# Patient Record
Sex: Female | Born: 1940 | Race: White | Hispanic: No | Marital: Married | Smoking: Current every day smoker
Health system: Southern US, Community
[De-identification: ages and names within clinical notes are randomized; demographics above are authoritative.]

## PROBLEM LIST (undated history)

## (undated) DIAGNOSIS — K219 Gastro-esophageal reflux disease without esophagitis: Secondary | ICD-10-CM

## (undated) DIAGNOSIS — Z9221 Personal history of antineoplastic chemotherapy: Secondary | ICD-10-CM

## (undated) DIAGNOSIS — M1990 Unspecified osteoarthritis, unspecified site: Secondary | ICD-10-CM

## (undated) DIAGNOSIS — T7840XA Allergy, unspecified, initial encounter: Secondary | ICD-10-CM

## (undated) DIAGNOSIS — Z923 Personal history of irradiation: Secondary | ICD-10-CM

## (undated) DIAGNOSIS — C50919 Malignant neoplasm of unspecified site of unspecified female breast: Secondary | ICD-10-CM

## (undated) DIAGNOSIS — K5792 Diverticulitis of intestine, part unspecified, without perforation or abscess without bleeding: Secondary | ICD-10-CM

## (undated) HISTORY — PX: BREAST BIOPSY: SHX20

## (undated) HISTORY — PX: TONSILLECTOMY: SUR1361

## (undated) HISTORY — PX: ABDOMINAL HYSTERECTOMY: SHX81

## (undated) HISTORY — PX: BREAST LUMPECTOMY: SHX2

## (undated) HISTORY — PX: KNEE ARTHROSCOPY: SHX127

## (undated) HISTORY — DX: Unspecified osteoarthritis, unspecified site: M19.90

## (undated) HISTORY — PX: APPENDECTOMY: SHX54

## (undated) HISTORY — DX: Allergy, unspecified, initial encounter: T78.40XA

## (undated) HISTORY — PX: HAND SURGERY: SHX662

---

## 1998-07-17 ENCOUNTER — Ambulatory Visit (HOSPITAL_COMMUNITY): Admission: RE | Admit: 1998-07-17 | Discharge: 1998-07-17 | Payer: Self-pay | Admitting: Gastroenterology

## 1998-10-28 ENCOUNTER — Ambulatory Visit (HOSPITAL_COMMUNITY): Admission: RE | Admit: 1998-10-28 | Discharge: 1998-10-28 | Payer: Self-pay | Admitting: Gastroenterology

## 1998-10-28 ENCOUNTER — Encounter: Payer: Self-pay | Admitting: Gastroenterology

## 2004-07-08 ENCOUNTER — Encounter: Admission: RE | Admit: 2004-07-08 | Discharge: 2004-07-08 | Payer: Self-pay | Admitting: Sports Medicine

## 2005-08-09 ENCOUNTER — Encounter: Admission: RE | Admit: 2005-08-09 | Discharge: 2005-08-09 | Payer: Self-pay | Admitting: Neurology

## 2007-04-05 ENCOUNTER — Ambulatory Visit (HOSPITAL_BASED_OUTPATIENT_CLINIC_OR_DEPARTMENT_OTHER): Admission: RE | Admit: 2007-04-05 | Discharge: 2007-04-06 | Payer: Self-pay | Admitting: Orthopedic Surgery

## 2008-10-13 HISTORY — PX: COLON SURGERY: SHX602

## 2010-07-06 NOTE — Op Note (Signed)
NAME:  Marilyn Dalton, Marilyn Dalton NO.:  [id number]   MEDICAL RECORD NO.:  [id number]          PATIENT TYPE:  AMB   LOCATION:  DSC                          FACILITY:  [facility]   PHYSICIAN:  Dionne Ano. 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   PROCEDURE:  1. CMC arthroplasty right basilar thumb joint with trapezium excision.  2. Right thumb abductor pollicis longus digastric portion tendon      transfer to the first metacarpal FCR and back upon itself with      multiple throws (Zancolli tendon transfer) right basilar thumb      joint.  3. Abductor pollicis longus proper portion tendon transfer to the FCR      and APL proper and back upon themselves with multiple figure-of-      eight throws (Weilby tendon transfer) right basilar thumb joint.  4. Abductor pollicis longus tenodesis (shortening of her wrist      extensor at the wrist forearm level to prevent dorsolateral escape)      right basilar thumb joint.   SURGEON:  Dionne Ano. Amanda Pea, M.D.   ASSISTANT:  Karie Chimera, P.A.-C.   COMPLICATIONS:  None.   ANESTHESIA:  General with preoperative block.   TOURNIQUET TIME:  Less than an hour.   INDICATIONS FOR PROCEDURE:  This patient is a pleasant female who is 70  years of age and presents with the above mentioned diagnosis.  I have  counseled her with regards to the risks and benefits of surgery  including the risks of infection, bleeding, anesthesia, damage to normal  structures, and failure of the surgery to accomplish its intended goals  of relieving symptoms and  restoring function. With this in mind, she  desires to proceed.  All questions have been encouraged and answered  preoperatively.   OPERATIVE PROCEDURE IN DETAIL:  The patient was seen by myself and  anesthesia, she was taken to the operating suite, and underwent the  smooth induction of general anesthesia.  A preop block was in excellent  working fashion.  She was given preoperative antibiotics.  Her arm was  prepped and draped in the usual sterile fashion with Betadine scrub and  paint.  The patient had a time out called, the arm had been marked, and  permit was signed and verified.  The operation then commenced with a  dorsal radial incision. Dissection was carried down. The EPL, EPB, and  APL tendons were palpated. I then created an interval between the EPB  and APL.  I very carefully protected the superficial radial nerve branch  and the radial artery which was dissected and kept out of harm's way.  The capsule was incised, a Therapist, nutritional placed on either side of  the  joint.  Following this, the trapezium was excised piecemeal.  FCR  tenolysis and tenosynovectomy was then accomplished without difficulty  and following this, I created a drill hole dorsal to palmar exiting  intra-articularly in line with the palmar beak ligament.  This was  enlarged to a 32 drill bit.  Following this, I then irrigated copiously  and this completed the trapezium excision/arthroplasty portion of the  procedure.   Following this, a transverse incision was made about the dorsal radial  distal third of the forearm.  Dissection was carried down. The digastric  portion of the EPL and the APL 1/3 proper portion were harvested.  These  were then retrieved distally at the base of the metacarpal.  Once this  was done, I then performed APL digastric portion tendon transfer to the  first metacarpal via the drill hole dorsal to palmarly.  I then placed  it around the FCR twice and through a small slit in the FCR  and then  back upon itself adjacent to the first metacarpal.  This was inset with  FiberWire and completed the Zancolli tendon transfer.  Following this, I  then placed the 1/3 proper portion APL strip through the FCR and back  through the APL proper with multiple figure-of-eight throws.  I then  secured this with FiberWire and this completed the Weilby tendon  transfer.   Following this, I then irrigated copiously, anchovied a small remnant of  tendon that was not needed into the space created and noted that the  patient had an nice tendon transfer.  Following this, I then performed a  very careful and cautious APL tenodesis.  This was performed without  difficulty. Tenodesis of the APL tendon was accomplished without  difficulty to prevent dorsolateral escape.  This was shortening of her  wrist extensor at the wrist forearm level.  Following this, I then  performed copious irrigation followed by deflation of the tourniquet.  I  obtained hemostasis with bipolar electrocautery, closed with Prolene,  she was placed in a thumb spica splint.  She will be admitted for IV  antibiotics, general postop observation, and pain management, and return  to the office to see [place] in 12 days for the standard postop protocol.  It  has been an absolute pleasure to see her and treat her today and we wish  her the best for her postoperative period.           ______________________________  Dionne Ano. Everlene Other, M.D.     Nash Mantis  D:  04/05/2007  T:  04/06/2007  Job:  [id number]

## 2010-11-12 LAB — POCT HEMOGLOBIN-HEMACUE: Hemoglobin: 15.9 — ABNORMAL HIGH

## 2013-06-20 ENCOUNTER — Ambulatory Visit: Payer: Medicare Other

## 2013-06-20 ENCOUNTER — Ambulatory Visit (INDEPENDENT_AMBULATORY_CARE_PROVIDER_SITE_OTHER): Payer: Medicare Other | Admitting: Internal Medicine

## 2013-06-20 VITALS — BP 122/84 | HR 90 | Temp 98.0°F | Resp 16 | Ht 65.0 in | Wt 114.8 lb

## 2013-06-20 DIAGNOSIS — R05 Cough: Secondary | ICD-10-CM

## 2013-06-20 DIAGNOSIS — R059 Cough, unspecified: Secondary | ICD-10-CM

## 2013-06-20 DIAGNOSIS — F17200 Nicotine dependence, unspecified, uncomplicated: Secondary | ICD-10-CM

## 2013-06-20 DIAGNOSIS — R079 Chest pain, unspecified: Secondary | ICD-10-CM

## 2013-06-20 DIAGNOSIS — J45909 Unspecified asthma, uncomplicated: Secondary | ICD-10-CM

## 2013-06-20 LAB — POCT CBC
Granulocyte percent: 60.4 %G (ref 37–80)
HCT, POC: 48 % — AB (ref 37.7–47.9)
Hemoglobin: 15.9 g/dL (ref 12.2–16.2)
Lymph, poc: 2.3 (ref 0.6–3.4)
MCH, POC: 34 pg — AB (ref 27–31.2)
MCHC: 33.1 g/dL (ref 31.8–35.4)
MCV: 102.6 fL — AB (ref 80–97)
MID (cbc): 0.4 (ref 0–0.9)
MPV: 8.2 fL (ref 0–99.8)
POC Granulocyte: 4.2 (ref 2–6.9)
POC LYMPH PERCENT: 33.5 %L (ref 10–50)
POC MID %: 6.1 %M (ref 0–12)
Platelet Count, POC: 324 10*3/uL (ref 142–424)
RBC: 4.68 M/uL (ref 4.04–5.48)
RDW, POC: 13.9 %
WBC: 7 10*3/uL (ref 4.6–10.2)

## 2013-06-20 MED ORDER — PREDNISONE 10 MG PO TABS: ORAL_TABLET | ORAL | Status: DC

## 2013-06-20 MED ORDER — IPRATROPIUM BROMIDE 0.02 % IN SOLN
0.5000 mg | Freq: Once | RESPIRATORY_TRACT | Status: AC
  Administered 2013-06-20: 0.5 mg via RESPIRATORY_TRACT

## 2013-06-20 MED ORDER — HYDROCODONE-ACETAMINOPHEN 7.5-325 MG/15ML PO SOLN: 5.0000 mL | Freq: Four times a day (QID) | ORAL | Status: DC | PRN

## 2013-06-20 MED ORDER — AZITHROMYCIN 500 MG PO TABS: 500.0000 mg | ORAL_TABLET | Freq: Every day | ORAL | Status: DC

## 2013-06-20 MED ORDER — ALBUTEROL SULFATE (2.5 MG/3ML) 0.083% IN NEBU
2.5000 mg | INHALATION_SOLUTION | Freq: Once | RESPIRATORY_TRACT | Status: AC
  Administered 2013-06-20: 2.5 mg via RESPIRATORY_TRACT

## 2013-06-20 NOTE — Patient Instructions (Addendum)
Sinusitis Sinusitis is redness, soreness, and swelling (inflammation) of the paranasal sinuses. Paranasal sinuses are air pockets within the bones of your face (beneath the eyes, the middle of the forehead, or above the eyes). In healthy paranasal sinuses, mucus is able to drain out, and air is able to circulate through them by way of your nose. However, when your paranasal sinuses are inflamed, mucus and air can become trapped. This can allow bacteria and other germs to grow and cause infection. Sinusitis can develop quickly and last only a short time (acute) or continue over a long period (chronic). Sinusitis that lasts for more than 12 weeks is considered chronic.  CAUSES  Causes of sinusitis include:  Allergies.  Structural abnormalities, such as displacement of the cartilage that separates your nostrils (deviated septum), which can decrease the air flow through your nose and sinuses and affect sinus drainage.  Functional abnormalities, such as when the small hairs (cilia) that line your sinuses and help remove mucus do not work properly or are not present. SYMPTOMS  Symptoms of acute and chronic sinusitis are the same. The primary symptoms are pain and pressure around the affected sinuses. Other symptoms include:  Upper toothache.  Earache.  Headache.  Bad breath.  Decreased sense of smell and taste.  A cough, which worsens when you are lying flat.  Fatigue.  Fever.  Thick drainage from your nose, which often is green and may contain pus (purulent).  Swelling and warmth over the affected sinuses. DIAGNOSIS  Your caregiver will perform a physical exam. During the exam, your caregiver may:  Look in your nose for signs of abnormal growths in your nostrils (nasal polyps).  Tap over the affected sinus to check for signs of infection.  View the inside of your sinuses (endoscopy) with a special imaging device with a light attached (endoscope), which is inserted into your  sinuses. If your caregiver suspects that you have chronic sinusitis, one or more of the following tests may be recommended:  Allergy tests.  Nasal culture A sample of mucus is taken from your nose and sent to a lab and screened for bacteria.  Nasal cytology A sample of mucus is taken from your nose and examined by your caregiver to determine if your sinusitis is related to an allergy. TREATMENT  Most cases of acute sinusitis are related to a viral infection and will resolve on their own within 10 days. Sometimes medicines are prescribed to help relieve symptoms (pain medicine, decongestants, nasal steroid sprays, or saline sprays).  However, for sinusitis related to a bacterial infection, your caregiver will prescribe antibiotic medicines. These are medicines that will help kill the bacteria causing the infection.  Rarely, sinusitis is caused by a fungal infection. In theses cases, your caregiver will prescribe antifungal medicine. For some cases of chronic sinusitis, surgery is needed. Generally, these are cases in which sinusitis recurs more than 3 times per year, despite other treatments. HOME CARE INSTRUCTIONS   Drink plenty of water. Water helps thin the mucus so your sinuses can drain more easily.  Use a humidifier.  Inhale steam 3 to 4 times a day (for example, sit in the bathroom with the shower running).  Apply a warm, moist washcloth to your face 3 to 4 times a day, or as directed by your caregiver.  Use saline nasal sprays to help moisten and clean your sinuses.  Take over-the-counter or prescription medicines for pain, discomfort, or fever only as directed by your caregiver. SEEK IMMEDIATE MEDICAL   CARE IF:  You have increasing pain or severe headaches.  You have nausea, vomiting, or drowsiness.  You have swelling around your face.  You have vision problems.  You have a stiff neck.  You have difficulty breathing. MAKE SURE YOU:   Understand these  instructions.  Will watch your condition.  Will get help right away if you are not doing well or get worse. Document Released: 02/07/2005 Document Revised: 05/02/2011 Document Reviewed: 02/22/2011 [facility] Patient Information 2014 [place], [place]. Acute Bronchitis Bronchitis is inflammation of the airways that extend from the windpipe into the lungs (bronchi). The inflammation often causes mucus to develop. This leads to a cough, which is the most common symptom of bronchitis.  In acute bronchitis, the condition usually develops suddenly and goes away over time, usually in a couple weeks. Smoking, allergies, and asthma can make bronchitis worse. Repeated episodes of bronchitis may cause further lung problems.  CAUSES Acute bronchitis is most often caused by the same virus that causes a cold. The virus can spread from person to person (contagious).  SIGNS AND SYMPTOMS   Cough.   Fever.   Coughing up mucus.   Body aches.   Chest congestion.   Chills.   Shortness of breath.   Sore throat.  DIAGNOSIS  Acute bronchitis is usually diagnosed through a physical exam. Tests, such as chest X-rays, are sometimes done to rule out other conditions.  TREATMENT  Acute bronchitis usually goes away in a couple weeks. Often times, no medical treatment is necessary. Medicines are sometimes given for relief of fever or cough. Antibiotics are usually not needed but may be prescribed in certain situations. In some cases, an inhaler may be recommended to help reduce shortness of breath and control the cough. A cool mist vaporizer may also be used to help thin bronchial secretions and make it easier to clear the chest.  HOME CARE INSTRUCTIONS  Get plenty of rest.   Drink enough fluids to keep your urine clear or pale yellow (unless you have a medical condition that requires fluid restriction). Increasing fluids may help thin your secretions and will prevent dehydration.   Only take  over-the-counter or prescription medicines as directed by your health care provider.   Avoid smoking and secondhand smoke. Exposure to cigarette smoke or irritating chemicals will make bronchitis worse. If you are a smoker, consider using nicotine gum or skin patches to help control withdrawal symptoms. Quitting smoking will help your lungs heal faster.   Reduce the chances of another bout of acute bronchitis by washing your hands frequently, avoiding people with cold symptoms, and trying not to touch your hands to your mouth, nose, or eyes.   Follow up with your health care provider as directed.  SEEK MEDICAL CARE IF: Your symptoms do not improve after 1 week of treatment.  SEEK IMMEDIATE MEDICAL CARE IF:  You develop an increased fever or chills.   You have chest pain.   You have severe shortness of breath.  You have bloody sputum.   You develop dehydration.  You develop fainting.  You develop repeated vomiting.  You develop a severe headache. MAKE SURE YOU:   Understand these instructions.  Will watch your condition.  Will get help right away if you are not doing well or get worse. Document Released: 03/17/2004 Document Revised: 10/10/2012 Document Reviewed: 07/31/2012 [facility] Patient Information 2014 [place]. Smoking Cessation Quitting smoking is important to your health and has many advantages. However, it is not always easy to  quit since nicotine is a very addictive drug. Often times, people try 3 times or more before being able to quit. This document explains the best ways for you to prepare to quit smoking. Quitting takes hard work and a lot of effort, but you can do it. ADVANTAGES OF QUITTING SMOKING  You will live longer, feel better, and live better.  Your body will feel the impact of quitting smoking almost immediately.  Within 20 minutes, blood pressure decreases. Your pulse returns to its normal level.  After 8 hours, carbon monoxide levels  in the blood return to normal. Your oxygen level increases.  After 24 hours, the chance of having a heart attack starts to decrease. Your breath, hair, and body stop smelling like smoke.  After 48 hours, damaged nerve endings begin to recover. Your sense of taste and smell improve.  After 72 hours, the body is virtually free of nicotine. Your bronchial tubes relax and breathing becomes easier.  After 2 to 12 weeks, lungs can hold more air. Exercise becomes easier and circulation improves.  The risk of having a heart attack, stroke, cancer, or lung disease is greatly reduced.  After 1 year, the risk of coronary heart disease is cut in half.  After 5 years, the risk of stroke falls to the same as a nonsmoker.  After 10 years, the risk of lung cancer is cut in half and the risk of other cancers decreases significantly.  After 15 years, the risk of coronary heart disease drops, usually to the level of a nonsmoker.  If you are pregnant, quitting smoking will improve your chances of having a healthy baby.  The people you live with, especially any children, will be healthier.  You will have extra money to spend on things other than cigarettes. QUESTIONS TO THINK ABOUT BEFORE ATTEMPTING TO QUIT You may want to talk about your answers with your caregiver.  Why do you want to quit?  If you tried to quit in the past, what helped and what did not?  What will be the most difficult situations for you after you quit? How will you plan to handle them?  Who can help you through the tough times? Your family? Friends? A caregiver?  What pleasures do you get from smoking? What ways can you still get pleasure if you quit? Here are some questions to ask your caregiver:  How can you help me to be successful at quitting?  What medicine do you think would be best for me and how should I take it?  What should I do if I need more help?  What is smoking withdrawal like? How can I get information on  withdrawal? GET READY  Set a quit date.  Change your environment by getting rid of all cigarettes, ashtrays, matches, and lighters in your home, car, or work. Do not let people smoke in your home.  Review your past attempts to quit. Think about what worked and what did not. GET SUPPORT AND ENCOURAGEMENT You have a better chance of being successful if you have help. You can get support in many ways.  Tell your family, friends, and co-workers that you are going to quit and need their support. Ask them not to smoke around you.  Get individual, group, or telephone counseling and support. Programs are available at [employer] and health centers. Call your local health department for information about programs in your area.  Spiritual beliefs and practices may help some smokers quit.  Download a "  quit meter" on your computer to keep track of quit statistics, such as how long you have gone without smoking, cigarettes not smoked, and money saved.  Get a self-help book about quitting smoking and staying off of tobacco. [place] yourself from urges to smoke. Talk to someone, go for a walk, or occupy your time with a task.  Change your normal routine. Take a different route to work. Drink tea instead of coffee. Eat breakfast in a different place.  Reduce your stress. Take a hot bath, exercise, or read a book.  Plan something enjoyable to do every day. Reward yourself for not smoking.  Explore interactive web-based programs that specialize in helping you quit. GET MEDICINE AND USE IT CORRECTLY Medicines can help you stop smoking and decrease the urge to smoke. Combining medicine with the above behavioral methods and support can greatly increase your chances of successfully quitting smoking.  Nicotine replacement therapy helps deliver nicotine to your body without the negative effects and risks of smoking. Nicotine replacement therapy includes nicotine gum,  lozenges, inhalers, nasal sprays, and skin patches. Some may be available over-the-counter and others require a prescription.  Antidepressant medicine helps people abstain from smoking, but how this works is unknown. This medicine is available by prescription.  Nicotinic receptor partial agonist medicine simulates the effect of nicotine in your brain. This medicine is available by prescription. Ask your caregiver for advice about which medicines to use and how to use them based on your health history. Your caregiver will tell you what side effects to look out for if you choose to be on a medicine or therapy. Carefully read the information on the package. Do not use any other product containing nicotine while using a nicotine replacement product.  RELAPSE OR DIFFICULT SITUATIONS Most relapses occur within the first 3 months after quitting. Do not be discouraged if you start smoking again. Remember, most people try several times before finally quitting. You may have symptoms of withdrawal because your body is used to nicotine. You may crave cigarettes, be irritable, feel very hungry, cough often, get headaches, or have difficulty concentrating. The withdrawal symptoms are only temporary. They are strongest when you first quit, but they will go away within 10 14 days. To reduce the chances of relapse, try to:  Avoid drinking alcohol. Drinking lowers your chances of successfully quitting.  Reduce the amount of caffeine you consume. Once you quit smoking, the amount of caffeine in your body increases and can give you symptoms, such as a rapid heartbeat, sweating, and anxiety.  Avoid smokers because they can make you want to smoke.  Do not let weight gain distract you. Many smokers will gain weight when they quit, usually less than 10 pounds. Eat a healthy diet and stay active. You can always lose the weight gained after you quit.  Find ways to improve your mood other than smoking. FOR MORE INFORMATION   www.smokefree.gov  Document Released: 02/01/2001 Document Revised: 08/09/2011 Document Reviewed: 05/19/2011 [facility] Patient Information 2014 [place], [place]. Chronic Obstructive Pulmonary Disease Chronic obstructive pulmonary disease (COPD) is a common lung condition in which airflow from the lungs is limited. COPD is a general term that can be used to describe many different lung problems that limit airflow, including both chronic bronchitis and emphysema. If you have COPD, your lung function will probably never return to normal, but there are measures you can take to improve lung function and make yourself feel better.  CAUSES  Smoking (common).   Exposure to secondhand smoke.   Genetic problems.  Chronic inflammatory lung diseases or recurrent infections. SYMPTOMS   Shortness of breath, especially with physical activity.   Deep, persistent (chronic) cough with a large amount of thick mucus.   Wheezing.   Rapid breaths (tachypnea).   Gray or bluish discoloration (cyanosis) of the skin, especially in fingers, toes, or lips.   Fatigue.   Weight loss.   Frequent infections or episodes when breathing symptoms become much worse (exacerbations).   Chest tightness. DIAGNOSIS  Your healthcare provider will take a medical history and perform a physical examination to make the initial diagnosis. Additional tests for COPD may include:   Lung (pulmonary) function tests.  Chest X-ray.  CT scan.  Blood tests. TREATMENT  Treatment available to help you feel better when you have COPD include:   Inhaler and nebulizer medicines. These help manage the symptoms of COPD and make your breathing more comfortable  Supplemental oxygen. Supplemental oxygen is only helpful if you have a low oxygen level in your blood.   Exercise and physical activity. These are beneficial for nearly all people with COPD. Some people may also benefit from a pulmonary rehabilitation  program. HOME CARE INSTRUCTIONS   Take all medicines (inhaled or pills) as directed by your health care provider.  Only take over-the-counter or prescription medicines for pain, fever, or discomfort as directed by your health care provider.   Avoid over-the-counter medicines or cough syrups that dry up your airway (such as antihistamines) and slow down the elimination of secretions unless instructed otherwise by your healthcare provider.   If you are a smoker, the most important thing that you can do is stop smoking. Continuing to smoke will cause further lung damage and breathing trouble. Ask your health care provider for help with quitting smoking. He or she can direct you to community resources or hospitals that provide support.  Avoid exposure to irritants such as smoke, chemicals, and fumes that aggravate your breathing.  Use oxygen therapy and pulmonary rehabilitation if directed by your health care provider. If you require home oxygen therapy, ask your healthcare provider whether you should purchase a pulse oximeter to measure your oxygen level at home.   Avoid contact with individuals who have a contagious illness.  Avoid extreme temperature and humidity changes.  Eat healthy foods. Eating smaller, more frequent meals and resting before meals may help you maintain your strength.  Stay active, but balance activity with periods of rest. Exercise and physical activity will help you maintain your ability to do things you want to do.  Preventing infection and hospitalization is very important when you have COPD. Make sure to receive all the vaccines your health care provider recommends, especially the pneumococcal and influenza vaccines. Ask your healthcare provider whether you need a pneumonia vaccine.  Learn and use relaxation techniques to manage stress.  Learn and use controlled breathing techniques as directed by your health care provider. Controlled breathing techniques  include:   Pursed lip breathing. Start by breathing in (inhaling) through your nose for 1 second. Then, purse your lips as if you were going to whistle and breathe out (exhale) through the pursed lips for 2 seconds.   Diaphragmatic breathing. Start by putting one hand on your abdomen just above your waist. Inhale slowly through your nose. The hand on your abdomen should move out. Then purse your lips and exhale slowly. You should be able to feel the hand on your abdomen moving  in as you exhale.   Learn and use controlled coughing to clear mucus from your lungs. Controlled coughing is a series of short, progressive coughs. The steps of controlled coughing are:  1. Lean your head slightly forward.  2. Breathe in deeply using diaphragmatic breathing.  3. Try to hold your breath for 3 seconds.  4. Keep your mouth slightly open while coughing twice.  5. Spit any mucus out into a tissue.  6. Rest and repeat the steps once or twice as needed. SEEK MEDICAL CARE IF:   You are coughing up more mucus than usual.   There is a change in the color or thickness of your mucus.   Your breathing is more labored than usual.   Your breathing is faster than usual.  SEEK IMMEDIATE MEDICAL CARE IF:   You have shortness of breath while you are resting.   You have shortness of breath that prevents you from:  Being able to talk.   Performing your usual physical activities.   You have chest pain lasting longer than 5 minutes.   Your skin color is more cyanotic than usual.  You measure low oxygen saturations for longer than 5 minutes with a pulse oximeter. MAKE SURE YOU:   Understand these instructions.  Will watch your condition.  Will get help right away if you are not doing well or get worse. Document Released: 11/17/2004 Document Revised: 11/28/2012 Document Reviewed: 10/04/2012 [facility] Patient Information 2014 [place], [place].

## 2013-06-20 NOTE — Progress Notes (Signed)
   Subjective:    Patient ID: Marilyn Dalton, female    DOB: Nov 17, 1940, 73 y.o.   MRN: [id number]  HPI 73 year old female complains of head and chest congestion x 12 days. Coughing up yellow mucus. Has had mild fever on on and off. She smokes a pack a day of cigarettes. Left ear is sore. Sinus pain and pressure. Has chest pain with coughing and wheezing. No hx of heart disease, diabetes, or htn. Works out every day.   Review of Systems     Objective:   Physical Exam  Vitals reviewed. Constitutional: She is oriented to person, place, and time. She appears well-developed and well-nourished. No distress.  HENT:  Head: Normocephalic.  Right Ear: External ear normal.  Left Ear: External ear normal.  Nose: Mucosal edema, rhinorrhea and sinus tenderness present. No nasal deformity. No epistaxis. Right sinus exhibits maxillary sinus tenderness. Right sinus exhibits no frontal sinus tenderness. Left sinus exhibits maxillary sinus tenderness. Left sinus exhibits no frontal sinus tenderness.  Mouth/Throat: Uvula is midline. Posterior oropharyngeal erythema present.  Eyes: EOM are normal.  Neck: Normal range of motion. Neck supple.  Cardiovascular: Normal rate, regular rhythm and normal heart sounds.   Pulmonary/Chest: Effort normal. Not tachypneic. She has decreased breath sounds. She has wheezes. She has rhonchi. She has no rales. She exhibits tenderness.  Musculoskeletal: Normal range of motion.  Neurological: She is alert and oriented to person, place, and time. She exhibits normal muscle tone. Coordination normal.  Psychiatric: She has a normal mood and affect. Her behavior is normal.   [facility] reading (PRIMARY) by  Dr Elder Cyphers no infiltrate, hyper inflation suggestive of copd  Results for orders placed in visit on 06/20/13  POCT CBC      Result Value Ref Range   WBC 7.0  4.6 - 10.2 K/uL   Lymph, poc 2.3  0.6 - 3.4   POC LYMPH PERCENT 33.5  10 - 50 %L   MID (cbc) 0.4  0 - 0.9   POC MID %  6.1  0 - 12 %M   POC Granulocyte 4.2  2 - 6.9   Granulocyte percent 60.4  37 - 80 %G   RBC 4.68  4.04 - 5.48 M/uL   Hemoglobin 15.9  12.2 - 16.2 g/dL   HCT, POC 48.0 (*) 37.7 - 47.9 %   MCV 102.6 (*) 80 - 97 fL   MCH, POC 34.0 (*) 27 - 31.2 pg   MCHC 33.1  31.8 - 35.4 g/dL   RDW, POC 13.9     Platelet Count, POC 324  142 - 424 K/uL   MPV 8.2  0 - 99.8 fL   PFR 300 l/min Nebulizer tx Post neb 300l/min        Assessment & Plan:  Zithromax 538mf 5d/Lortab elixir/Prednisone Smoking cessation See dr. Tollie Pizza to evaluate lungs further

## 2014-06-09 ENCOUNTER — Other Ambulatory Visit: Payer: Self-pay | Admitting: Family Medicine

## 2014-06-09 DIAGNOSIS — N6315 Unspecified lump in the right breast, overlapping quadrants: Secondary | ICD-10-CM

## 2014-06-09 DIAGNOSIS — N6310 Unspecified lump in the right breast, unspecified quadrant: Principal | ICD-10-CM

## 2014-06-12 ENCOUNTER — Ambulatory Visit
Admission: RE | Admit: 2014-06-12 | Discharge: 2014-06-12 | Disposition: A | Discharge status: Home / Self Care | Payer: Medicare Other | Source: Ambulatory Visit | Attending: Family Medicine | Admitting: Family Medicine

## 2014-06-12 ENCOUNTER — Other Ambulatory Visit: Payer: Self-pay | Admitting: Family Medicine

## 2014-06-12 DIAGNOSIS — N6315 Unspecified lump in the right breast, overlapping quadrants: Secondary | ICD-10-CM

## 2014-06-12 DIAGNOSIS — N6310 Unspecified lump in the right breast, unspecified quadrant: Principal | ICD-10-CM

## 2014-06-16 ENCOUNTER — Other Ambulatory Visit: Payer: Self-pay | Admitting: Family Medicine

## 2014-06-16 DIAGNOSIS — N6310 Unspecified lump in the right breast, unspecified quadrant: Principal | ICD-10-CM

## 2014-06-16 DIAGNOSIS — N6315 Unspecified lump in the right breast, overlapping quadrants: Secondary | ICD-10-CM

## 2014-06-20 ENCOUNTER — Ambulatory Visit
Admission: RE | Admit: 2014-06-20 | Discharge: 2014-06-20 | Disposition: A | Discharge status: Home / Self Care | Payer: Medicare Other | Source: Ambulatory Visit | Attending: Family Medicine | Admitting: Family Medicine

## 2014-06-20 DIAGNOSIS — N6310 Unspecified lump in the right breast, unspecified quadrant: Principal | ICD-10-CM

## 2014-06-20 DIAGNOSIS — N6315 Unspecified lump in the right breast, overlapping quadrants: Secondary | ICD-10-CM

## 2014-06-20 DIAGNOSIS — C50919 Malignant neoplasm of unspecified site of unspecified female breast: Secondary | ICD-10-CM

## 2014-06-20 HISTORY — DX: Malignant neoplasm of unspecified site of unspecified female breast: C50.919

## 2014-06-20 HISTORY — PX: BREAST BIOPSY: SHX20

## 2014-06-23 ENCOUNTER — Other Ambulatory Visit: Payer: Self-pay | Admitting: Family Medicine

## 2014-06-23 DIAGNOSIS — D0511 Intraductal carcinoma in situ of right breast: Secondary | ICD-10-CM

## 2014-06-23 DIAGNOSIS — C50911 Malignant neoplasm of unspecified site of right female breast: Secondary | ICD-10-CM

## 2014-06-30 ENCOUNTER — Ambulatory Visit
Admission: RE | Admit: 2014-06-30 | Discharge: 2014-06-30 | Disposition: A | Discharge status: Home / Self Care | Payer: Medicare Other | Source: Ambulatory Visit | Attending: Family Medicine | Admitting: Family Medicine

## 2014-06-30 DIAGNOSIS — C50911 Malignant neoplasm of unspecified site of right female breast: Secondary | ICD-10-CM

## 2014-06-30 DIAGNOSIS — D0511 Intraductal carcinoma in situ of right breast: Secondary | ICD-10-CM

## 2014-06-30 MED ORDER — GADOBENATE DIMEGLUMINE 529 MG/ML IV SOLN
9.0000 mL | Freq: Once | INTRAVENOUS | Status: AC | PRN
  Administered 2014-06-30: 9 mL via INTRAVENOUS

## 2014-07-02 ENCOUNTER — Telehealth: Payer: Self-pay | Admitting: *Deleted

## 2014-07-02 ENCOUNTER — Other Ambulatory Visit: Payer: Self-pay | Admitting: General Surgery

## 2014-07-02 NOTE — Telephone Encounter (Signed)
Called pt and confirmed 07/04/14 med onc appt.  Unable to mail before appt letter - gave verbal.  Unable to mail welcoming packet - gave directions and instructions.  Unable to mail intake form - placed a note for one to be given at time of check in.  Emailed Engineer, civil (consulting) at [employer] to make her aware.  Placed a copy of records in Dr. Geralyn Flash box and took on to HIM to scan.

## 2014-07-03 DIAGNOSIS — C50211 Malignant neoplasm of upper-inner quadrant of right female breast: Secondary | ICD-10-CM | POA: Insufficient documentation

## 2014-07-03 NOTE — Progress Notes (Signed)
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  Receptor Status: ER(+90% ), PR ( neg 1%,) Her2-neu ( neg.+ Ki-67 20% )  Did patient present with symptoms (if so, please note symptoms) or was this found on screening mammography?: patient noticed lump in breast 2 months,   Past/Anticipated interventions by surgeon, if any: to see Dr. [name]  For port acath placement this Friday 07/11/14  Past/Anticipated interventions by medical oncology, if any: Chemotherapy : Dr. Jennette Kettle , MD seen 07/04/14, Chemo education done 07/08/14 and Echocardiogram as well, follow up 07/14/14 , Chemotherapy to start Monday 07/14/14   Lymphedema issues, if any:   none  Pain issues, if any:  none  SAFETY ISSUES:  Prior radiation? No  Pacemaker/ICD? NO  Possible current pregnancy? NO  Is the patient on methotrexate?NO  Current Complaints / other details:  Married, GxP4,  Menarche age 47 , 50st live birth age 62, Birth control pills 20 years,no HRT,  Current  Cigarette  Smoker, 1/ppd, 40 years, smokeless tobacco, drinks alcohol, occasionally Allergies: NKA  Roslynn Amble, Felicita Gage, RN 07/03/2014,5:35 PM

## 2014-07-03 NOTE — Assessment & Plan Note (Signed)
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 After reviewing the pathology in detail, we proceeded to discuss the different treatment options between surgery, radiation, chemotherapy, antiestrogen therapies.  Recommendation based on multidisciplinary tumor board: 1. Neoadjuvant chemotherapy with Abraxane Herceptin weekly 12 followed by Herceptin maintenance every 3 weeks for 1 year 2. After initial 12 weeks, breast surgery 3. Followed by radiation therapy 4. Followed by antiestrogen therapy with aromatase inhibitor  Chemotherapy counseling: I discussed risks and benefits of chemotherapy including the Herceptin related reversible cardiomyopathy risk, diarrhea, risk of Abraxane from neuropathy, decrease in blood counts, nausea vomiting, hair loss as well as changes in liver and kidney function. Patient understands this risk and consented to proceed with the treatment.  Plan: 1. Echocardiogram 2. Chemotherapy class 3. Port placement And then start chemotherapy  Return to clinic with the start of chemotherapy.

## 2014-07-04 ENCOUNTER — Ambulatory Visit: Payer: Medicare Other

## 2014-07-04 ENCOUNTER — Telehealth: Payer: Self-pay | Admitting: Hematology and Oncology

## 2014-07-04 ENCOUNTER — Ambulatory Visit (HOSPITAL_BASED_OUTPATIENT_CLINIC_OR_DEPARTMENT_OTHER): Payer: Medicare Other | Admitting: Hematology and Oncology

## 2014-07-04 ENCOUNTER — Encounter: Payer: Self-pay | Admitting: *Deleted

## 2014-07-04 ENCOUNTER — Other Ambulatory Visit: Payer: Self-pay | Admitting: *Deleted

## 2014-07-04 ENCOUNTER — Encounter: Payer: Self-pay | Admitting: Hematology and Oncology

## 2014-07-04 VITALS — BP 144/92 | HR 85 | Temp 98.0°F | Resp 18 | Ht 65.0 in | Wt 107.5 lb

## 2014-07-04 DIAGNOSIS — C50211 Malignant neoplasm of upper-inner quadrant of right female breast: Secondary | ICD-10-CM

## 2014-07-04 DIAGNOSIS — C50811 Malignant neoplasm of overlapping sites of right female breast: Secondary | ICD-10-CM

## 2014-07-04 DIAGNOSIS — Z9189 Other specified personal risk factors, not elsewhere classified: Secondary | ICD-10-CM

## 2014-07-04 DIAGNOSIS — Z79899 Other long term (current) drug therapy: Secondary | ICD-10-CM

## 2014-07-04 DIAGNOSIS — Z5181 Encounter for therapeutic drug level monitoring: Secondary | ICD-10-CM

## 2014-07-04 NOTE — Progress Notes (Signed)
[facility] CONSULT NOTE  Patient Care Team: No Pcp Per Patient as PCP - General (General Practice)  CHIEF COMPLAINTS/PURPOSE OF CONSULTATION:  Newly diagnosed breast cancer  HISTORY OF PRESENTING ILLNESS:  Marilyn Dalton 74 y.o. female is here because of recent diagnosis of right breast cancer. Patient was sunbathing and felt a lump in the right breast for the last 2 months. A few weeks later she went to see her primary care doctor who referred her for a breast center. She underwent a mammogram that revealed a mass in the right breast and ultrasound. 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 Ki-67 was 20%. She underwent a breast MRI that revealed a 2.4 cm irregular mass in the right breast. She was referred to [place] for consideration for neoadjuvant chemotherapy. She was presented at the multidisciplinary tumor board.  I reviewed her records extensively and collaborated the history with the patient.  SUMMARY OF ONCOLOGIC HISTORY:   Breast cancer of upper-inner quadrant of right female breast   06/20/2014 Initial Diagnosis right breast 3:00: Invasive ductal carcinoma with DCIS, grade 2,ER 90%, PR 1%,HER-2 positive ratio 2.97   06/30/2014 Breast MRI Right breast 2.4 x 2.2 x 2.2 cm irregular mass, no abnormal lymph nodes    In terms of breast cancer risk profile:  She menarched at early age of 1 and went to menopause at age 46  She had 4 pregnancy, her first child was born at age 37  She has received birth control pills for approximately 20 years.  She was never exposed to fertility medications or hormone replacement therapy.  She does not have family history of Breast/GYN/GI cancer  MEDICAL HISTORY:  Past Medical History  Diagnosis Date  . Allergy   . Arthritis     SURGICAL HISTORY: Past Surgical History  Procedure Laterality Date  . Colon surgery      SOCIAL HISTORY: History   Social  History  . Marital Status: Married    Spouse Name: N/A  . Number of Children: N/A  . Years of Education: N/A   Occupational History  . Not on file.   Social History Main Topics  . Smoking status: Current Every Day Smoker  . Smokeless tobacco: Not on file  . Alcohol Use: Yes  . Drug Use: Not on file  . Sexual Activity: Not on file   Other Topics Concern  . Not on file   Social History Narrative  . No narrative on file    FAMILY HISTORY: No family history on file.  ALLERGIES:  has No Known Allergies.  MEDICATIONS:  Current Outpatient Prescriptions  Medication Sig Dispense Refill  . azithromycin (ZITHROMAX) 500 MG tablet Take 1 tablet (500 mg total) by mouth daily. 5 tablet 0  . HYDROcodone-acetaminophen (HYCET) 7.5-325 mg/15 ml solution Take 5 mLs by mouth every 6 (six) hours as needed (or cough). 240 mL 0  . predniSONE (DELTASONE) 10 MG tablet 6-5-4-3-2-1 po pc for breathing 21 tablet 0   No current facility-administered medications for this visit.    REVIEW OF SYSTEMS:   Constitutional: Denies fevers, chills or abnormal night sweats Eyes: Denies blurriness of vision, double vision or watery eyes Ears, nose, mouth, throat, and face: Denies mucositis or sore throat Respiratory: Denies cough, dyspnea or wheezes Cardiovascular: Denies palpitation, chest discomfort or lower extremity swelling Gastrointestinal:  Denies nausea, heartburn or change in bowel habits Skin: Denies abnormal skin rashes  Lymphatics: Denies new lymphadenopathy or easy bruising Neurological:Denies numbness, tingling or new weaknesses Behavioral/Psych: Mood is stable, no new changes  Breast: Palpable lump in the right breast with very small breasts All other systems were reviewed with the patient and are negative.  PHYSICAL EXAMINATION: ECOG PERFORMANCE STATUS: 0 - Asymptomatic  Filed Vitals:   07/04/14 0828  BP: 144/92  Pulse: 85  Temp: 98 F (36.7 C)  Resp: 18   Filed Weights    07/04/14 0828  Weight: 107 lb 8 oz (48.762 kg)    GENERAL:alert, no distress and comfortable SKIN: skin color, texture, turgor are normal, no rashes or significant lesions EYES: normal, conjunctiva are pink and non-injected, sclera clear OROPHARYNX:no exudate, no erythema and lips, buccal mucosa, and tongue normal  NECK: supple, thyroid normal size, non-tender, without nodularity LYMPH:  no palpable lymphadenopathy in the cervical, axillary or inguinal LUNGS: clear to auscultation and percussion with normal breathing effort HEART: regular rate & rhythm and no murmurs and no lower extremity edema ABDOMEN:abdomen soft, non-tender and normal bowel sounds Musculoskeletal:no cyanosis of digits and no clubbing  PSYCH: alert & oriented x 3 with fluent speech NEURO: no focal motor/sensory deficits BREAST: Large palpable mass in the right breast retroareolar area about the upper outer quadrant. No palpable axillary or supraclavicular lymphadenopathy (exam performed in the presence of a chaperone)   LABORATORY DATA:  I have reviewed the data as listed Lab Results  Component Value Date   WBC 7.0 06/20/2013   HGB 15.9 06/20/2013   HCT 48.0* 06/20/2013   MCV 102.6* 06/20/2013   No results found for: NA, K, CL, CO2  RADIOGRAPHIC STUDIES: I have personally reviewed the radiological reports and agreed with the findings in the report. 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 After reviewing the pathology in detail, we proceeded to discuss the different treatment options between surgery, radiation, chemotherapy, antiestrogen therapies.  Recommendation based on multidisciplinary tumor board: 1. Neoadjuvant chemotherapy with Taxotere, carboplatin, Herceptin, Perjeta every 3 weeks 6 cycles followed by Herceptin maintenance every 3 weeks for 1 year 2. After 6 cycles of chemotherapy, breast surgery 3. Followed by radiation therapy 4. Followed by antiestrogen therapy with aromatase inhibitor  Chemotherapy counseling: I discussed risks and benefits of chemotherapy including the Herceptin related reversible cardiomyopathy risk, diarrhea, risk of Abraxane from neuropathy, decrease in blood counts, nausea vomiting, hair loss as well as changes in liver and kidney function. Patient understands this risk and consented to proceed with the treatment.  Plan: 1. Echocardiogram 2. Chemotherapy class 3. Port placement And then start chemotherapy May 23  Return to clinic with the start of chemotherapy.  All questions were answered. The patient knows to call the clinic with any problems, questions or concerns.    Rulon Eisenmenger, MD 8:31 AM

## 2014-07-04 NOTE — Progress Notes (Signed)
Met with patient at new patient visit with Dr. Lindi Adie.  Discussed navigation resources and care plan with patient.  Contact information given and encouraged her to call with any questions or concerns.

## 2014-07-04 NOTE — Progress Notes (Signed)
Checked in new pt with no financial concerns.  Pt has 2 insurances so financial assistance may not be needed but she has my card for any billing questions or concerns.  ° °

## 2014-07-04 NOTE — Telephone Encounter (Signed)
Appointments made and will call patient with all appointments after echo precert

## 2014-07-04 NOTE — Progress Notes (Signed)
Note created by Dr. Gudena during office visit. Copy to patient, original to scan. 

## 2014-07-07 ENCOUNTER — Telehealth: Payer: Self-pay | Admitting: Hematology and Oncology

## 2014-07-07 NOTE — Telephone Encounter (Signed)
Spoke with patient and she is aware of her echo and chemo class

## 2014-07-08 ENCOUNTER — Other Ambulatory Visit: Payer: Medicare Other

## 2014-07-08 ENCOUNTER — Ambulatory Visit (HOSPITAL_COMMUNITY)
Admission: RE | Admit: 2014-07-08 | Discharge: 2014-07-08 | Disposition: A | Discharge status: Home / Self Care | Payer: Medicare Other | Source: Ambulatory Visit | Attending: Hematology and Oncology | Admitting: Hematology and Oncology

## 2014-07-08 ENCOUNTER — Encounter: Payer: Self-pay | Admitting: Radiation Oncology

## 2014-07-08 DIAGNOSIS — C50211 Malignant neoplasm of upper-inner quadrant of right female breast: Secondary | ICD-10-CM

## 2014-07-08 DIAGNOSIS — Z5181 Encounter for therapeutic drug level monitoring: Secondary | ICD-10-CM

## 2014-07-08 DIAGNOSIS — Z9189 Other specified personal risk factors, not elsewhere classified: Secondary | ICD-10-CM

## 2014-07-08 DIAGNOSIS — C50919 Malignant neoplasm of unspecified site of unspecified female breast: Secondary | ICD-10-CM | POA: Insufficient documentation

## 2014-07-08 DIAGNOSIS — Z79899 Other long term (current) drug therapy: Secondary | ICD-10-CM

## 2014-07-08 NOTE — Progress Notes (Signed)
  Echocardiogram 2D Echocardiogram has been performed.  Marilyn Dalton 07/08/2014, 11:46 AM

## 2014-07-09 ENCOUNTER — Other Ambulatory Visit: Payer: Self-pay | Admitting: Hematology and Oncology

## 2014-07-09 ENCOUNTER — Ambulatory Visit
Admission: RE | Admit: 2014-07-09 | Discharge: 2014-07-09 | Disposition: A | Discharge status: Home / Self Care | Payer: Medicare Other | Source: Ambulatory Visit | Attending: Radiation Oncology | Admitting: Radiation Oncology

## 2014-07-09 ENCOUNTER — Encounter: Payer: Self-pay | Admitting: Radiation Oncology

## 2014-07-09 ENCOUNTER — Other Ambulatory Visit: Payer: Self-pay

## 2014-07-09 VITALS — BP 153/92 | HR 85 | Temp 98.0°F | Resp 20 | Ht 65.0 in | Wt 110.9 lb

## 2014-07-09 DIAGNOSIS — C50211 Malignant neoplasm of upper-inner quadrant of right female breast: Secondary | ICD-10-CM

## 2014-07-09 DIAGNOSIS — C50911 Malignant neoplasm of unspecified site of right female breast: Secondary | ICD-10-CM | POA: Diagnosis present

## 2014-07-09 DIAGNOSIS — Z79899 Other long term (current) drug therapy: Secondary | ICD-10-CM | POA: Insufficient documentation

## 2014-07-09 HISTORY — DX: Malignant neoplasm of unspecified site of unspecified female breast: C50.919

## 2014-07-09 MED ORDER — PROCHLORPERAZINE MALEATE 10 MG PO TABS: 10.0000 mg | ORAL_TABLET | Freq: Four times a day (QID) | ORAL | Status: DC | PRN

## 2014-07-09 MED ORDER — DEXAMETHASONE 4 MG PO TABS
4.0000 mg | ORAL_TABLET | Freq: Two times a day (BID) | ORAL | Status: DC
Start: 2014-07-09 — End: 2014-07-11

## 2014-07-09 MED ORDER — LIDOCAINE-PRILOCAINE 2.5-2.5 % EX CREA: TOPICAL_CREAM | CUTANEOUS | Status: DC

## 2014-07-09 MED ORDER — ONDANSETRON HCL 8 MG PO TABS
8.0000 mg | ORAL_TABLET | Freq: Two times a day (BID) | ORAL | Status: DC
Start: 2014-07-09 — End: 2014-07-11

## 2014-07-09 MED ORDER — LORAZEPAM 0.5 MG PO TABS: 0.5000 mg | ORAL_TABLET | Freq: Every day | ORAL | Status: DC

## 2014-07-09 NOTE — Patient Instructions (Addendum)
YOUR PROCEDURE IS SCHEDULED ON :  07/11/14  REPORT TO [facility] MAIN ENTRANCE FOLLOW SIGNS TO SHORT STAY CENTER AT :  7:30 AM  CALL THIS NUMBER IF YOU HAVE PROBLEMS THE MORNING OF SURGERY [phone]  REMEMBER:  DO NOT EAT FOOD OR DRINK LIQUIDS AFTER MIDNIGHT  TAKE THESE MEDICINES THE MORNING OF SURGERY:  NONE  STOP ASPIRIN / IBUPROFEN / ALEVE / VITAMINS / HERBAL MEDS __5__ DAYS BEFORE SURGERY  YOU MAY NOT HAVE ANY METAL ON YOUR BODY INCLUDING HAIR PINS AND PIERCING'S. DO NOT WEAR JEWELRY, MAKEUP, LOTIONS, POWDERS OR PERFUMES. DO NOT WEAR NAIL POLISH. DO NOT SHAVE 48 HRS PRIOR TO SURGERY. MEN MAY SHAVE FACE AND NECK.  DO NOT [facility]. [facility] IS NOT RESPONSIBLE FOR VALUABLES.  CONTACTS, DENTURES OR PARTIALS MAY NOT BE WORN TO SURGERY. LEAVE SUITCASE IN CAR. CAN BE BROUGHT TO ROOM AFTER SURGERY.  PATIENTS DISCHARGED THE DAY OF SURGERY WILL NOT BE ALLOWED TO DRIVE HOME.  PLEASE READ OVER THE FOLLOWING INSTRUCTION SHEETS _________________________________________________________________________________                                            [facility] - PREPARING FOR SURGERY  Before surgery, you can play an important role.  Because skin is not sterile, your skin needs to be as free of germs as possible.  You can reduce the number of germs on your skin by washing with CHG (chlorahexidine gluconate) soap before surgery.  CHG is an antiseptic cleaner which kills germs and bonds with the skin to continue killing germs even after washing. Please DO NOT use if you have an allergy to CHG or antibacterial soaps.  If your skin becomes reddened/irritated stop using the CHG and inform your nurse when you arrive at Short Stay. Do not shave (including legs and underarms) for at least 48 hours prior to the first CHG shower.  You may shave your face. Please follow these instructions carefully:   1.  Shower with CHG Soap the night before surgery and  the  morning of Surgery.   2.  If you choose to wash your hair, wash your hair first as usual with your  normal  Shampoo.   3.  After you shampoo, rinse your hair and body thoroughly to remove the  shampoo.                                         4.  Use CHG as you would any other liquid soap.  You can apply chg directly  to the skin and wash . Gently wash with scrungie or clean wascloth    5.  Apply the CHG Soap to your body ONLY FROM THE NECK DOWN.   Do not use on open                           Wound or open sores. Avoid contact with eyes, ears mouth and genitals (private parts).                        Genitals (private parts) with your normal soap.  6.  Wash thoroughly, paying special attention to the area where your surgery  will be performed.   7.  Thoroughly rinse your body with warm water from the neck down.   8.  DO NOT shower/wash with your normal soap after using and rinsing off  the CHG Soap .                9.  Pat yourself dry with a clean towel.             10.  Wear clean night clothes to bed after shower             11.  Place clean sheets on your bed the night of your first shower and do not  sleep with pets.  Day of Surgery : Do not apply any lotions/deodorants the morning of surgery.  Please wear clean clothes to the hospital/surgery center.  FAILURE TO FOLLOW THESE INSTRUCTIONS MAY RESULT IN THE CANCELLATION OF YOUR SURGERY    PATIENT SIGNATURE_________________________________  ______________________________________________________________________

## 2014-07-09 NOTE — Progress Notes (Signed)
Home meds and standing labs released.  Ativan called in to [facility].  pof sent to schedule lab/chemo on 5/23 and lab/nadir OV 5/31.  Inbasket sent to Vernetta Honey and Audie Clear about scheduling.

## 2014-07-09 NOTE — Progress Notes (Signed)
Radiation Oncology         [phone] ________________________________  Name: Marilyn Dalton MRN: [id number]  Date: 07/09/2014  DOB: [birth year]  KA:JGOTLXB,WIOMB A, MD  [name], MD     REFERRING PHYSICIAN: [name], MD   DIAGNOSIS: Newly diagnosed breast cancer   HISTORY OF PRESENT ILLNESS:Marilyn Dalton is a 74 y.o. female who is seen for an initial consultation visit.Patient was sunbathing and felt a lump in the right breast for the last 2 months. A few weeks later she went to see her primary care doctor who referred her for a breast center. She underwent a mammogram that revealed a mass in the right breast and ultrasound. 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 Ki-67 was 20%. She underwent a breast MRI that revealed a 2.4 cm irregular mass in the right breast. She was referred to [place] for consideration for neoadjuvant chemotherapy. She was presented at the multidisciplinary tumor board.   PREVIOUS RADIATION THERAPY: No   PAST MEDICAL HISTORY:  has a past medical history of Arthritis; Breast cancer (06/20/14); and Allergy.     PAST SURGICAL HISTORY: Past Surgical History  Procedure Laterality Date  . Colon surgery  10/13/08    cecum polyp=adenomatous ,no high grade dysplasia or invasive malignancy  . Breast biopsy Left 10/6/210    no malignancy,extensive stromal fibrosis  . Breast biopsy Right 06/20/14    invasive ductal ca,dcis      FAMILY HISTORY: family history is not on file.   SOCIAL HISTORY:  reports that she has been smoking.  She does not have any smokeless tobacco history on file. She reports that she drinks alcohol.   ALLERGIES: Review of patient's allergies indicates no known allergies.   MEDICATIONS:  Current Outpatient Prescriptions  Medication Sig Dispense Refill  . azithromycin (ZITHROMAX) 250 MG tablet Take 250 mg by mouth daily.   0  . azithromycin  (ZITHROMAX) 500 MG tablet Take 1 tablet (500 mg total) by mouth daily. 5 tablet 0  . fluticasone (FLONASE) 50 MCG/ACT nasal spray   0  . glycerin, Pediatric, 1.2 G SUPP Place 1 suppository rectally daily as needed for moderate constipation.    [name] HYDROcodone-acetaminophen (HYCET) 7.5-325 mg/15 ml solution Take 5 mLs by mouth every 6 (six) hours as needed (or cough). (Patient not taking: Reported on 07/09/2014) 240 mL 0  . naproxen sodium (ANAPROX) 220 MG tablet Take 220 mg by mouth as needed.    . predniSONE (DELTASONE) 10 MG tablet 6-5-4-3-2-1 po pc for breathing (Patient not taking: Reported on 07/09/2014) 21 tablet 0   No current facility-administered medications for this encounter.     REVIEW OF SYSTEMS:  A 15 point review of systems is documented in the electronic medical record. This was obtained by the nursing staff. However, I reviewed this with the patient to discuss relevant findings and make appropriate changes.  Pertinent items are noted in HPI.    PHYSICAL EXAM:  vitals were not taken for this visit.  ECOG = 0   0 - Asymptomatic (Fully active, able to carry on all predisease activities without restriction)  1 - Symptomatic but completely ambulatory (Restricted in physically strenuous activity but ambulatory and able to carry out work of a light or sedentary nature. For example, light housework, office work)  2 - Symptomatic, <50% in bed during the day (Ambulatory and capable of all self care but unable to carry  out any work activities. Up and about more than 50% of waking hours)  3 - Symptomatic, >50% in bed, but not bedbound (Capable of only limited self-care, confined to bed or chair 50% or more of waking hours)  4 - Bedbound (Completely disabled. Cannot carry on any self-care. Totally confined to bed or chair)  5 - Death   Eustace Pen MM, Creech RH, Tormey DC, et al. [phone]). "Toxicity and response criteria of the [facility]". [place] Oncol. 5 (6):  649-55  General: Well-developed, in no acute distress HEENT: Normocephalic, atraumatic; oral cavity clear Neck: Supple without any lymphadenopathy Cardiovascular: Regular rate and rhythm Respiratory: Clear to auscultation bilaterally GI: Soft, nontender, normal bowel sounds Extremities: No edema present Neuro: No focal deficits     LABORATORY DATA:  Lab Results  Component Value Date   WBC 7.0 06/20/2013   HGB 15.9 06/20/2013   HCT 48.0* 06/20/2013   MCV 102.6* 06/20/2013   No results found for: NA, K, CL, CO2 No results found for: ALT, AST, GGT, ALKPHOS, BILITOT    RADIOGRAPHY: Mr Breast Bilateral W Wo Contrast  06/30/2014   CLINICAL DATA:  75 year old female with newly diagnosed right breast invasive ductal carcinoma and DCIS.  LABS:  BUN and creatinine were obtained on site at [facility] at  [address]  Results:  BUN 20.0 mg/dL,  Creatinine 0.7 mg/dL.  EXAM: BILATERAL BREAST MRI WITH AND WITHOUT CONTRAST  TECHNIQUE: Multiplanar, multisequence MR images of both breasts were obtained prior to and following the intravenous administration of 9 ml of MultiHance.  THREE-DIMENSIONAL MR IMAGE RENDERING ON INDEPENDENT WORKSTATION:  Three-dimensional MR images were rendered by post-processing of the original MR data on an independent workstation. The three-dimensional MR images were interpreted, and findings are reported in the following complete MRI report for this study. Three dimensional images were evaluated at the independent DynaCad workstation  COMPARISON:  Recent mammograms and ultrasounds.  FINDINGS: Mild motion artifact slightly decreases sensitivity.  Breast composition: d. Extreme fibroglandular tissue.  Background parenchymal enhancement: Mild  Right breast: A 2.4 x 2.2 x 2.2 cm (AP x transverse x CC) irregular mass within the posterior outer/upper-outer right breast contains biopsy clip artifact and exhibits rapid washout kinetics, compatible with biopsy-proven  malignancy. This mass abuts the pectoralis muscle.  No other suspicious areas of enhancement within the right breast identified.  Left breast: No mass or abnormal enhancement.  Lymph nodes: No abnormal appearing lymph nodes.  Ancillary findings:  None.  IMPRESSION: 2.4 x 2.2 x 2.2 cm biopsy-proven malignancy within the posterior outer/upper-outer right breast and abuts the pectoralis muscle. No evidence of multifocal/ multicentric/contralateral malignancy or abnormal lymph nodes.  RECOMMENDATION: Treatment plan  BI-RADS CATEGORY  6: Known biopsy-proven malignancy.   Electronically Signed   By: Margarette Canada M.D.   On: 06/30/2014 11:58   Mm Digital Diagnostic Unilat R  06/20/2014   CLINICAL DATA:  Post ultrasound-guided core needle biopsy clip mammograms.  EXAM: DIAGNOSTIC RIGHT MAMMOGRAM POST ULTRASOUND BIOPSY  COMPARISON:  Previous exam(s).  FINDINGS: Mammographic images were obtained following ultrasound guided biopsy of area of irregular hypo echogenicity corresponding to a palpable mass in the lateral right breast. Coil shaped biopsy clip lies in the expected location of the palpable masslike area.  IMPRESSION: Well-positioned coil shaped biopsy clip in the lateral right breast.  Final Assessment: Post Procedure Mammograms for Marker Placement   Electronically Signed   By: Lajean Manes M.D.   On: 06/20/2014 13:55  US Breast Ltd Uni Right Inc Axilla  06/12/2014   CLINICAL DATA:  Patient with a palpable abnormality in the right breast lateral to the right nipple. She is not sure whether this is a new palpable abnormality.  EXAM: DIGITAL DIAGNOSTIC BILATERAL MAMMOGRAM WITH 3D TOMOSYNTHESIS WITH CAD  ULTRASOUND RIGHT BREAST  COMPARISON:  None.  ACR Breast Density Category d: The breast tissue is extremely dense, which lowers the sensitivity of mammography.  FINDINGS: There are no discrete masses, areas of architectural distortion, areas of significant asymmetry or suspicious calcifications. No mammographic  change.  Mammographic images were processed with CAD.  On physical exam, there is a firm mobile nodule lateral to the right nipple. Both breasts show relatively nodular fibroglandular tissue throughout the upper outer and lateral aspects. The firm air in the right appears to be distinct.  Targeted ultrasound is performed, showing heterogeneous hypoechoic shadowing tissue throughout the upper outer quadrant right breast, without a discrete mass. This heterogeneous hypoechoic shadowing tissue is seen corresponding to the firm palpable abnormality lateral to the right breast at 9 o'clock, 1-2 cm from the nipple.  IMPRESSION: Suspicious abnormality in the right breast. Although it is not evident mammographically and is not seen as a discrete abnormality on sonography, there is heterogeneous, hypoechoic shadowing tissue in this location on ultrasound. The sound guided biopsy is recommended.  RECOMMENDATION: Ultrasound-guided core needle biopsy of the palpable abnormality and corresponding hypoechoic, shadowing area on ultrasound.  I have discussed the findings and recommendations with the patient. Results were also provided in writing at the conclusion of the visit. If applicable, a reminder letter will be sent to the patient regarding the next appointment.  BI-RADS CATEGORY  4: Suspicious.   Electronically Signed   By: Lajean Manes M.D.   On: 06/12/2014 14:55   Mm Diag Breast Tomo Bilateral  06/12/2014   CLINICAL DATA:  Patient with a palpable abnormality in the right breast lateral to the right nipple. She is not sure whether this is a new palpable abnormality.  EXAM: DIGITAL DIAGNOSTIC BILATERAL MAMMOGRAM WITH 3D TOMOSYNTHESIS WITH CAD  ULTRASOUND RIGHT BREAST  COMPARISON:  None.  ACR Breast Density Category d: The breast tissue is extremely dense, which lowers the sensitivity of mammography.  FINDINGS: There are no discrete masses, areas of architectural distortion, areas of significant asymmetry or suspicious  calcifications. No mammographic change.  Mammographic images were processed with CAD.  On physical exam, there is a firm mobile nodule lateral to the right nipple. Both breasts show relatively nodular fibroglandular tissue throughout the upper outer and lateral aspects. The firm air in the right appears to be distinct.  Targeted ultrasound is performed, showing heterogeneous hypoechoic shadowing tissue throughout the upper outer quadrant right breast, without a discrete mass. This heterogeneous hypoechoic shadowing tissue is seen corresponding to the firm palpable abnormality lateral to the right breast at 9 o'clock, 1-2 cm from the nipple.  IMPRESSION: Suspicious abnormality in the right breast. Although it is not evident mammographically and is not seen as a discrete abnormality on sonography, there is heterogeneous, hypoechoic shadowing tissue in this location on ultrasound. The sound guided biopsy is recommended.  RECOMMENDATION: Ultrasound-guided core needle biopsy of the palpable abnormality and corresponding hypoechoic, shadowing area on ultrasound.  I have discussed the findings and recommendations with the patient. Results were also provided in writing at the conclusion of the visit. If applicable, a reminder letter will be sent to the patient regarding the next appointment.  BI-RADS CATEGORY  4: Suspicious.   Electronically Signed   By: Lajean Manes M.D.   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 This was found to be concordant by Dr. Everlean Alstrom. The patient reported tenderness at the biopsy site. Post biopsy instructions were reviewed and questions were answered. The patient was encouraged to call [facility] with any additional questions and or concerns. Surgical consultation was made  with Dr. Adonis Housekeeper of [facility] Surgery for Jun 27, 2014.  Pathology results reported by Terie Purser RN on Jun 24, 2014.   Electronically Signed   By: Lajean Manes M.D.   On: 06/24/2014 17:05   06/25/2014   CLINICAL DATA:  Patient presents for ultrasound-guided core needle biopsy of a masslike area lateral to the right nipple, showing a more generalized area of hypo echogenicity and shadowing on ultrasound no discrete imaging mass.  EXAM: ULTRASOUND GUIDED RIGHT BREAST CORE NEEDLE BIOPSY  COMPARISON:  Previous exam(s).  PROCEDURE: I met with the patient and we discussed the procedure of ultrasound-guided biopsy, including benefits and alternatives. We discussed the high likelihood of a successful procedure. We discussed the risks of the procedure including infection, bleeding, tissue injury, clip migration, and inadequate sampling. Informed written consent was given. The usual time-out protocol was performed immediately prior to the procedure.  Using sterile technique and 2% Lidocaine as local anesthetic, under direct ultrasound visualization, a 12 gauge vacuum-assisted device was used to perform biopsy of the palpable mass like area an area of irregular hypo echogenicityusing an inferior, lateral approach. At the conclusion of the procedure, a coil shaped tissue marker clip was deployed into the biopsy cavity. Follow-up 2-view mammogram was performed and dictated separately.  IMPRESSION: Ultrasound-guided biopsy of a palpable masslike area in the right breast. No apparent complications.  Electronically Signed: By: Lajean Manes M.D. On: 06/20/2014 13:36       IMPRESSION: Invasive ductal cancer of the right breast, tumor about an inch located in the right breast, stage two.   PLAN: Chemotherapy treatment before lumpectomy of the right breast. Anticipate four week radiation treatment. Hormonal treatment to follow. Look forward to seeing patient after surgery to coordinate radiation.   I spent 60  minutes face to face with the patient and more than 50% of that time was spent in counseling and/or coordination of care.   This document serves as a record of services personally performed by Kyung Rudd, MD. It was created on his behalf by Pearlie Oyster, a trained medical scribe. The creation of this record is based on the scribe's personal observations and the provider's statements to them. This document has been checked and approved by the attending provider.  ________________________________   Jodelle Gross, MD, PhD   **Disclaimer: This note was dictated with voice recognition software. Similar sounding words can inadvertently be transcribed and this note may contain transcription errors which may not have been corrected upon publication of note.**

## 2014-07-10 ENCOUNTER — Encounter (HOSPITAL_COMMUNITY): Payer: Self-pay

## 2014-07-10 ENCOUNTER — Other Ambulatory Visit: Payer: Self-pay | Admitting: *Deleted

## 2014-07-10 ENCOUNTER — Encounter (HOSPITAL_COMMUNITY)
Admission: RE | Admit: 2014-07-10 | Discharge: 2014-07-10 | Disposition: A | Discharge status: Home / Self Care | Payer: Medicare Other | Source: Ambulatory Visit | Attending: General Surgery | Admitting: General Surgery

## 2014-07-10 ENCOUNTER — Telehealth: Payer: Self-pay | Admitting: *Deleted

## 2014-07-10 ENCOUNTER — Inpatient Hospital Stay (HOSPITAL_COMMUNITY): Admission: RE | Admit: 2014-07-10 | Payer: Medicare Other | Source: Ambulatory Visit

## 2014-07-10 DIAGNOSIS — C50911 Malignant neoplasm of unspecified site of right female breast: Secondary | ICD-10-CM | POA: Diagnosis present

## 2014-07-10 DIAGNOSIS — C50411 Malignant neoplasm of upper-outer quadrant of right female breast: Secondary | ICD-10-CM | POA: Diagnosis not present

## 2014-07-10 DIAGNOSIS — Z7952 Long term (current) use of systemic steroids: Secondary | ICD-10-CM | POA: Diagnosis not present

## 2014-07-10 DIAGNOSIS — Z792 Long term (current) use of antibiotics: Secondary | ICD-10-CM | POA: Diagnosis not present

## 2014-07-10 DIAGNOSIS — F17200 Nicotine dependence, unspecified, uncomplicated: Secondary | ICD-10-CM | POA: Diagnosis not present

## 2014-07-10 DIAGNOSIS — Z170 Estrogen receptor positive status [ER+]: Secondary | ICD-10-CM | POA: Diagnosis not present

## 2014-07-10 DIAGNOSIS — K219 Gastro-esophageal reflux disease without esophagitis: Secondary | ICD-10-CM | POA: Diagnosis not present

## 2014-07-10 DIAGNOSIS — M1990 Unspecified osteoarthritis, unspecified site: Secondary | ICD-10-CM | POA: Diagnosis not present

## 2014-07-10 DIAGNOSIS — Z79891 Long term (current) use of opiate analgesic: Secondary | ICD-10-CM | POA: Diagnosis not present

## 2014-07-10 HISTORY — DX: Diverticulitis of intestine, part unspecified, without perforation or abscess without bleeding: K57.92

## 2014-07-10 HISTORY — DX: Gastro-esophageal reflux disease without esophagitis: K21.9

## 2014-07-10 LAB — CBC
HEMATOCRIT: 44.3 % (ref 36.0–46.0)
Hemoglobin: 14.4 g/dL (ref 12.0–15.0)
MCH: 31.5 pg (ref 26.0–34.0)
MCHC: 32.5 g/dL (ref 30.0–36.0)
MCV: 96.9 fL (ref 78.0–100.0)
Platelets: 357 10*3/uL (ref 150–400)
RBC: 4.57 MIL/uL (ref 3.87–5.11)
RDW: 14.8 % (ref 11.5–15.5)
WBC: 9.8 10*3/uL (ref 4.0–10.5)

## 2014-07-10 LAB — DIFFERENTIAL
BASOS PCT: 1 % (ref 0–1)
Basophils Absolute: 0.1 10*3/uL (ref 0.0–0.1)
EOS ABS: 0.1 10*3/uL (ref 0.0–0.7)
Eosinophils Relative: 1 % (ref 0–5)
LYMPHS ABS: 1.8 10*3/uL (ref 0.7–4.0)
Lymphocytes Relative: 18 % (ref 12–46)
Monocytes Absolute: 0.7 10*3/uL (ref 0.1–1.0)
Monocytes Relative: 7 % (ref 3–12)
NEUTROS ABS: 7.2 10*3/uL (ref 1.7–7.7)
NEUTROS PCT: 73 % (ref 43–77)

## 2014-07-10 LAB — COMPREHENSIVE METABOLIC PANEL
ALT: 19 U/L (ref 14–54)
ANION GAP: 8 (ref 5–15)
AST: 25 U/L (ref 15–41)
Albumin: 4.1 g/dL (ref 3.5–5.0)
Alkaline Phosphatase: 70 U/L (ref 38–126)
BILIRUBIN TOTAL: 0.5 mg/dL (ref 0.3–1.2)
BUN: 23 mg/dL — AB (ref 6–20)
CALCIUM: 10.1 mg/dL (ref 8.9–10.3)
CO2: 27 mmol/L (ref 22–32)
CREATININE: 0.62 mg/dL (ref 0.44–1.00)
Chloride: 104 mmol/L (ref 101–111)
GFR calc Af Amer: >60 mL/min (ref >60)
GFR calc non Af Amer: >60 mL/min (ref >60)
Glucose, Bld: 103 mg/dL — ABNORMAL HIGH (ref 65–99)
Potassium: 4.8 mmol/L (ref 3.5–5.1)
Sodium: 139 mmol/L (ref 135–145)
TOTAL PROTEIN: 7.7 g/dL (ref 6.5–8.1)

## 2014-07-10 NOTE — H&P (Signed)
History of Present Illness Marilyn Kitchen T. Loriel Diehl MD; 06/27/2014 3:39 PM) Patient words: breast cancer.  The patient is a 74 year old female who presents with breast cancer. Sheis a 74 year old menopausal female referred by Dr. Lajean Manes for evaluation of recently diagnosed carcinoma of the right breast. About 2 months ago she noticed a lump in the upper outer right breast. She indicates about an inch in size. She subsequently saw her family physician and was referred to the breast center for further evaluation. Subsequent imaging included diagnostic mamogram showing very dense breast tissue but no discrete mass and ultrasound showing an ill-defined area of hypoechoic and heterogeneous tissue in the upper outer right breast which appeared abnormal but could not be very well characterized particularly in terms of size. An ultrasound guided breast biopsy was performed on June 20, 2014 with pathology revealing in situ and invasive ductal carcinoma of the breast. She is seen now in the office for initial treatment planning. She has experienced a lump in the right breast as noted but no nipple discharge or skin changes or nipple inversion. She does have a personal history of a benign lump in the left breast Findings at that time were the following:   Tumor size: Indeterminant but potentially extensive throughout the upper outer quadrant of the right breast Tumor grade: 2 Estrogen Receptor: Positive Progesterone Receptor: Positive Her-2 neu: Positive Lymph node status: Negative    Other Problems Elbert Ewings, CMA; 06/27/2014 2:28 PM) Arthritis Breast Cancer Gastroesophageal Reflux Disease  Past Surgical History Elbert Ewings, CMA; 06/27/2014 2:28 PM) Appendectomy Breast Biopsy Bilateral. Colon Polyp Removal - Colonoscopy Hysterectomy (not due to cancer) - Partial Knee Surgery Left. Tonsillectomy  Diagnostic Studies History Elbert Ewings, [place]; 06/27/2014 2:28 PM) Colonoscopy 1-5  years ago Mammogram within last year  Allergies Elbert Ewings, CMA; 06/27/2014 2:28 PM) No Known Drug Allergies05/07/2014  Medication History Elbert Ewings, CMA; 06/27/2014 2:29 PM) Zithromax (500MG Tablet, Oral) Active. Hydrocodone-Acetaminophen (7.5-750MG Tablet, Oral) Active. PrednisoLONE Sodium Phosphate (10MG Tablet Disperse, Oral) Active. Medications Reconciled  Social History Elbert Ewings, [place]; 06/27/2014 2:28 PM) Alcohol use Moderate alcohol use. Caffeine use Coffee. No drug use Tobacco use Current every day smoker.  Family History Elbert Ewings, [place]; 06/27/2014 2:28 PM) Alcohol Abuse Daughter. Arthritis Daughter. Prostate Cancer Family Members In General.  Pregnancy / Birth History Elbert Ewings, [place]; 06/27/2014 2:28 PM) Age at menarche 1 years. Age of menopause 96-50 Contraceptive History Intrauterine device, Oral contraceptives. Gravida 4 Maternal age 53-20 Para 4  Review of Systems Elbert Ewings CMA; 06/27/2014 2:28 PM) General Not Present- Appetite Loss, Chills, Fatigue, Fever, Night Sweats, Weight Gain and Weight Loss. Skin Not Present- Change in Wart/Mole, Dryness, Hives, Jaundice, New Lesions, Non-Healing Wounds, Rash and Ulcer. HEENT Present- Seasonal Allergies and Wears glasses/contact lenses. Not Present- Earache, Hearing Loss, Hoarseness, Nose Bleed, Oral Ulcers, Ringing in the Ears, Sinus Pain, Sore Throat, Visual Disturbances and Yellow Eyes. Respiratory Not Present- Bloody sputum, Chronic Cough, Difficulty Breathing, Snoring and Wheezing. Breast Present- Breast Mass. Not Present- Breast Pain, Nipple Discharge and Skin Changes. Cardiovascular Not Present- Chest Pain, Difficulty Breathing Lying Down, Leg Cramps, Palpitations, Rapid Heart Rate, Shortness of Breath and Swelling of Extremities. Gastrointestinal Not Present- Abdominal Pain, Bloating, Bloody Stool, Change in Bowel Habits, Chronic diarrhea, Constipation, Difficulty Swallowing, Excessive gas,  Gets full quickly at meals, Hemorrhoids, Indigestion, Nausea, Rectal Pain and Vomiting. Female Genitourinary Not Present- Frequency, Nocturia, Painful Urination, Pelvic Pain and Urgency. Musculoskeletal Not Present- Back Pain, Joint Pain, Joint  Stiffness, Muscle Pain, Muscle Weakness and Swelling of Extremities. Neurological Not Present- Decreased Memory, Fainting, Headaches, Numbness, Seizures, Tingling, Tremor, Trouble walking and Weakness. Psychiatric Not Present- Anxiety, Bipolar, Change in Sleep Pattern, Depression, Fearful and Frequent crying. Endocrine Present- Hot flashes. Not Present- Cold Intolerance, Excessive Hunger, Hair Changes, Heat Intolerance and New Diabetes. Hematology Not Present- Easy Bruising, Excessive bleeding, Gland problems, HIV and Persistent Infections.   Vitals Elbert Ewings CMA; 06/27/2014 2:30 PM) 06/27/2014 2:29 PM Weight: 106 lb Height: 65in Body Surface Area: 1.48 m Body Mass Index: 17.64 kg/m Temp.: 98.45F(Oral)  Pulse: 96 (Regular)  Resp.: 17 (Unlabored)  BP: 126/68 (Sitting, Left Arm, Standard)    Physical Exam Marilyn Kitchen T. Winfrey Chillemi MD; 06/27/2014 3:40 PM) The physical exam findings are as follows: Note:General: Alert, very thin Caucasian female, in no distress Skin: Warm and dry without rash or infection. HEENT: No palpable masses or thyromegaly. Sclera nonicteric. Pupils equal round and reactive. Oropharynx clear. Lymph nodes: No cervical, supraclavicular, or inguinal nodes palpable. Breasts: Small breasts bilaterally. In the upper outer quadrant of the right breast is a somewhat diffuse area of swelling and thickening with indistinct margins and not firm. In the upper outer left breast or some slightly irregular typical fibrocystic changes. No skin changes or nipple inversion Lungs: Breath sounds clear and equal. No wheezing or increased work of breathing. Cardiovascular: Regular rate and rhythm without murmer. No JVD or edema. Peripheral  pulses intact. No carotid bruits. Abdomen: Nondistended. Soft and nontender. No masses palpable. No organomegaly. No palpable hernias. Extremities: No edema or joint swelling or deformity. No chronic venous stasis changes. Neurologic: Alert and fully oriented. Gait normal. No focal weakness. Psychiatric: Normal mood and affect. Thought content appropriate with normal judgement and insight    Assessment & Plan Marilyn Kitchen T. Korah Hufstedler MD; 06/27/2014 3:42 PM) MALIGNANT NEOPLASM OF UPPER-OUTER QUADRANT OF RIGHT FEMALE BREAST (174.4  C50.411) Impression: 75 year old female with a new diagnosis of cancer of the right breast, upper outer quadrant. Clinical stage 1A, ER positive, PR positive, HER-2 positive. I discussed with the patient and family members present today initial surgical treatment options. We discussed options of breast conservation with lumpectomy or total mastectomy and sentinal lymph node biopsy/dissection. Options for reconstruction were discussed. The size of her tumor is very indistinct on imaging and actually on physical exam as well. I think the MRI is going to be critical in decision making. I did tell the patient that I think based on what I am feeling in the appearance of the ultrasound and the size of her breasts that she may require total mastectomy. We discussed potential nipple sparing mastectomy and she is definitely interested in immediate reconstruction. We discussed that she will need a Port-A-Cath for Herceptin therapy. As soon as I'm able to review her MRI I will get back to her regarding further surgical decision making which we may be able to accomplish by phone or with another office visit depending on her preference. Current Plans  Instructed to make follow-up appointment for office visit following completion of diagnostic tests

## 2014-07-10 NOTE — Telephone Encounter (Signed)
Per staff message and POF I have scheduled appts. Advised scheduler of appts. JMW  

## 2014-07-11 ENCOUNTER — Ambulatory Visit (HOSPITAL_COMMUNITY): Payer: Medicare Other | Admitting: Certified Registered"

## 2014-07-11 ENCOUNTER — Encounter: Payer: Self-pay | Admitting: Hematology and Oncology

## 2014-07-11 ENCOUNTER — Encounter (HOSPITAL_COMMUNITY): Payer: Self-pay | Admitting: *Deleted

## 2014-07-11 ENCOUNTER — Ambulatory Visit (HOSPITAL_COMMUNITY): Payer: Medicare Other

## 2014-07-11 ENCOUNTER — Encounter (HOSPITAL_COMMUNITY)
Admission: RE | Disposition: A | Discharge status: Home / Self Care | Payer: Self-pay | Source: Ambulatory Visit | Attending: General Surgery

## 2014-07-11 ENCOUNTER — Telehealth: Payer: Self-pay | Admitting: Hematology and Oncology

## 2014-07-11 ENCOUNTER — Telehealth: Payer: Self-pay | Admitting: *Deleted

## 2014-07-11 ENCOUNTER — Other Ambulatory Visit: Payer: Self-pay

## 2014-07-11 ENCOUNTER — Ambulatory Visit (HOSPITAL_COMMUNITY)
Admission: RE | Admit: 2014-07-11 | Discharge: 2014-07-11 | Disposition: A | Discharge status: Home / Self Care | Payer: Medicare Other | Source: Ambulatory Visit | Attending: General Surgery | Admitting: General Surgery

## 2014-07-11 ENCOUNTER — Other Ambulatory Visit: Payer: Medicare Other

## 2014-07-11 DIAGNOSIS — K219 Gastro-esophageal reflux disease without esophagitis: Secondary | ICD-10-CM | POA: Diagnosis not present

## 2014-07-11 DIAGNOSIS — C50211 Malignant neoplasm of upper-inner quadrant of right female breast: Secondary | ICD-10-CM

## 2014-07-11 DIAGNOSIS — Z170 Estrogen receptor positive status [ER+]: Secondary | ICD-10-CM | POA: Insufficient documentation

## 2014-07-11 DIAGNOSIS — C50411 Malignant neoplasm of upper-outer quadrant of right female breast: Secondary | ICD-10-CM | POA: Diagnosis not present

## 2014-07-11 DIAGNOSIS — F17200 Nicotine dependence, unspecified, uncomplicated: Secondary | ICD-10-CM | POA: Diagnosis not present

## 2014-07-11 DIAGNOSIS — Z792 Long term (current) use of antibiotics: Secondary | ICD-10-CM | POA: Insufficient documentation

## 2014-07-11 DIAGNOSIS — C50911 Malignant neoplasm of unspecified site of right female breast: Secondary | ICD-10-CM

## 2014-07-11 DIAGNOSIS — Z7952 Long term (current) use of systemic steroids: Secondary | ICD-10-CM | POA: Insufficient documentation

## 2014-07-11 DIAGNOSIS — Z79891 Long term (current) use of opiate analgesic: Secondary | ICD-10-CM | POA: Insufficient documentation

## 2014-07-11 DIAGNOSIS — M1990 Unspecified osteoarthritis, unspecified site: Secondary | ICD-10-CM | POA: Diagnosis not present

## 2014-07-11 DIAGNOSIS — Z95828 Presence of other vascular implants and grafts: Secondary | ICD-10-CM

## 2014-07-11 HISTORY — PX: PORTACATH PLACEMENT: SHX2246

## 2014-07-11 SURGERY — INSERTION, TUNNELED CENTRAL VENOUS DEVICE, WITH PORT
Anesthesia: Monitor Anesthesia Care | Site: Chest | Laterality: Left

## 2014-07-11 MED ORDER — HEPARIN SOD (PORK) LOCK FLUSH 100 UNIT/ML IV SOLN
INTRAVENOUS | Status: DC | PRN
  Administered 2014-07-11: 500 [IU]

## 2014-07-11 MED ORDER — ONDANSETRON HCL 4 MG/2ML IJ SOLN
INTRAMUSCULAR | Status: DC | PRN
  Administered 2014-07-11: 4 mg via INTRAVENOUS

## 2014-07-11 MED ORDER — HYDROCODONE-ACETAMINOPHEN 5-325 MG PO TABS: 1.0000 | ORAL_TABLET | ORAL | Status: DC | PRN

## 2014-07-11 MED ORDER — FENTANYL CITRATE (PF) 100 MCG/2ML IJ SOLN
INTRAMUSCULAR | Status: AC
  Filled 2014-07-11: qty 2

## 2014-07-11 MED ORDER — DEXAMETHASONE SODIUM PHOSPHATE 10 MG/ML IJ SOLN
INTRAMUSCULAR | Status: DC | PRN
  Administered 2014-07-11: 10 mg via INTRAVENOUS

## 2014-07-11 MED ORDER — PROPOFOL 10 MG/ML IV BOLUS
INTRAVENOUS | Status: DC | PRN
  Administered 2014-07-11: 20 mg via INTRAVENOUS
  Administered 2014-07-11: 50 mg via INTRAVENOUS

## 2014-07-11 MED ORDER — DEXAMETHASONE 4 MG PO TABS: 4.0000 mg | ORAL_TABLET | Freq: Two times a day (BID) | ORAL | Status: DC

## 2014-07-11 MED ORDER — ONDANSETRON HCL 4 MG/2ML IJ SOLN
INTRAMUSCULAR | Status: AC
  Filled 2014-07-11: qty 2

## 2014-07-11 MED ORDER — BUPIVACAINE-EPINEPHRINE (PF) 0.5% -1:200000 IJ SOLN
INTRAMUSCULAR | Status: AC
  Filled 2014-07-11: qty 30

## 2014-07-11 MED ORDER — LIDOCAINE HCL 0.5 % IJ SOLN
INTRAMUSCULAR | Status: DC | PRN
  Administered 2014-07-11: 5 mL

## 2014-07-11 MED ORDER — PROPOFOL 10 MG/ML IV BOLUS
INTRAVENOUS | Status: AC
  Filled 2014-07-11: qty 20

## 2014-07-11 MED ORDER — PROMETHAZINE HCL 25 MG/ML IJ SOLN: 6.2500 mg | INTRAMUSCULAR | Status: DC | PRN

## 2014-07-11 MED ORDER — BUPIVACAINE-EPINEPHRINE (PF) 0.25% -1:200000 IJ SOLN
INTRAMUSCULAR | Status: AC
  Filled 2014-07-11: qty 30

## 2014-07-11 MED ORDER — HYDROCODONE-ACETAMINOPHEN 5-325 MG PO TABS: 1.0000 | ORAL_TABLET | Freq: Once | ORAL | Status: DC

## 2014-07-11 MED ORDER — SODIUM BICARBONATE 4 % IV SOLN
INTRAVENOUS | Status: DC | PRN
  Administered 2014-07-11: 1 mL

## 2014-07-11 MED ORDER — HYDROMORPHONE HCL 1 MG/ML IJ SOLN: 0.2500 mg | INTRAMUSCULAR | Status: DC | PRN

## 2014-07-11 MED ORDER — MIDAZOLAM HCL 5 MG/5ML IJ SOLN
INTRAMUSCULAR | Status: DC | PRN
  Administered 2014-07-11: 2 mg via INTRAVENOUS

## 2014-07-11 MED ORDER — SODIUM BICARBONATE 4 % IV SOLN
INTRAVENOUS | Status: AC
  Filled 2014-07-11: qty 5

## 2014-07-11 MED ORDER — PROPOFOL INFUSION 10 MG/ML OPTIME
INTRAVENOUS | Status: DC | PRN
  Administered 2014-07-11: 100 ug/kg/min via INTRAVENOUS

## 2014-07-11 MED ORDER — MIDAZOLAM HCL 2 MG/2ML IJ SOLN
INTRAMUSCULAR | Status: AC
  Filled 2014-07-11: qty 2

## 2014-07-11 MED ORDER — LACTATED RINGERS IV SOLN
INTRAVENOUS | Status: DC
  Administered 2014-07-11: 1000 mL via INTRAVENOUS

## 2014-07-11 MED ORDER — LIDOCAINE HCL 0.5 % IJ SOLN
INTRAMUSCULAR | Status: AC
  Filled 2014-07-11: qty 1

## 2014-07-11 MED ORDER — EPHEDRINE SULFATE 50 MG/ML IJ SOLN
INTRAMUSCULAR | Status: DC | PRN
  Administered 2014-07-11 (×2): 5 mg via INTRAVENOUS

## 2014-07-11 MED ORDER — ONDANSETRON HCL 8 MG PO TABS: 8.0000 mg | ORAL_TABLET | Freq: Two times a day (BID) | ORAL | Status: DC

## 2014-07-11 MED ORDER — CEFAZOLIN SODIUM-DEXTROSE 2-3 GM-% IV SOLR
2.0000 g | INTRAVENOUS | Status: AC
  Administered 2014-07-11: 2 g via INTRAVENOUS

## 2014-07-11 MED ORDER — CEFAZOLIN SODIUM-DEXTROSE 2-3 GM-% IV SOLR
INTRAVENOUS | Status: AC
  Filled 2014-07-11: qty 50

## 2014-07-11 MED ORDER — DEXAMETHASONE SODIUM PHOSPHATE 10 MG/ML IJ SOLN
INTRAMUSCULAR | Status: AC
  Filled 2014-07-11: qty 1

## 2014-07-11 MED ORDER — HEPARIN SOD (PORK) LOCK FLUSH 100 UNIT/ML IV SOLN
INTRAVENOUS | Status: AC
  Filled 2014-07-11: qty 5

## 2014-07-11 MED ORDER — LIDOCAINE-PRILOCAINE 2.5-2.5 % EX CREA: TOPICAL_CREAM | CUTANEOUS | Status: DC

## 2014-07-11 MED ORDER — CHLORHEXIDINE GLUCONATE 4 % EX LIQD: 1.0000 "application " | Freq: Once | CUTANEOUS | Status: DC

## 2014-07-11 MED ORDER — BUPIVACAINE HCL (PF) 0.5 % IJ SOLN
INTRAMUSCULAR | Status: DC | PRN
  Administered 2014-07-11: 5 mL

## 2014-07-11 MED ORDER — FENTANYL CITRATE (PF) 100 MCG/2ML IJ SOLN
INTRAMUSCULAR | Status: DC | PRN
  Administered 2014-07-11 (×2): 50 ug via INTRAVENOUS

## 2014-07-11 MED ORDER — HEPARIN SODIUM (PORCINE) 5000 UNIT/ML IJ SOLN
Freq: Once | INTRAMUSCULAR | Status: AC
  Administered 2014-07-11: 10:00:00
  Filled 2014-07-11: qty 1.2

## 2014-07-11 MED ORDER — PROCHLORPERAZINE MALEATE 10 MG PO TABS: 10.0000 mg | ORAL_TABLET | Freq: Four times a day (QID) | ORAL | Status: DC | PRN

## 2014-07-11 MED ORDER — LIDOCAINE HCL (PF) 2 % IJ SOLN
INTRAMUSCULAR | Status: DC | PRN
  Administered 2014-07-11: 20 mg via INTRADERMAL

## 2014-07-11 SURGICAL SUPPLY — 33 items
APL SKNCLS STERI-STRIP NONHPOA (GAUZE/BANDAGES/DRESSINGS) ×1
BAG DECANTER FOR FLEXI CONT (MISCELLANEOUS) ×3 IMPLANT
BENZOIN TINCTURE PRP APPL 2/3 (GAUZE/BANDAGES/DRESSINGS) ×3 IMPLANT
BLADE HEX COATED 2.75 (ELECTRODE) ×3 IMPLANT
BLADE SURG 15 STRL LF DISP TIS (BLADE) ×1 IMPLANT
BLADE SURG 15 STRL SS (BLADE) ×3
CLOSURE WOUND 1/2 X4 (GAUZE/BANDAGES/DRESSINGS)
DECANTER SPIKE VIAL GLASS SM (MISCELLANEOUS) ×3 IMPLANT
DRAPE C-ARM 42X120 X-RAY (DRAPES) ×3 IMPLANT
DRAPE LAPAROTOMY TRNSV 102X78 (DRAPE) ×3 IMPLANT
ELECT REM PT RETURN 9FT ADLT (ELECTROSURGICAL) ×3
ELECTRODE REM PT RTRN 9FT ADLT (ELECTROSURGICAL) ×1 IMPLANT
GAUZE SPONGE 4X4 12PLY STRL (GAUZE/BANDAGES/DRESSINGS) ×3 IMPLANT
GAUZE SPONGE 4X4 16PLY XRAY LF (GAUZE/BANDAGES/DRESSINGS) ×3 IMPLANT
GLOVE BIOGEL PI IND STRL 7.0 (GLOVE) ×1 IMPLANT
GLOVE BIOGEL PI INDICATOR 7.0 (GLOVE) ×2
GOWN STRL REUS W/TWL XL LVL3 (GOWN DISPOSABLE) ×6 IMPLANT
KIT BASIN OR (CUSTOM PROCEDURE TRAY) ×3 IMPLANT
KIT PORT POWER 8FR ISP CVUE (Catheter) ×2 IMPLANT
LIQUID BAND (GAUZE/BANDAGES/DRESSINGS) ×2 IMPLANT
MARKER SKIN DUAL TIP RULER LAB (MISCELLANEOUS) ×3 IMPLANT
NDL HYPO 25X1 1.5 SAFETY (NEEDLE) ×1 IMPLANT
NEEDLE HYPO 22GX1.5 SAFETY (NEEDLE) ×2 IMPLANT
NEEDLE HYPO 25X1 1.5 SAFETY (NEEDLE) ×3 IMPLANT
NS IRRIG 1000ML POUR BTL (IV SOLUTION) ×3 IMPLANT
PACK BASIC VI WITH GOWN DISP (CUSTOM PROCEDURE TRAY) ×3 IMPLANT
PENCIL BUTTON HOLSTER BLD 10FT (ELECTRODE) ×3 IMPLANT
STRIP CLOSURE SKIN 1/2X4 (GAUZE/BANDAGES/DRESSINGS) IMPLANT
SUT MNCRL AB 4-0 PS2 18 (SUTURE) ×3 IMPLANT
SUT PROLENE 2 0 CT2 30 (SUTURE) ×3 IMPLANT
SYR CONTROL 10ML LL (SYRINGE) ×3 IMPLANT
SYRINGE 10CC LL (SYRINGE) ×6 IMPLANT
TOWEL OR 17X26 10 PK STRL BLUE (TOWEL DISPOSABLE) ×3 IMPLANT

## 2014-07-11 NOTE — Op Note (Signed)
Preoperative diagnosis: Cancer of the breast and the poor venous access  Postoperative diagnosis: Same  Procedure: Placement of ClearVue subcutaneous venous port  Surgeon: [name] M.D.  Anesthesia: Local with IV sedation  Description of procedure: Patient is brought to the operating room and placed in the supine position on the operating table. IV sedation was administered. The entire upper chest and neck were widely sterilely prepped and draped. Local anesthesia was used to infiltrate the insertion of port site. I initially was unable to locate the left subclavian vein in this very thin patient. The left internal jugular vein was cannulated with a needle and guidewire without difficulty and position in the superior vena cava was confirmed by fluoroscopy. The introducer was then placed over the guidewire and the flushed catheter placed via the introducer which was stripped away and the tip of the catheter positioned near the cavoatrial junction. A small transverse incision was made in the anterior chest wall and subcutaneous pocket created. The catheter was tunneled into the pocket, trimmed to length, and attached to the flushed port which was positioned in the pocket. The port was sutured to the chest wall with interrupted 2-0 Prolene. The incisions were closed with subcutaneous interrupted Monocryl and the skin incisions closed with subcuticular Monocryl and Dermabond. The port was accessed and flushed and aspirated easily and was left flushed with concentrated heparin solution. Sponge needle as the counts were correct. The patient was taken to recovery in good condition.  Marilyn Dalton T  07/11/2014

## 2014-07-11 NOTE — Telephone Encounter (Signed)
LMOVM - all home medications called in to [facility].  Pt to call clinic if she has any questions.

## 2014-07-11 NOTE — Telephone Encounter (Signed)
lvm for pt regarding to 5.23 appt.Marland KitchenMarland KitchenMarland Kitchen

## 2014-07-11 NOTE — Anesthesia Postprocedure Evaluation (Signed)
  Anesthesia Post-op Note  Patient: Marilyn Dalton  Procedure(s) Performed: Procedure(s): INSERTION PORT-A-CATH (Left)  Patient Location: PACU  Anesthesia Type:MAC  Level of Consciousness: awake and alert   Airway and Oxygen Therapy: Patient Spontanous Breathing  Post-op Pain: none  Post-op Assessment: Post-op Vital signs reviewed  Post-op Vital Signs: stable  Last Vitals:  Filed Vitals:   07/11/14 1114  BP: 129/68  Pulse: 67  Temp: 36.4 C  Resp: 16    Complications: No apparent anesthesia complications

## 2014-07-11 NOTE — Anesthesia Preprocedure Evaluation (Signed)
Anesthesia Evaluation  Patient identified by MRN, date of birth, ID band Patient awake    Reviewed: Allergy & Precautions, NPO status , Patient's Chart, lab work & pertinent test results  Airway Mallampati: I       Dental  (+) Edentulous Upper   Pulmonary Current Smoker,  breath sounds clear to auscultation        Cardiovascular negative cardio ROS  Rhythm:Regular Rate:Normal     Neuro/Psych    GI/Hepatic GERD-  ,  Endo/Other  negative endocrine ROS  Renal/GU negative Renal ROS     Musculoskeletal  (+) Arthritis -,   Abdominal   Peds  Hematology negative hematology ROS (+)   Anesthesia Other Findings   Reproductive/Obstetrics                             Anesthesia Physical Anesthesia Plan  ASA: II  Anesthesia Plan: MAC   Post-op Pain Management:    Induction: Intravenous  Airway Management Planned: Natural Airway and Simple Face Mask  Additional Equipment:   Intra-op Plan:   Post-operative Plan:   Informed Consent: I have reviewed the patients History and Physical, chart, labs and discussed the procedure including the risks, benefits and alternatives for the proposed anesthesia with the patient or authorized representative who has indicated his/her understanding and acceptance.     Plan Discussed with: CRNA and Surgeon  Anesthesia Plan Comments:         Anesthesia Quick Evaluation

## 2014-07-11 NOTE — Progress Notes (Signed)
Gae Bon is faxing form to me. She said it was faxed to our office about 1 today??? She is getting it to me also for prior auth for zofran.

## 2014-07-11 NOTE — Telephone Encounter (Signed)
PT. STARTS CHEMOTHERAPY ON Monday. THIS OFFICE NEEDS TO CALL BLUE CR[employer] AND BLUE [employer]IELD TO OBTAIN A PRIOR AUTHORIZATION. PHONE NUMBER [phone] OPTION #5. THIS INFORMATION WAS GIVEN TO RAQUEL BROWNING IN MANAGED CARE.

## 2014-07-11 NOTE — Progress Notes (Signed)
I placed form for prior auth for zofran on desk of nurse for dr. Lindi Adie.

## 2014-07-11 NOTE — Interval H&P Note (Signed)
History and Physical Interval Note:  07/11/2014 9:22 AM  Marilyn Dalton  has presented today for surgery, with the diagnosis of right breast cance  The various methods of treatment have been discussed with the patient and family. After consideration of risks, benefits and other options for treatment, the patient has consented to  Procedure(s): INSERTION PORT-A-CATH (N/A) as a surgical intervention .  The patient's history has been reviewed, patient examined, no change in status, stable for surgery.  I have reviewed the patient's chart and labs.  Questions were answered to the patient's satisfaction.     Cher Egnor T

## 2014-07-11 NOTE — Transfer of Care (Signed)
Immediate Anesthesia Transfer of Care Note  Patient: Marilyn Dalton  Procedure(s) Performed: Procedure(s): INSERTION PORT-A-CATH (Left)  Patient Location: PACU  Anesthesia Type:MAC  Level of Consciousness:  sedated, patient cooperative and responds to stimulation  Airway & Oxygen Therapy:Patient Spontanous Breathing and Patient connected to face mask oxgen  Post-op Assessment:  Report given to PACU RN and Post -op Vital signs reviewed and stable  Post vital signs:  Reviewed and stable  Last Vitals:  Filed Vitals:   07/11/14 0711  BP: 145/76  Pulse: 95  Temp: 36.7 C  Resp: 16    Complications: No apparent anesthesia complications

## 2014-07-11 NOTE — Discharge Instructions (Signed)
    PORT-A-CATH: POST OP INSTRUCTIONS  Always review your discharge instruction sheet given to you by the facility where your surgery was performed.   1. A prescription for pain medication may be given to you upon discharge. Take your pain medication as prescribed, if needed. If narcotic pain medicine is not needed, then you make take acetaminophen (Tylenol) or ibuprofen (Advil) as needed.  2. Take your usually prescribed medications unless otherwise directed. 3. If you need a refill on your pain medication, please contact our office. All narcotic pain medicine now requires a paper prescription.  Phoned in and fax refills are no longer allowed by law.  Prescriptions will not be filled after 5 pm or on weekends.  4. You should follow a light diet for the remainder of the day after your procedure. 5. Most patients will experience some mild swelling and/or bruising in the area of the incision. It may take several days to resolve. 6. It is common to experience some constipation if taking pain medication after surgery. Increasing fluid intake and taking a stool softener (such as Colace) will usually help or prevent this problem from occurring. A mild laxative (Milk of Magnesia or Miralax) should be taken according to package directions if there are no bowel movements after 48 hours.  7. Unless discharge instructions indicate otherwise, you may remove your bandages 48 hours after surgery, and you may shower at that time. You may have steri-strips (small white skin tapes) in place directly over the incision.  These strips should be left on the skin for 7-10 days.  If your surgeon used Dermabond (skin glue) on the incision, you may shower in 24 hours.  The glue will flake off over the next 2-3 weeks.  8. If your port is left accessed at the end of surgery (needle left in port), the dressing cannot get wet and should only by changed by a healthcare professional. When the port is no longer accessed (when the  needle has been removed), follow step 7.   9. ACTIVITIES:  Limit activity involving your arms for the next 72 hours. Do no strenuous exercise or activity for 1 week. You may drive when you are no longer taking prescription pain medication, you can comfortably wear a seatbelt, and you can maneuver your car. 10.You may need to see your doctor in the office for a follow-up appointment.  Please       check with your doctor.  11.When you receive a new Port-a-Cath, you will get a product guide and        ID card.  Please keep them in case you need them.  WHEN TO CALL YOUR DOCTOR ([phone]): 1. Fever over 101.0 2. Chills 3. Continued bleeding from incision 4. Increased redness and tenderness at the site 5. Shortness of breath, difficulty breathing   The clinic staff is available to answer your questions during regular business hours. Please don't hesitate to call and ask to speak to one of the nurses or medical assistants for clinical concerns. If you have a medical emergency, go to the nearest emergency room or call 911.  A surgeon from [facility] is always on call at the hospital.     For further information, please visit [email or web address]      

## 2014-07-11 NOTE — Progress Notes (Signed)
CXR report called to Dr. [name]. Verbal permission received to discharge patient as planned.  Pt updated as well.  Coolidge Breeze, RN 07/11/2014

## 2014-07-13 NOTE — Assessment & Plan Note (Addendum)
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 Neoadjuvant chemotherapy with Taxotere, carboplatin, Herceptin, Perjeta every 3 weeks 6 cycles followed by Herceptin maintenance every 3 weeks for 1 year 2. After 6 cycles of chemotherapy, breast surgery 3. Followed by radiation therapy 4. Followed by antiestrogen therapy with aromatase inhibitor  Current treatment: Cycle 1 day 1 TCHP  Chemo Monitoring: 1. ECHO 5/17: EF 55-60% Monitoring for toxicities  RTC in 1 week for Tox check

## 2014-07-14 ENCOUNTER — Telehealth: Payer: Self-pay

## 2014-07-14 ENCOUNTER — Encounter: Payer: Self-pay | Admitting: Oncology

## 2014-07-14 ENCOUNTER — Other Ambulatory Visit (HOSPITAL_BASED_OUTPATIENT_CLINIC_OR_DEPARTMENT_OTHER): Payer: Medicare Other

## 2014-07-14 ENCOUNTER — Telehealth: Payer: Self-pay | Admitting: *Deleted

## 2014-07-14 ENCOUNTER — Encounter: Payer: Self-pay | Admitting: Hematology and Oncology

## 2014-07-14 ENCOUNTER — Ambulatory Visit (HOSPITAL_BASED_OUTPATIENT_CLINIC_OR_DEPARTMENT_OTHER): Payer: Medicare Other

## 2014-07-14 ENCOUNTER — Encounter: Payer: Self-pay | Admitting: *Deleted

## 2014-07-14 ENCOUNTER — Ambulatory Visit (HOSPITAL_BASED_OUTPATIENT_CLINIC_OR_DEPARTMENT_OTHER): Payer: Medicare Other | Admitting: Hematology and Oncology

## 2014-07-14 ENCOUNTER — Ambulatory Visit: Payer: Medicare Other | Admitting: Hematology and Oncology

## 2014-07-14 ENCOUNTER — Encounter (HOSPITAL_COMMUNITY): Payer: Self-pay | Admitting: General Surgery

## 2014-07-14 VITALS — BP 143/81 | HR 94 | Temp 97.5°F | Resp 18 | Ht 65.0 in | Wt 106.5 lb

## 2014-07-14 VITALS — BP 114/65 | HR 80 | Temp 98.5°F | Resp 18

## 2014-07-14 DIAGNOSIS — G4700 Insomnia, unspecified: Secondary | ICD-10-CM

## 2014-07-14 DIAGNOSIS — C50811 Malignant neoplasm of overlapping sites of right female breast: Secondary | ICD-10-CM | POA: Diagnosis present

## 2014-07-14 DIAGNOSIS — Z5112 Encounter for antineoplastic immunotherapy: Secondary | ICD-10-CM

## 2014-07-14 DIAGNOSIS — F411 Generalized anxiety disorder: Secondary | ICD-10-CM

## 2014-07-14 DIAGNOSIS — C50211 Malignant neoplasm of upper-inner quadrant of right female breast: Secondary | ICD-10-CM

## 2014-07-14 LAB — CBC WITH DIFFERENTIAL/PLATELET
BASO%: 0.5 % (ref 0.0–2.0)
BASOS ABS: 0.1 10*3/uL (ref 0.0–0.1)
EOS%: 0.3 % (ref 0.0–7.0)
Eosinophils Absolute: 0 10*3/uL (ref 0.0–0.5)
HEMATOCRIT: 44.1 % (ref 34.8–46.6)
HGB: 14.6 g/dL (ref 11.6–15.9)
LYMPH#: 1.1 10*3/uL (ref 0.9–3.3)
LYMPH%: 6.9 % — AB (ref 14.0–49.7)
MCH: 31.8 pg (ref 25.1–34.0)
MCHC: 33.1 g/dL (ref 31.5–36.0)
MCV: 96.1 fL (ref 79.5–101.0)
MONO#: 0.3 10*3/uL (ref 0.1–0.9)
MONO%: 2 % (ref 0.0–14.0)
NEUT#: 14.9 10*3/uL — ABNORMAL HIGH (ref 1.5–6.5)
NEUT%: 90.3 % — ABNORMAL HIGH (ref 38.4–76.8)
PLATELETS: 343 10*3/uL (ref 145–400)
RBC: 4.59 10*6/uL (ref 3.70–5.45)
RDW: 14.9 % — ABNORMAL HIGH (ref 11.2–14.5)
WBC: 16.5 10*3/uL — ABNORMAL HIGH (ref 3.9–10.3)

## 2014-07-14 LAB — COMPREHENSIVE METABOLIC PANEL (CC13)
ALBUMIN: 4 g/dL (ref 3.5–5.0)
ALT: 12 U/L (ref 0–55)
AST: 19 U/L (ref 5–34)
Alkaline Phosphatase: 74 U/L (ref 40–150)
Anion Gap: 13 mEq/L — ABNORMAL HIGH (ref 3–11)
BUN: 15.8 mg/dL (ref 7.0–26.0)
CALCIUM: 9.6 mg/dL (ref 8.4–10.4)
CO2: 23 meq/L (ref 22–29)
Chloride: 103 mEq/L (ref 98–109)
Creatinine: 0.8 mg/dL (ref 0.6–1.1)
EGFR: 76 mL/min/{1.73_m2} — AB (ref >90)
Glucose: 135 mg/dl (ref 70–140)
POTASSIUM: 4.1 meq/L (ref 3.5–5.1)
Sodium: 140 mEq/L (ref 136–145)
TOTAL PROTEIN: 7.5 g/dL (ref 6.4–8.3)
Total Bilirubin: 0.43 mg/dL (ref 0.20–1.20)

## 2014-07-14 MED ORDER — DOCETAXEL CHEMO INJECTION 160 MG/16ML
75.0000 mg/m2 | Freq: Once | INTRAVENOUS | Status: DC
  Filled 2014-07-14: qty 11

## 2014-07-14 MED ORDER — TRASTUZUMAB CHEMO INJECTION 440 MG
8.0000 mg/kg | Freq: Once | INTRAVENOUS | Status: AC
  Administered 2014-07-14: 399 mg via INTRAVENOUS
  Filled 2014-07-14: qty 19

## 2014-07-14 MED ORDER — PEGFILGRASTIM 6 MG/0.6ML ~~LOC~~ PSKT
6.0000 mg | PREFILLED_SYRINGE | Freq: Once | SUBCUTANEOUS | Status: DC
  Filled 2014-07-14: qty 0.6

## 2014-07-14 MED ORDER — SODIUM CHLORIDE 0.9 % IJ SOLN
10.0000 mL | INTRAMUSCULAR | Status: DC | PRN
  Administered 2014-07-14: 10 mL
  Filled 2014-07-14: qty 10

## 2014-07-14 MED ORDER — PALONOSETRON HCL INJECTION 0.25 MG/5ML: 0.2500 mg | Freq: Once | INTRAVENOUS | Status: DC

## 2014-07-14 MED ORDER — SODIUM CHLORIDE 0.9 % IV SOLN
Freq: Once | INTRAVENOUS | Status: DC
  Filled 2014-07-14: qty 1

## 2014-07-14 MED ORDER — SODIUM CHLORIDE 0.9 % IV SOLN
Freq: Once | INTRAVENOUS | Status: AC
  Administered 2014-07-14: 12:00:00 via INTRAVENOUS

## 2014-07-14 MED ORDER — DIPHENHYDRAMINE HCL 25 MG PO CAPS
ORAL_CAPSULE | ORAL | Status: AC
  Filled 2014-07-14: qty 2

## 2014-07-14 MED ORDER — SODIUM CHLORIDE 0.9 % IV SOLN
840.0000 mg | Freq: Once | INTRAVENOUS | Status: AC
  Administered 2014-07-14: 840 mg via INTRAVENOUS
  Filled 2014-07-14: qty 28

## 2014-07-14 MED ORDER — HEPARIN SOD (PORK) LOCK FLUSH 100 UNIT/ML IV SOLN
500.0000 [IU] | Freq: Once | INTRAVENOUS | Status: AC | PRN
  Administered 2014-07-14: 500 [IU]
  Filled 2014-07-14: qty 5

## 2014-07-14 MED ORDER — ACETAMINOPHEN 325 MG PO TABS
ORAL_TABLET | ORAL | Status: AC
  Filled 2014-07-14: qty 2

## 2014-07-14 MED ORDER — ACETAMINOPHEN 325 MG PO TABS
650.0000 mg | ORAL_TABLET | Freq: Once | ORAL | Status: AC
  Administered 2014-07-14: 650 mg via ORAL

## 2014-07-14 MED ORDER — DIPHENHYDRAMINE HCL 25 MG PO CAPS
50.0000 mg | ORAL_CAPSULE | Freq: Once | ORAL | Status: AC
  Administered 2014-07-14: 50 mg via ORAL

## 2014-07-14 MED ORDER — SODIUM CHLORIDE 0.9 % IV SOLN
381.6000 mg | Freq: Once | INTRAVENOUS | Status: DC
  Filled 2014-07-14: qty 38

## 2014-07-14 NOTE — Progress Notes (Signed)
I placed prior auth form for ondansetron on desk of nurse for dr. Lindi Adie.

## 2014-07-14 NOTE — Progress Notes (Signed)
Met with patient today at her 1 st chemo treatment.  She is doing well and ready to get started.  There has some issues with getting her zofran approved.  Checked with her pharmacy and it has been approved and will be ready for her to pick up today.  Informed patient.  Encouraged her to call with any needs or concerns.

## 2014-07-14 NOTE — Progress Notes (Signed)
Recd note that [employer] approved ondansetron 07/14/14-07/14/15. The patient has already been advised. See prev notes of me putting form on desk of nurse.

## 2014-07-14 NOTE — Telephone Encounter (Signed)
Call report rcvd from [facility] about pt appt today.  Sent to scan.

## 2014-07-14 NOTE — Telephone Encounter (Signed)
Prior authorization received from [facility] for Ondansetron 8 mg tablets.  Request to Managed Care.

## 2014-07-14 NOTE — Progress Notes (Signed)
[employer] prior authorization form for zofran faxed to [facility].  Original with transmission log returned to managed care.

## 2014-07-14 NOTE — Patient Instructions (Addendum)
[facility] Discharge Instructions for Patients   Today you received the following: Herceptin and Perjeta.   To help prevent nausea and vomiting after your treatment, we encourage you to take your nausea medication as directed.   If you develop nausea and vomiting that is not controlled by your nausea medication, call the clinic.   BELOW ARE SYMPTOMS THAT SHOULD BE REPORTED IMMEDIATELY:  *FEVER GREATER THAN 100.5 F  *CHILLS WITH OR WITHOUT FEVER  NAUSEA AND VOMITING THAT IS NOT CONTROLLED WITH YOUR NAUSEA MEDICATION  *UNUSUAL SHORTNESS OF BREATH  *UNUSUAL BRUISING OR BLEEDING  TENDERNESS IN MOUTH AND THROAT WITH OR WITHOUT PRESENCE OF ULCERS  *URINARY PROBLEMS  *BOWEL PROBLEMS  UNUSUAL RASH Items with * indicate a potential emergency and should be followed up as soon as possible.  Feel free to call the clinic you have any questions or concerns. The clinic phone number is [phone].  Please show the [place] at check-in to the Emergency Department and triage nurse.  Trastuzumab injection for infusion What is this medicine? TRASTUZUMAB (tras TOO zoo mab) is a monoclonal antibody. It targets a protein called HER2. This protein is found in some stomach and breast cancers. This medicine can stop cancer cell growth. This medicine may be used with other cancer treatments. This medicine may be used for other purposes; ask your health care provider or pharmacist if you have questions. COMMON BRAND NAME(S): Herceptin What should I tell my health care provider before I take this medicine? They need to know if you have any of these conditions: -heart disease -heart failure -infection (especially a virus infection such as chickenpox, cold sores, or herpes) -lung or breathing disease, like asthma -recent or ongoing radiation therapy -an unusual or allergic reaction to trastuzumab, benzyl alcohol, or other medications, foods, dyes, or  preservatives -pregnant or trying to get pregnant -breast-feeding How should I use this medicine? This drug is given as an infusion into a vein. It is administered in a hospital or clinic by a specially trained health care professional. Talk to your pediatrician regarding the use of this medicine in children. This medicine is not approved for use in children. Overdosage: If you think you have taken too much of this medicine contact a poison control center or emergency room at once. NOTE: This medicine is only for you. Do not share this medicine with others. What if I miss a dose? It is important not to miss a dose. Call your doctor or health care professional if you are unable to keep an appointment. What may interact with this medicine? -cyclophosphamide -doxorubicin -warfarin This list may not describe all possible interactions. Give your health care provider a list of all the medicines, herbs, non-prescription drugs, or dietary supplements you use. Also tell them if you smoke, drink alcohol, or use illegal drugs. Some items may interact with your medicine. What should I watch for while using this medicine? Visit your doctor for checks on your progress. Report any side effects. Continue your course of treatment even though you feel ill unless your doctor tells you to stop. Call your doctor or health care professional for advice if you get a fever, chills or sore throat, or other symptoms of a cold or flu. Do not treat yourself. Try to avoid being around people who are sick. You may experience fever, chills and shaking during your first infusion. These effects are usually mild and can be treated with other medicines. Report any side effects during  the infusion to your health care professional. Fever and chills usually do not happen with later infusions. What side effects may I notice from receiving this medicine? Side effects that you should report to your doctor or other health care  professional as soon as possible: -breathing difficulties -chest pain or palpitations -cough -dizziness or fainting -fever or chills, sore throat -skin rash, itching or hives -swelling of the legs or ankles -unusually weak or tired Side effects that usually do not require medical attention (report to your doctor or other health care professional if they continue or are bothersome): -loss of appetite -headache -muscle aches -nausea This list may not describe all possible side effects. Call your doctor for medical advice about side effects. You may report side effects to FDA at 1-800-FDA-1088. Where should I keep my medicine? This drug is given in a hospital or clinic and will not be stored at home. NOTE: This sheet is a summary. It may not cover all possible information. If you have questions about this medicine, talk to your doctor, pharmacist, or health care provider.  2015, Elsevier/Gold Standard. (2008-12-12 13:43:15)   Pertuzumab injection What is this medicine? PERTUZUMAB (per TOOZ ue mab) is a monoclonal antibody that targets a protein called HER2. HER2 is found in some breast cancers. This medicine can stop cancer cell growth. This medicine is used with other cancer treatments. This medicine may be used for other purposes; ask your health care provider or pharmacist if you have questions. COMMON BRAND NAME(S): PERJETA What should I tell my health care provider before I take this medicine? They need to know if you have any of these conditions: -heart disease -heart failure -high blood pressure -history of irregular heart beat -recent or ongoing radiation therapy -an unusual or allergic reaction to pertuzumab, other medicines, foods, dyes, or preservatives -pregnant or trying to get pregnant -breast-feeding How should I use this medicine? This medicine is for infusion into a vein. It is given by a health care professional in a hospital or clinic setting. Talk to your  pediatrician regarding the use of this medicine in children. Special care may be needed. Overdosage: If you think you've taken too much of this medicine contact a poison control center or emergency room at once. Overdosage: If you think you have taken too much of this medicine contact a poison control center or emergency room at once. NOTE: This medicine is only for you. Do not share this medicine with others. What if I miss a dose? It is important not to miss your dose. Call your doctor or health care professional if you are unable to keep an appointment. What may interact with this medicine? Interactions are not expected. Give your health care provider a list of all the medicines, herbs, non-prescription drugs, or dietary supplements you use. Also tell them if you smoke, drink alcohol, or use illegal drugs. Some items may interact with your medicine. This list may not describe all possible interactions. Give your health care provider a list of all the medicines, herbs, non-prescription drugs, or dietary supplements you use. Also tell them if you smoke, drink alcohol, or use illegal drugs. Some items may interact with your medicine. What should I watch for while using this medicine? Your condition will be monitored carefully while you are receiving this medicine. Report any side effects. Continue your course of treatment even though you feel ill unless your doctor tells you to stop. Do not become pregnant while taking this medicine. Women should inform their  doctor if they wish to become pregnant or think they might be pregnant. There is a potential for serious side effects to an unborn child. Talk to your health care professional or pharmacist for more information. Do not breast-feed an infant while taking this medicine. Call your doctor or health care professional for advice if you get a fever, chills or sore throat, or other symptoms of a cold or flu. Do not treat yourself. Try to avoid being around  people who are sick. You may experience fever, chills, and headache during the infusion. Report any side effects during the infusion to your health care professional. What side effects may I notice from receiving this medicine? Side effects that you should report to your doctor or health care professional as soon as possible: -breathing problems -chest pain or palpitations -dizziness -feeling faint or lightheaded -fever or chills -skin rash, itching or hives -sore throat -swelling of the face, lips, or tongue -swelling of the legs or ankles -unusually weak or tired Side effects that usually do not require medical attention (Report these to your doctor or health care professional if they continue or are bothersome.): -diarrhea -hair loss -nausea, vomiting -tiredness This list may not describe all possible side effects. Call your doctor for medical advice about side effects. You may report side effects to FDA at 1-800-FDA-1088. Where should I keep my medicine? This drug is given in a hospital or clinic and will not be stored at home. NOTE: This sheet is a summary. It may not cover all possible information. If you have questions about this medicine, talk to your doctor, pharmacist, or health care provider.  2015, Elsevier/Gold Standard. (2011-12-07 16:54:15)

## 2014-07-14 NOTE — Progress Notes (Signed)
Patient Care Team: [name], MD as PCP - General (Family Medicine)  DIAGNOSIS: No matching staging information was found for the patient.  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 Today is cycle 1 day 1 of treatment. She underwent a port placement as well as echocardiogram. The echo came back normal. She complains of constipation today. Also has difficulty with sleep because of anxiety.  REVIEW OF SYSTEMS:   Constitutional: Denies fevers, chills or abnormal weight loss Eyes: Denies blurriness of vision Ears, nose, mouth, throat, and face: Denies mucositis or sore throat Respiratory: Denies cough, dyspnea or wheezes Cardiovascular: Denies palpitation, chest discomfort or lower extremity swelling Gastrointestinal:  Denies nausea, heartburn or change in bowel habits Skin: Denies abnormal skin rashes Lymphatics: Denies new lymphadenopathy or easy bruising Neurological:Denies numbness, tingling or new weaknesses Behavioral/Psych: Anxiety and difficulty with sleep  All other systems were reviewed with the patient and are negative.  I have reviewed the past medical history, past surgical history, social history and family history with the patient and they are unchanged from previous note.  ALLERGIES:  has No Known Allergies.  MEDICATIONS:  Current Outpatient Prescriptions  Medication Sig Dispense Refill  .  azithromycin (ZITHROMAX) 250 MG tablet Take 250 mg by mouth daily.   0  . dexamethasone (DECADRON) 4 MG tablet Take 1 tablet (4 mg total) by mouth 2 (two) times daily. Start the day before Taxotere. Then again the day after chemo for 3 days. 30 tablet 1  . fluticasone (FLONASE) 50 MCG/ACT nasal spray   0  . glycerin, Pediatric, 1.2 G SUPP Place 1 suppository rectally daily as needed for moderate constipation.    [name] HYDROcodone-acetaminophen (NORCO/VICODIN) 5-325 MG per tablet Take 1-2 tablets by mouth every 4 (four) hours as needed for moderate pain or severe pain. 25 tablet 0  . lidocaine-prilocaine (EMLA) cream Apply to affected area once 30 g 3  . LORazepam (ATIVAN) 0.5 MG tablet Take 1 tablet (0.5 mg total) by mouth at bedtime. 30 tablet 0  . naproxen sodium (ANAPROX) 220 MG tablet Take 220 mg by mouth as needed.    . ondansetron (ZOFRAN) 8 MG tablet Take 1 tablet (8 mg total) by mouth 2 (two) times daily. Start the day after chemo for 3 days. Then take as needed for nausea or vomiting. 30 tablet 1  . prochlorperazine (COMPAZINE) 10 MG tablet Take 1 tablet (10 mg total) by mouth every 6 (six) hours as needed (Nausea or vomiting). 30 tablet 1   No current facility-administered medications for this visit.    PHYSICAL EXAMINATION: ECOG PERFORMANCE STATUS: 1 - Symptomatic but completely ambulatory  Filed Vitals:   07/14/14 1000  BP: 143/81  Pulse: 94  Temp: 97.5 F (36.4 C)  Resp: 18   Filed Weights   07/14/14 1000  Weight: 106 lb 8 oz (48.308 kg)    GENERAL:alert, no distress and comfortable SKIN: skin color, texture, turgor are normal, no  rashes or significant lesions EYES: normal, Conjunctiva are pink and non-injected, sclera clear OROPHARYNX:no exudate, no erythema and lips, buccal mucosa, and tongue normal  NECK: supple, thyroid normal size, non-tender, without nodularity LYMPH:  no palpable lymphadenopathy in the cervical, axillary or inguinal LUNGS: clear to auscultation and  percussion with normal breathing effort HEART: regular rate & rhythm and no murmurs and no lower extremity edema ABDOMEN:abdomen soft, non-tender and normal bowel sounds Musculoskeletal:no cyanosis of digits and no clubbing  NEURO: alert & oriented x 3 with fluent speech, no focal motor/sensory deficits, anxiety   LABORATORY DATA:  I have reviewed the data as listed   Chemistry      Component Value Date/Time   NA 140 07/14/2014 0938   NA 139 07/10/2014 1120   K 4.1 07/14/2014 0938   K 4.8 07/10/2014 1120   CL 104 07/10/2014 1120   CO2 23 07/14/2014 0938   CO2 27 07/10/2014 1120   BUN 15.8 07/14/2014 0938   BUN 23* 07/10/2014 1120   CREATININE 0.8 07/14/2014 0938   CREATININE 0.62 07/10/2014 1120      Component Value Date/Time   CALCIUM 9.6 07/14/2014 0938   CALCIUM 10.1 07/10/2014 1120   ALKPHOS 74 07/14/2014 0938   ALKPHOS 70 07/10/2014 1120   AST 19 07/14/2014 0938   AST 25 07/10/2014 1120   ALT 12 07/14/2014 0938   ALT 19 07/10/2014 1120   BILITOT 0.43 07/14/2014 0938   BILITOT 0.5 07/10/2014 1120       Lab Results  Component Value Date   WBC 16.5* 07/14/2014   HGB 14.6 07/14/2014   HCT 44.1 07/14/2014   MCV 96.1 07/14/2014   PLT 343 07/14/2014   NEUTROABS 14.9* 07/14/2014     RADIOGRAPHIC STUDIES: I have personally reviewed the radiology reports and agreed with their findings. 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 Neoadjuvant chemotherapy with Taxotere, carboplatin, Herceptin, Perjeta every 3 weeks 6 cycles followed by Herceptin maintenance every 3 weeks for 1 year 2. After 6 cycles of chemotherapy, breast  surgery 3. Followed by radiation therapy 4. Followed by antiestrogen therapy with aromatase inhibitor  Current treatment: Cycle 1 day 1 TCHP  Chemo Monitoring: 1. ECHO 5/17: EF 55-60% I reviewed her blood work and they're adequate for treatment. I reviewed her antiemetic regimen and prescriptions as well.  2.anxiety/insomnia: Instructed her to take Ativan as needed at bedtime.    Monitoring for toxicities  RTC in 1 week for Tox check and subsequently she can be seen only on the day of each treatment. She will not need a 1 week posttreatment follow-up from cycle 2 onwards.   No orders of the defined types were placed in this encounter.   The patient has a good understanding of the overall plan. she agrees with it. she will call with any problems that may develop before the next visit here.   Rulon Eisenmenger, MD

## 2014-07-14 NOTE — Telephone Encounter (Signed)
TC from [facility] with approval for Ondansetron for 1 year.  07/14/14 to 06/2315

## 2014-07-14 NOTE — Progress Notes (Signed)
Due to scheduling, patient unable to receive first time Taxotere and Carboplatin today 5/23.  Rescheduled for 5/24, patient and pharmacy aware. Follow up call placed for 5/25.   1622: Pt preferred pac stay accessed for tomorrow's treatment. PAC flushed and dressing changed with antimicrobial disc placed.

## 2014-07-15 ENCOUNTER — Ambulatory Visit (HOSPITAL_BASED_OUTPATIENT_CLINIC_OR_DEPARTMENT_OTHER): Payer: Medicare Other

## 2014-07-15 ENCOUNTER — Other Ambulatory Visit: Payer: Self-pay | Admitting: *Deleted

## 2014-07-15 VITALS — BP 140/86 | HR 79 | Temp 98.1°F | Resp 16

## 2014-07-15 DIAGNOSIS — C50211 Malignant neoplasm of upper-inner quadrant of right female breast: Secondary | ICD-10-CM

## 2014-07-15 DIAGNOSIS — C50811 Malignant neoplasm of overlapping sites of right female breast: Secondary | ICD-10-CM

## 2014-07-15 DIAGNOSIS — Z5189 Encounter for other specified aftercare: Secondary | ICD-10-CM | POA: Diagnosis not present

## 2014-07-15 DIAGNOSIS — Z5111 Encounter for antineoplastic chemotherapy: Secondary | ICD-10-CM | POA: Diagnosis present

## 2014-07-15 MED ORDER — SODIUM CHLORIDE 0.9 % IV SOLN
381.6000 mg | Freq: Once | INTRAVENOUS | Status: AC
  Administered 2014-07-15: 380 mg via INTRAVENOUS
  Filled 2014-07-15: qty 38

## 2014-07-15 MED ORDER — SODIUM CHLORIDE 0.9 % IV SOLN
Freq: Once | INTRAVENOUS | Status: AC
  Administered 2014-07-15: 12:00:00 via INTRAVENOUS

## 2014-07-15 MED ORDER — HEPARIN SOD (PORK) LOCK FLUSH 100 UNIT/ML IV SOLN
500.0000 [IU] | Freq: Once | INTRAVENOUS | Status: AC | PRN
  Administered 2014-07-15: 500 [IU]
  Filled 2014-07-15: qty 5

## 2014-07-15 MED ORDER — PEGFILGRASTIM 6 MG/0.6ML ~~LOC~~ PSKT
6.0000 mg | PREFILLED_SYRINGE | Freq: Once | SUBCUTANEOUS | Status: AC
  Administered 2014-07-15: 6 mg via SUBCUTANEOUS
  Filled 2014-07-15: qty 0.6

## 2014-07-15 MED ORDER — DEXAMETHASONE SODIUM PHOSPHATE 100 MG/10ML IJ SOLN
Freq: Once | INTRAMUSCULAR | Status: AC
Start: 2014-07-15 — End: 2014-07-15
  Administered 2014-07-15: 12:00:00 via INTRAVENOUS
  Filled 2014-07-15: qty 1

## 2014-07-15 MED ORDER — SODIUM CHLORIDE 0.9 % IJ SOLN
10.0000 mL | INTRAMUSCULAR | Status: DC | PRN
  Administered 2014-07-15: 10 mL
  Filled 2014-07-15: qty 10

## 2014-07-15 MED ORDER — DOCETAXEL CHEMO INJECTION 160 MG/16ML
75.0000 mg/m2 | Freq: Once | INTRAVENOUS | Status: AC
  Administered 2014-07-15: 110 mg via INTRAVENOUS
  Filled 2014-07-15: qty 11

## 2014-07-15 MED ORDER — PALONOSETRON HCL INJECTION 0.25 MG/5ML
0.2500 mg | Freq: Once | INTRAVENOUS | Status: AC
  Administered 2014-07-15: 0.25 mg via INTRAVENOUS

## 2014-07-15 MED ORDER — PALONOSETRON HCL INJECTION 0.25 MG/5ML
INTRAVENOUS | Status: AC
  Filled 2014-07-15: qty 5

## 2014-07-15 NOTE — Patient Instructions (Signed)
Docetaxel injection What is this medicine? DOCETAXEL (doe se TAX el) is a chemotherapy drug. It targets fast dividing cells, like cancer cells, and causes these cells to die. This medicine is used to treat many types of cancers like breast cancer, certain stomach cancers, head and neck cancer, lung cancer, and prostate cancer. This medicine may be used for other purposes; ask your health care provider or pharmacist if you have questions. COMMON BRAND NAME(S): Docefrez, Taxotere What should I tell my health care provider before I take this medicine? They need to know if you have any of these conditions: -infection (especially a virus infection such as chickenpox, cold sores, or herpes) -liver disease -low blood counts, like low white cell, platelet, or red cell counts -an unusual or allergic reaction to docetaxel, polysorbate 80, other chemotherapy agents, other medicines, foods, dyes, or preservatives -pregnant or trying to get pregnant -breast-feeding How should I use this medicine? This drug is given as an infusion into a vein. It is administered in a hospital or clinic by a specially trained health care professional. Talk to your pediatrician regarding the use of this medicine in children. Special care may be needed. Overdosage: If you think you have taken too much of this medicine contact a poison control center or emergency room at once. NOTE: This medicine is only for you. Do not share this medicine with others. What if I miss a dose? It is important not to miss your dose. Call your doctor or health care professional if you are unable to keep an appointment. What may interact with this medicine? -cyclosporine -erythromycin -ketoconazole -medicines to increase blood counts like filgrastim, pegfilgrastim, sargramostim -vaccines Talk to your doctor or health care professional before taking any of these medicines: -acetaminophen -aspirin -ibuprofen -ketoprofen -naproxen This list  may not describe all possible interactions. Give your health care provider a list of all the medicines, herbs, non-prescription drugs, or dietary supplements you use. Also tell them if you smoke, drink alcohol, or use illegal drugs. Some items may interact with your medicine. What should I watch for while using this medicine? Your condition will be monitored carefully while you are receiving this medicine. You will need important blood work done while you are taking this medicine. This drug may make you feel generally unwell. This is not uncommon, as chemotherapy can affect healthy cells as well as cancer cells. Report any side effects. Continue your course of treatment even though you feel ill unless your doctor tells you to stop. In some cases, you may be given additional medicines to help with side effects. Follow all directions for their use. Call your doctor or health care professional for advice if you get a fever, chills or sore throat, or other symptoms of a cold or flu. Do not treat yourself. This drug decreases your body's ability to fight infections. Try to avoid being around people who are sick. This medicine may increase your risk to bruise or bleed. Call your doctor or health care professional if you notice any unusual bleeding. Be careful brushing and flossing your teeth or using a toothpick because you may get an infection or bleed more easily. If you have any dental work done, tell your dentist you are receiving this medicine. Avoid taking products that contain aspirin, acetaminophen, ibuprofen, naproxen, or ketoprofen unless instructed by your doctor. These medicines may hide a fever. This medicine contains an alcohol in the product. You may get drowsy or dizzy. Do not drive, use machinery, or do anything  that needs mental alertness until you know how this medicine affects you. Do not stand or sit up quickly, especially if you are an older patient. This reduces the risk of dizzy or  fainting spells. Avoid alcoholic drinks Do not become pregnant while taking this medicine. Women should inform their doctor if they wish to become pregnant or think they might be pregnant. There is a potential for serious side effects to an unborn child. Talk to your health care professional or pharmacist for more information. Do not breast-feed an infant while taking this medicine. What side effects may I notice from receiving this medicine? Side effects that you should report to your doctor or health care professional as soon as possible: -allergic reactions like skin rash, itching or hives, swelling of the face, lips, or tongue -low blood counts - This drug may decrease the number of white blood cells, red blood cells and platelets. You may be at increased risk for infections and bleeding. -signs of infection - fever or chills, cough, sore throat, pain or difficulty passing urine -signs of decreased platelets or bleeding - bruising, pinpoint red spots on the skin, black, tarry stools, nosebleeds -signs of decreased red blood cells - unusually weak or tired, fainting spells, lightheadedness -breathing problems -fast or irregular heartbeat -low blood pressure -mouth sores -nausea and vomiting -pain, swelling, redness or irritation at the injection site -pain, tingling, numbness in the hands or feet -swelling of the ankle, feet, hands -weight gain Side effects that usually do not require medical attention (report to your prescriber or health care professional if they continue or are bothersome): -bone pain -complete hair loss including hair on your head, underarms, pubic hair, eyebrows, and eyelashes -diarrhea -excessive tearing -changes in the color of fingernails -loosening of the fingernails -nausea -muscle pain -red flush to skin -sweating -weak or tired This list may not describe all possible side effects. Call your doctor for medical advice about side effects. You may report side  effects to FDA at 1-800-FDA-1088. Where should I keep my medicine? This drug is given in a hospital or clinic and will not be stored at home. NOTE: This sheet is a summary. It may not cover all possible information. If you have questions about this medicine, talk to your doctor, pharmacist, or health care provider.  2015, Elsevier/Gold Standard. (2013-01-03 22:21:02)  Carboplatin injection What is this medicine? CARBOPLATIN (KAR boe pla tin) is a chemotherapy drug. It targets fast dividing cells, like cancer cells, and causes these cells to die. This medicine is used to treat ovarian cancer and many other cancers. This medicine may be used for other purposes; ask your health care provider or pharmacist if you have questions. COMMON BRAND NAME(S): Paraplatin What should I tell my health care provider before I take this medicine? They need to know if you have any of these conditions: -blood disorders -hearing problems -kidney disease -recent or ongoing radiation therapy -an unusual or allergic reaction to carboplatin, cisplatin, other chemotherapy, other medicines, foods, dyes, or preservatives -pregnant or trying to get pregnant -breast-feeding How should I use this medicine? This drug is usually given as an infusion into a vein. It is administered in a hospital or clinic by a specially trained health care professional. Talk to your pediatrician regarding the use of this medicine in children. Special care may be needed. Overdosage: If you think you have taken too much of this medicine contact a poison control center or emergency room at once. NOTE: This medicine is only  for you. Do not share this medicine with others. What if I miss a dose? It is important not to miss a dose. Call your doctor or health care professional if you are unable to keep an appointment. What may interact with this medicine? -medicines for seizures -medicines to increase blood counts like filgrastim,  pegfilgrastim, sargramostim -some antibiotics like amikacin, gentamicin, neomycin, streptomycin, tobramycin -vaccines Talk to your doctor or health care professional before taking any of these medicines: -acetaminophen -aspirin -ibuprofen -ketoprofen -naproxen This list may not describe all possible interactions. Give your health care provider a list of all the medicines, herbs, non-prescription drugs, or dietary supplements you use. Also tell them if you smoke, drink alcohol, or use illegal drugs. Some items may interact with your medicine. What should I watch for while using this medicine? Your condition will be monitored carefully while you are receiving this medicine. You will need important blood work done while you are taking this medicine. This drug may make you feel generally unwell. This is not uncommon, as chemotherapy can affect healthy cells as well as cancer cells. Report any side effects. Continue your course of treatment even though you feel ill unless your doctor tells you to stop. In some cases, you may be given additional medicines to help with side effects. Follow all directions for their use. Call your doctor or health care professional for advice if you get a fever, chills or sore throat, or other symptoms of a cold or flu. Do not treat yourself. This drug decreases your body's ability to fight infections. Try to avoid being around people who are sick. This medicine may increase your risk to bruise or bleed. Call your doctor or health care professional if you notice any unusual bleeding. Be careful brushing and flossing your teeth or using a toothpick because you may get an infection or bleed more easily. If you have any dental work done, tell your dentist you are receiving this medicine. Avoid taking products that contain aspirin, acetaminophen, ibuprofen, naproxen, or ketoprofen unless instructed by your doctor. These medicines may hide a fever. Do not become pregnant while  taking this medicine. Women should inform their doctor if they wish to become pregnant or think they might be pregnant. There is a potential for serious side effects to an unborn child. Talk to your health care professional or pharmacist for more information. Do not breast-feed an infant while taking this medicine. What side effects may I notice from receiving this medicine? Side effects that you should report to your doctor or health care professional as soon as possible: -allergic reactions like skin rash, itching or hives, swelling of the face, lips, or tongue -signs of infection - fever or chills, cough, sore throat, pain or difficulty passing urine -signs of decreased platelets or bleeding - bruising, pinpoint red spots on the skin, black, tarry stools, nosebleeds -signs of decreased red blood cells - unusually weak or tired, fainting spells, lightheadedness -breathing problems -changes in hearing -changes in vision -chest pain -high blood pressure -low blood counts - This drug may decrease the number of white blood cells, red blood cells and platelets. You may be at increased risk for infections and bleeding. -nausea and vomiting -pain, swelling, redness or irritation at the injection site -pain, tingling, numbness in the hands or feet -problems with balance, talking, walking -trouble passing urine or change in the amount of urine Side effects that usually do not require medical attention (report to your doctor or health care professional if  they continue or are bothersome): -hair loss -loss of appetite -metallic taste in the mouth or changes in taste This list may not describe all possible side effects. Call your doctor for medical advice about side effects. You may report side effects to FDA at 1-800-FDA-1088. Where should I keep my medicine? This drug is given in a hospital or clinic and will not be stored at home. NOTE: This sheet is a summary. It may not cover all possible  information. If you have questions about this medicine, talk to your doctor, pharmacist, or health care provider.  2015, Elsevier/Gold Standard. (2007-05-15 14:38:05)  [facility] Discharge Instructions for Patients Receiving Chemotherapy  Today you received the following chemotherapy agents Taxotere/Carboplatin.  To help prevent nausea and vomiting after your treatment, we encourage you to take your nausea medication as directed.   If you develop nausea and vomiting that is not controlled by your nausea medication, call the clinic.   BELOW ARE SYMPTOMS THAT SHOULD BE REPORTED IMMEDIATELY:  *FEVER GREATER THAN 100.5 F  *CHILLS WITH OR WITHOUT FEVER  NAUSEA AND VOMITING THAT IS NOT CONTROLLED WITH YOUR NAUSEA MEDICATION  *UNUSUAL SHORTNESS OF BREATH  *UNUSUAL BRUISING OR BLEEDING  TENDERNESS IN MOUTH AND THROAT WITH OR WITHOUT PRESENCE OF ULCERS  *URINARY PROBLEMS  *BOWEL PROBLEMS  UNUSUAL RASH Items with * indicate a potential emergency and should be followed up as soon as possible.  Feel free to call the clinic you have any questions or concerns. The clinic phone number is [phone].  Please show the [place] at check-in to the Emergency Department and triage nurse.

## 2014-07-16 ENCOUNTER — Telehealth: Payer: Self-pay | Admitting: *Deleted

## 2014-07-16 ENCOUNTER — Encounter (HOSPITAL_COMMUNITY): Payer: Self-pay | Admitting: General Surgery

## 2014-07-16 NOTE — Telephone Encounter (Signed)
Patient received 1st TCHP. Denies any nausea, vomiting or diarrhea. States that she is eating and drinking well. Advised to call with any questions or concerns. She verbalized understanding.

## 2014-07-17 ENCOUNTER — Encounter (HOSPITAL_COMMUNITY): Payer: Self-pay | Admitting: *Deleted

## 2014-07-17 ENCOUNTER — Emergency Department (HOSPITAL_COMMUNITY)
Admission: EM | Admit: 2014-07-17 | Discharge: 2014-07-18 | Disposition: A | Discharge status: Home / Self Care | Payer: Medicare Other | Attending: Emergency Medicine | Admitting: Emergency Medicine

## 2014-07-17 DIAGNOSIS — Z7952 Long term (current) use of systemic steroids: Secondary | ICD-10-CM | POA: Diagnosis not present

## 2014-07-17 DIAGNOSIS — M1990 Unspecified osteoarthritis, unspecified site: Secondary | ICD-10-CM | POA: Diagnosis not present

## 2014-07-17 DIAGNOSIS — Z720 Tobacco use: Secondary | ICD-10-CM | POA: Insufficient documentation

## 2014-07-17 DIAGNOSIS — K5900 Constipation, unspecified: Secondary | ICD-10-CM | POA: Insufficient documentation

## 2014-07-17 DIAGNOSIS — Z79899 Other long term (current) drug therapy: Secondary | ICD-10-CM | POA: Insufficient documentation

## 2014-07-17 DIAGNOSIS — C50911 Malignant neoplasm of unspecified site of right female breast: Secondary | ICD-10-CM | POA: Diagnosis not present

## 2014-07-17 DIAGNOSIS — R109 Unspecified abdominal pain: Secondary | ICD-10-CM

## 2014-07-17 NOTE — ED Notes (Signed)
Pt states that she has been constipated for 5 days; pt states that she had chemo on Mon and Tues and was told not to take anything that the chemo would likely give her diarrhea; pt states that it has not and now she is having abd pain and nausea

## 2014-07-18 ENCOUNTER — Encounter (HOSPITAL_COMMUNITY): Payer: Self-pay

## 2014-07-18 ENCOUNTER — Emergency Department (HOSPITAL_COMMUNITY): Payer: Medicare Other

## 2014-07-18 DIAGNOSIS — K5900 Constipation, unspecified: Secondary | ICD-10-CM | POA: Diagnosis not present

## 2014-07-18 LAB — I-STAT CHEM 8, ED
BUN: 19 mg/dL (ref 6–20)
CHLORIDE: 102 mmol/L (ref 101–111)
Calcium, Ion: 1.21 mmol/L (ref 1.13–1.30)
Creatinine, Ser: 0.7 mg/dL (ref 0.44–1.00)
Glucose, Bld: 90 mg/dL (ref 65–99)
HCT: 45 % (ref 36.0–46.0)
HEMOGLOBIN: 15.3 g/dL — AB (ref 12.0–15.0)
POTASSIUM: 3.6 mmol/L (ref 3.5–5.1)
Sodium: 140 mmol/L (ref 135–145)
TCO2: 22 mmol/L (ref 0–100)

## 2014-07-18 LAB — CBC WITH DIFFERENTIAL/PLATELET
BASOS ABS: 0.1 10*3/uL (ref 0.0–0.1)
Basophils Relative: 0 % (ref 0–1)
EOS ABS: 0 10*3/uL (ref 0.0–0.7)
Eosinophils Relative: 0 % (ref 0–5)
HEMATOCRIT: 38.7 % (ref 36.0–46.0)
HEMOGLOBIN: 13.4 g/dL (ref 12.0–15.0)
Lymphocytes Relative: 6 % — ABNORMAL LOW (ref 12–46)
Lymphs Abs: 2.5 10*3/uL (ref 0.7–4.0)
MCH: 33.2 pg (ref 26.0–34.0)
MCHC: 34.6 g/dL (ref 30.0–36.0)
MCV: 95.8 fL (ref 78.0–100.0)
Monocytes Absolute: 0.4 10*3/uL (ref 0.1–1.0)
Monocytes Relative: 1 % — ABNORMAL LOW (ref 3–12)
Neutro Abs: 37.4 10*3/uL — ABNORMAL HIGH (ref 1.7–7.7)
Neutrophils Relative %: 93 % — ABNORMAL HIGH (ref 43–77)
PLATELETS: 240 10*3/uL (ref 150–400)
RBC: 4.04 MIL/uL (ref 3.87–5.11)
RDW: 14.4 % (ref 11.5–15.5)
WBC: 40.4 10*3/uL — ABNORMAL HIGH (ref 4.0–10.5)

## 2014-07-18 MED ORDER — IOHEXOL 300 MG/ML  SOLN
50.0000 mL | Freq: Once | INTRAMUSCULAR | Status: AC | PRN
  Administered 2014-07-18: 50 mL via ORAL

## 2014-07-18 MED ORDER — SODIUM CHLORIDE 0.9 % IV BOLUS (SEPSIS)
1000.0000 mL | Freq: Once | INTRAVENOUS | Status: AC
  Administered 2014-07-18: 1000 mL via INTRAVENOUS

## 2014-07-18 MED ORDER — IOHEXOL 300 MG/ML  SOLN
100.0000 mL | Freq: Once | INTRAMUSCULAR | Status: AC | PRN
  Administered 2014-07-18: 100 mL via INTRAVENOUS

## 2014-07-18 MED ORDER — MINERAL OIL RE ENEM: 1.0000 | ENEMA | Freq: Once | RECTAL | Status: DC

## 2014-07-18 NOTE — ED Notes (Signed)
Patient is alert and oriented x3.  She was given DC instructions and follow up visit instructions.  Patient gave verbal understanding. She was DC ambulatory under her own power to home.  V/S stable.  He was not showing any signs of distress on DC 

## 2014-07-18 NOTE — ED Provider Notes (Signed)
CSN: [id number]     Arrival date & time 07/17/14  2032 History   First MD Initiated Contact with Patient 07/18/14 0008     Chief Complaint  Patient presents with  . Abdominal Pain  . Constipation     (Consider location/radiation/quality/duration/timing/severity/associated sxs/prior Treatment) HPI Comments: 74 y/o comes in with cc of abd pain. Pt has hx of breast CA, on chemo and radiation and also hx of hysterectomy. She reports no BM since Saturday, and increased bloating and discomfort in the upper quadrants. Pt has some bilious regurgitation with po intake, and not passing flatus.   ROS 10 Systems reviewed and are negative for acute change except as noted in the HPI.   Patient is a 74 y.o. female presenting with abdominal pain and constipation. The history is provided by the patient.  Abdominal Pain Associated symptoms: constipation   Constipation Associated symptoms: abdominal pain     Past Medical History  Diagnosis Date  . Arthritis   . Breast cancer 06/20/14    right breast  . Allergy   . GERD (gastroesophageal reflux disease)   . Diverticulitis    Past Surgical History  Procedure Laterality Date  . Colon surgery  10/13/08    cecum polyp=adenomatous ,no high grade dysplasia or invasive malignancy  . Breast biopsy Left 10/6/210    no malignancy,extensive stromal fibrosis  . Breast biopsy Right 06/20/14    invasive ductal ca,dcis   . Hand surgery      RIGHT  . Knee arthroscopy      RT  . Tonsillectomy    . Appendectomy    . Portacath placement Left 07/11/2014    Procedure: INSERTION PORT-A-CATH;  Surgeon: [name], MD;  Location: [facility];  Service: General;  Laterality: Left;   No family history on file. History  Substance Use Topics  . Smoking status: Current Every Day Smoker  . Smokeless tobacco: Not on file  . Alcohol Use: Yes     Comment: OCCASIONAL   OB History    No data available     Review of Systems  Gastrointestinal: Positive for  abdominal pain and constipation.  All other systems reviewed and are negative.     Allergies  Review of patient's allergies indicates no known allergies.  Home Medications   Prior to Admission medications   Medication Sig Start Date End Date Taking? Authorizing Provider  dexamethasone (DECADRON) 4 MG tablet Take 1 tablet (4 mg total) by mouth 2 (two) times daily. Start the day before Taxotere. Then again the day after chemo for 3 days. 07/11/14  Yes Nicholas Lose, MD  docusate sodium (COLACE) 100 MG capsule Take 100 mg by mouth 3 (three) times daily as needed for mild constipation (constipation).   Yes Historical Provider, MD  lidocaine-prilocaine (EMLA) cream Apply to affected area once 07/11/14  Yes Nicholas Lose, MD  LORazepam (ATIVAN) 0.5 MG tablet Take 1 tablet (0.5 mg total) by mouth at bedtime. 07/09/14  Yes Nicholas Lose, MD  ondansetron (ZOFRAN) 8 MG tablet Take 1 tablet (8 mg total) by mouth 2 (two) times daily. Start the day after chemo for 3 days. Then take as needed for nausea or vomiting. 07/11/14  Yes Nicholas Lose, MD  polyethylene glycol (MIRALAX / GLYCOLAX) packet Take 17 g by mouth 3 (three) times daily as needed for mild constipation (constipation).   Yes Historical Provider, MD  HYDROcodone-acetaminophen (NORCO/VICODIN) 5-325 MG per tablet Take 1-2 tablets by mouth every 4 (four) hours as needed for moderate  pain or severe pain. 07/11/14   [name], MD  mineral oil enema Place 133 mLs (1 enema total) rectally once. 07/18/14   Varney Biles, MD  prochlorperazine (COMPAZINE) 10 MG tablet Take 1 tablet (10 mg total) by mouth every 6 (six) hours as needed (Nausea or vomiting). 07/11/14   Nicholas Lose, MD   BP 121/69 mmHg  Pulse 87  Temp(Src) 98.3 F (36.8 C) (Oral)  Resp 16  SpO2 97% Physical Exam  Constitutional: She is oriented to person, place, and time. She appears well-developed and well-nourished.  HENT:  Head: Normocephalic and atraumatic.  Eyes: EOM are  normal. Pupils are equal, round, and reactive to light.  Neck: Neck supple.  Cardiovascular: Normal rate and regular rhythm.   Pulmonary/Chest: Effort normal. No respiratory distress.  Abdominal: Soft. She exhibits no distension. There is tenderness. There is no rebound and no guarding.  Epigastric tenderness  Neurological: She is alert and oriented to person, place, and time.  Skin: Skin is warm and dry.  Nursing note and vitals reviewed.   ED Course  Procedures (including critical care time) Labs Review Labs Reviewed  CBC WITH DIFFERENTIAL/PLATELET - Abnormal; Notable for the following:    WBC 40.4 (*)    Neutrophils Relative % 93 (*)    Neutro Abs 37.4 (*)    Lymphocytes Relative 6 (*)    Monocytes Relative 1 (*)    All other components within normal limits  I-STAT CHEM 8, ED - Abnormal; Notable for the following:    Hemoglobin 15.3 (*)    All other components within normal limits    Imaging Review Ct Abdomen Pelvis W Contrast  07/18/2014   CLINICAL DATA:  Constipation with abdominal pain.  EXAM: CT ABDOMEN AND PELVIS WITH CONTRAST  TECHNIQUE: Multidetector CT imaging of the abdomen and pelvis was performed using the standard protocol following bolus administration of intravenous contrast.  CONTRAST:  159mL OMNIPAQUE IOHEXOL 300 MG/ML  SOLN  COMPARISON:  None.  FINDINGS: BODY WALL: No contributory findings.  LOWER CHEST: Opacities in the left greater than right lower lobes has an appearance most consistent with atelectasis. No acute findings  ABDOMEN/PELVIS:  Liver: Probable sub cm cyst in segment 4B. No acute or significant findings.  Biliary: No evidence of biliary obstruction or stone.  Pancreas: Unremarkable.  Spleen: Unremarkable.  Adrenals: Unremarkable.  Kidneys and ureters: 3 mm nonobstructing stone in the lower pole right kidney. No hydronephrosis or ureteral calculus.  Bladder: Unremarkable.  Reproductive: Hysterectomy.  Probable oophorectomies.  Bowel: Formed stool  throughout the colon, distending the colon proximally. No small bowel obstruction. Appendectomy.  Retroperitoneum: No mass or adenopathy.  Peritoneum: No ascites or pneumoperitoneum.  Vascular: No acute abnormality.  OSSEOUS: Advanced degenerative disc change at L4-5 and L5-S1. No acute osseous findings.  IMPRESSION: 1. Large volume stool consistent with history of constipation. No bowel obstruction. 2. Right nephrolithiasis.   Electronically Signed   By: Monte Fantasia M.D.   On: 07/18/2014 03:06     EKG Interpretation None      MDM   Final diagnoses:  Constipation  Abdominal pain    Pt with abd pain. She is not passing flatus, has hx of abd surgery and has active cancer. CT abd ordered and is + for constipation, and infection and obstruction have been ruled out. She has elevated WC - she is on decadron, and also reports given medicine to stimulate WC. No fevers, or other SIRs criteria, no source of infection on hx alone.  Will message her Oncologist to take a look at the results and to ensure there is no abnormality. Will d.c.   Varney Biles, MD 07/18/14 [phone]

## 2014-07-18 NOTE — ED Notes (Signed)
Patient request blood draws from her port.

## 2014-07-18 NOTE — Discharge Instructions (Signed)
We saw you in the ER for the abdominal discomfort, emesis. All the results in the ER are normal, labs and imaging. You do have constipation - and we think enema might be worthwhile to see if they help.   Constipation Constipation is when a person has fewer than three bowel movements a week, has difficulty having a bowel movement, or has stools that are dry, hard, or larger than normal. As people grow older, constipation is more common. If you try to fix constipation with medicines that make you have a bowel movement (laxatives), the problem may get worse. Long-term laxative use may cause the muscles of the colon to become weak. A low-fiber diet, not taking in enough fluids, and taking certain medicines may make constipation worse.  CAUSES   Certain medicines, such as antidepressants, pain medicine, iron supplements, antacids, and water pills.   Certain diseases, such as diabetes, irritable bowel syndrome (IBS), thyroid disease, or depression.   Not drinking enough water.   Not eating enough fiber-rich foods.   Stress or travel.   Lack of physical activity or exercise.   Ignoring the urge to have a bowel movement.   Using laxatives too much.  SIGNS AND SYMPTOMS   Having fewer than three bowel movements a week.   Straining to have a bowel movement.   Having stools that are hard, dry, or larger than normal.   Feeling full or bloated.   Pain in the lower abdomen.   Not feeling relief after having a bowel movement.  DIAGNOSIS  Your health care provider will take a medical history and perform a physical exam. Further testing may be done for severe constipation. Some tests may include:  A barium enema X-ray to examine your rectum, colon, and, sometimes, your small intestine.   A sigmoidoscopy to examine your lower colon.   A colonoscopy to examine your entire colon. TREATMENT  Treatment will depend on the severity of your constipation and what is causing it. Some  dietary treatments include drinking more fluids and eating more fiber-rich foods. Lifestyle treatments may include regular exercise. If these diet and lifestyle recommendations do not help, your health care provider may recommend taking over-the-counter laxative medicines to help you have bowel movements. Prescription medicines may be prescribed if over-the-counter medicines do not work.  HOME CARE INSTRUCTIONS   Eat foods that have a lot of fiber, such as fruits, vegetables, whole grains, and beans.  Limit foods high in fat and processed sugars, such as french fries, hamburgers, cookies, candies, and soda.   A fiber supplement may be added to your diet if you cannot get enough fiber from foods.   Drink enough fluids to keep your urine clear or pale yellow.   Exercise regularly or as directed by your health care provider.   Go to the restroom when you have the urge to go. Do not hold it.   Only take over-the-counter or prescription medicines as directed by your health care provider. Do not take other medicines for constipation without talking to your health care provider first.  [place] IF:   You have bright red blood in your stool.   Your constipation lasts for more than 4 days or gets worse.   You have abdominal or rectal pain.   You have thin, pencil-like stools.   You have unexplained weight loss. MAKE SURE YOU:   Understand these instructions.  Will watch your condition.  Will get help right away if you are not doing  well or get worse. Document Released: 11/06/2003 Document Revised: 02/12/2013 Document Reviewed: 11/19/2012 [facility] Patient Information 2015 [place], [place]. This information is not intended to replace advice given to you by your health care provider. Make sure you discuss any questions you have with your health care provider.

## 2014-07-22 ENCOUNTER — Other Ambulatory Visit (HOSPITAL_BASED_OUTPATIENT_CLINIC_OR_DEPARTMENT_OTHER): Payer: Medicare Other

## 2014-07-22 ENCOUNTER — Ambulatory Visit (HOSPITAL_BASED_OUTPATIENT_CLINIC_OR_DEPARTMENT_OTHER): Payer: Medicare Other | Admitting: Oncology

## 2014-07-22 ENCOUNTER — Encounter: Payer: Self-pay | Admitting: Oncology

## 2014-07-22 VITALS — BP 127/83 | HR 100 | Temp 98.0°F | Resp 8 | Ht 65.0 in | Wt 102.3 lb

## 2014-07-22 DIAGNOSIS — G4700 Insomnia, unspecified: Secondary | ICD-10-CM | POA: Diagnosis not present

## 2014-07-22 DIAGNOSIS — C50811 Malignant neoplasm of overlapping sites of right female breast: Secondary | ICD-10-CM

## 2014-07-22 DIAGNOSIS — F411 Generalized anxiety disorder: Secondary | ICD-10-CM

## 2014-07-22 DIAGNOSIS — C50211 Malignant neoplasm of upper-inner quadrant of right female breast: Secondary | ICD-10-CM

## 2014-07-22 LAB — COMPREHENSIVE METABOLIC PANEL (CC13)
ALT: 14 U/L (ref 0–55)
AST: 18 U/L (ref 5–34)
Albumin: 3.8 g/dL (ref 3.5–5.0)
Alkaline Phosphatase: 95 U/L (ref 40–150)
Anion Gap: 10 mEq/L (ref 3–11)
BUN: 8.4 mg/dL (ref 7.0–26.0)
CALCIUM: 9.6 mg/dL (ref 8.4–10.4)
CO2: 24 mEq/L (ref 22–29)
CREATININE: 0.8 mg/dL (ref 0.6–1.1)
Chloride: 103 mEq/L (ref 98–109)
EGFR: 71 mL/min/{1.73_m2} — ABNORMAL LOW (ref >90)
GLUCOSE: 134 mg/dL (ref 70–140)
POTASSIUM: 4.1 meq/L (ref 3.5–5.1)
Sodium: 137 mEq/L (ref 136–145)
TOTAL PROTEIN: 7 g/dL (ref 6.4–8.3)

## 2014-07-22 LAB — CBC WITH DIFFERENTIAL/PLATELET
BASO%: 0.7 % (ref 0.0–2.0)
BASOS ABS: 0.1 10*3/uL (ref 0.0–0.1)
EOS%: 0.5 % (ref 0.0–7.0)
Eosinophils Absolute: 0.1 10*3/uL (ref 0.0–0.5)
HEMATOCRIT: 43.9 % (ref 34.8–46.6)
HGB: 14.6 g/dL (ref 11.6–15.9)
LYMPH%: 16.2 % (ref 14.0–49.7)
MCH: 32 pg (ref 25.1–34.0)
MCHC: 33.3 g/dL (ref 31.5–36.0)
MCV: 96 fL (ref 79.5–101.0)
MONO#: 1.6 10*3/uL — ABNORMAL HIGH (ref 0.1–0.9)
MONO%: 11.9 % (ref 0.0–14.0)
NEUT#: 9.5 10*3/uL — ABNORMAL HIGH (ref 1.5–6.5)
NEUT%: 70.7 % (ref 38.4–76.8)
Platelets: 328 10*3/uL (ref 145–400)
RBC: 4.57 10*6/uL (ref 3.70–5.45)
RDW: 14.2 % (ref 11.2–14.5)
WBC: 13.4 10*3/uL — ABNORMAL HIGH (ref 3.9–10.3)
lymph#: 2.2 10*3/uL (ref 0.9–3.3)

## 2014-07-22 NOTE — Progress Notes (Signed)
Patient Care Team: [name], MD as PCP - General (Family Medicine)  DIAGNOSIS: No matching staging information was found for the patient.  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 Today is cycle 1 day 9 of treatment. She was seen in the ER last week due to constipation. On stool softeners 3X/day and has used magnesium citrate with improvement.   REVIEW OF SYSTEMS:   Constitutional: Denies fevers, chills or abnormal weight loss Eyes: Denies blurriness of vision Ears, nose, mouth, throat, and face: Denies mucositis or sore throat Respiratory: Denies cough, dyspnea or wheezes Cardiovascular: Denies palpitation, chest discomfort or lower extremity swelling Gastrointestinal:  Denies nausea, heartburn or change in bowel habits Skin: Denies abnormal skin rashes Lymphatics: Denies new lymphadenopathy or easy bruising Neurological:Denies numbness, tingling or new weaknesses Behavioral/Psych: Anxiety and difficulty with sleep  All other systems were reviewed with the patient and are negative.  I have reviewed the past medical history, past surgical history, social history and family history with the patient and they are unchanged from previous note.  ALLERGIES:  has No Known Allergies.  MEDICATIONS:  Current Outpatient Prescriptions  Medication Sig Dispense Refill  . dexamethasone (DECADRON) 4 MG tablet Take 1 tablet  (4 mg total) by mouth 2 (two) times daily. Start the day before Taxotere. Then again the day after chemo for 3 days. 30 tablet 1  . docusate sodium (COLACE) 100 MG capsule Take 100 mg by mouth 3 (three) times daily as needed for mild constipation (constipation).    [name] lidocaine-prilocaine (EMLA) cream Apply to affected area once 30 g 3  . LORazepam (ATIVAN) 0.5 MG tablet Take 1 tablet (0.5 mg total) by mouth at bedtime. 30 tablet 0  . mineral oil enema Place 133 mLs (1 enema total) rectally once. 266 mL 0  . polyethylene glycol (MIRALAX / GLYCOLAX) packet Take 17 g by mouth 3 (three) times daily as needed for mild constipation (constipation).    . prochlorperazine (COMPAZINE) 10 MG tablet Take 1 tablet (10 mg total) by mouth every 6 (six) hours as needed (Nausea or vomiting). 30 tablet 1  . HYDROcodone-acetaminophen (NORCO/VICODIN) 5-325 MG per tablet Take 1-2 tablets by mouth every 4 (four) hours as needed for moderate pain or severe pain. (Patient not taking: Reported on 07/22/2014) 25 tablet 0  . ondansetron (ZOFRAN) 8 MG tablet Take 1 tablet (8 mg total) by mouth 2 (two) times daily. Start the day after chemo for 3 days. Then take as needed for nausea or vomiting. (Patient not taking: Reported on 07/22/2014) 30 tablet 1   No current facility-administered medications for this visit.    PHYSICAL EXAMINATION: ECOG PERFORMANCE STATUS: 1 - Symptomatic but completely ambulatory  Filed Vitals:   07/22/14 1432  BP: 127/83  Pulse: 100  Temp: 98 F (36.7 C)  Resp: 8   Filed Weights   07/22/14 1432  Weight: 102 lb 4.8 oz (46.403 kg)    GENERAL:alert, no  distress and comfortable SKIN: skin color, texture, turgor are normal, no rashes or significant lesions EYES: normal, Conjunctiva are pink and non-injected, sclera clear OROPHARYNX:no exudate, no erythema and lips, buccal mucosa, and tongue normal  NECK: supple, thyroid normal size, non-tender, without nodularity LYMPH:  no palpable  lymphadenopathy in the cervical, axillary or inguinal LUNGS: clear to auscultation and percussion with normal breathing effort HEART: regular rate & rhythm and no murmurs and no lower extremity edema ABDOMEN:abdomen soft, non-tender and normal bowel sounds Musculoskeletal:no cyanosis of digits and no clubbing  NEURO: alert & oriented x 3 with fluent speech, no focal motor/sensory deficits, anxiety   LABORATORY DATA:  I have reviewed the data as listed   Chemistry      Component Value Date/Time   NA 140 07/18/2014 0151   NA 140 07/14/2014 0938   K 3.6 07/18/2014 0151   K 4.1 07/14/2014 0938   CL 102 07/18/2014 0151   CO2 23 07/14/2014 0938   CO2 27 07/10/2014 1120   BUN 19 07/18/2014 0151   BUN 15.8 07/14/2014 0938   CREATININE 0.70 07/18/2014 0151   CREATININE 0.8 07/14/2014 0938      Component Value Date/Time   CALCIUM 9.6 07/14/2014 0938   CALCIUM 10.1 07/10/2014 1120   ALKPHOS 74 07/14/2014 0938   ALKPHOS 70 07/10/2014 1120   AST 19 07/14/2014 0938   AST 25 07/10/2014 1120   ALT 12 07/14/2014 0938   ALT 19 07/10/2014 1120   BILITOT 0.43 07/14/2014 0938   BILITOT 0.5 07/10/2014 1120       Lab Results  Component Value Date   WBC 13.4* 07/22/2014   HGB 14.6 07/22/2014   HCT 43.9 07/22/2014   MCV 96.0 07/22/2014   PLT 328 07/22/2014   NEUTROABS 9.5* 07/22/2014     RADIOGRAPHIC STUDIES: I have personally reviewed the radiology reports and agreed with their findings. 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 Neoadjuvant chemotherapy with Taxotere, carboplatin, Herceptin, Perjeta every 3 weeks 6 cycles followed by  Herceptin maintenance every 3 weeks for 1 year 2. After 6 cycles of chemotherapy, breast surgery 3. Followed by radiation therapy 4. Followed by antiestrogen therapy with aromatase inhibitor  Current treatment: Cycle 1 day 9 TCHP  Chemo Monitoring: 1. ECHO 5/17: EF 55-60%  2.anxiety/insomnia: Uses Ativan as needed at bedtime.     3.  Constipation: Improved with  Stool softeners. Plans to begin MiraLax as well.   Tolerated first cycle of chemo well with the exception of constipation. Counts are stable at this time.   RTC in 2 weeks prior to cycle 2. Subsequently she can be seen only on the day of each treatment. She will not need a 1 week posttreatment follow-up from cycle 2 onwards.   No orders of the defined types were placed in this encounter.   The patient has a good understanding of the overall plan. she agrees with it. she will call with any problems that may develop before the next visit here.   Mikey Bussing, NP

## 2014-07-23 ENCOUNTER — Telehealth: Payer: Self-pay | Admitting: *Deleted

## 2014-07-23 ENCOUNTER — Telehealth: Payer: Self-pay | Admitting: Oncology

## 2014-07-23 NOTE — Telephone Encounter (Signed)
Per staff message and POF I have scheduled appts. Advised scheduler of appts. JMW  

## 2014-07-23 NOTE — Telephone Encounter (Signed)
Patient's husband called wanting results from 5/31 visit and appts. Stated that nothing was showing up in [facility]. Left VMM on his cell that it takes a few days for results to show up in MyChart and he may call back in a few days if they do not show up.

## 2014-07-23 NOTE — Telephone Encounter (Signed)
Lft msg for pt confirming labs/ov per 06/01 P[facility] sent msg to add chemo and mailed pt updated schedule... KJ

## 2014-08-04 ENCOUNTER — Ambulatory Visit (HOSPITAL_BASED_OUTPATIENT_CLINIC_OR_DEPARTMENT_OTHER): Payer: Medicare Other

## 2014-08-04 ENCOUNTER — Ambulatory Visit (HOSPITAL_BASED_OUTPATIENT_CLINIC_OR_DEPARTMENT_OTHER): Payer: Medicare Other | Admitting: Oncology

## 2014-08-04 ENCOUNTER — Other Ambulatory Visit: Payer: Self-pay | Admitting: Oncology

## 2014-08-04 ENCOUNTER — Encounter: Payer: Self-pay | Admitting: *Deleted

## 2014-08-04 ENCOUNTER — Encounter: Payer: Self-pay | Admitting: Oncology

## 2014-08-04 ENCOUNTER — Other Ambulatory Visit (HOSPITAL_BASED_OUTPATIENT_CLINIC_OR_DEPARTMENT_OTHER): Payer: Medicare Other

## 2014-08-04 VITALS — BP 142/86 | HR 93 | Temp 98.1°F | Resp 18 | Ht 65.0 in | Wt 108.5 lb

## 2014-08-04 DIAGNOSIS — C50211 Malignant neoplasm of upper-inner quadrant of right female breast: Secondary | ICD-10-CM

## 2014-08-04 DIAGNOSIS — Z5111 Encounter for antineoplastic chemotherapy: Secondary | ICD-10-CM | POA: Diagnosis present

## 2014-08-04 DIAGNOSIS — Z5112 Encounter for antineoplastic immunotherapy: Secondary | ICD-10-CM

## 2014-08-04 DIAGNOSIS — F411 Generalized anxiety disorder: Secondary | ICD-10-CM

## 2014-08-04 DIAGNOSIS — C50811 Malignant neoplasm of overlapping sites of right female breast: Secondary | ICD-10-CM

## 2014-08-04 DIAGNOSIS — K5900 Constipation, unspecified: Secondary | ICD-10-CM | POA: Diagnosis not present

## 2014-08-04 DIAGNOSIS — G4700 Insomnia, unspecified: Secondary | ICD-10-CM | POA: Diagnosis not present

## 2014-08-04 LAB — COMPREHENSIVE METABOLIC PANEL (CC13)
ALT: 21 U/L (ref 0–55)
ANION GAP: 10 meq/L (ref 3–11)
AST: 23 U/L (ref 5–34)
Albumin: 3.9 g/dL (ref 3.5–5.0)
Alkaline Phosphatase: 79 U/L (ref 40–150)
BUN: 12.1 mg/dL (ref 7.0–26.0)
CALCIUM: 9.7 mg/dL (ref 8.4–10.4)
CO2: 25 mEq/L (ref 22–29)
Chloride: 107 mEq/L (ref 98–109)
Creatinine: 0.8 mg/dL (ref 0.6–1.1)
EGFR: 76 mL/min/{1.73_m2} — ABNORMAL LOW (ref >90)
GLUCOSE: 119 mg/dL (ref 70–140)
POTASSIUM: 5 meq/L (ref 3.5–5.1)
Sodium: 142 mEq/L (ref 136–145)
Total Bilirubin: 0.36 mg/dL (ref 0.20–1.20)
Total Protein: 7.3 g/dL (ref 6.4–8.3)

## 2014-08-04 LAB — CBC WITH DIFFERENTIAL/PLATELET
BASO%: 0.7 % (ref 0.0–2.0)
BASOS ABS: 0.1 10*3/uL (ref 0.0–0.1)
EOS%: 0 % (ref 0.0–7.0)
Eosinophils Absolute: 0 10*3/uL (ref 0.0–0.5)
HCT: 39.4 % (ref 34.8–46.6)
HGB: 13.1 g/dL (ref 11.6–15.9)
LYMPH%: 8.5 % — AB (ref 14.0–49.7)
MCH: 32.2 pg (ref 25.1–34.0)
MCHC: 33.3 g/dL (ref 31.5–36.0)
MCV: 96.6 fL (ref 79.5–101.0)
MONO#: 0.9 10*3/uL (ref 0.1–0.9)
MONO%: 7.3 % (ref 0.0–14.0)
NEUT#: 10.9 10*3/uL — ABNORMAL HIGH (ref 1.5–6.5)
NEUT%: 83.5 % — AB (ref 38.4–76.8)
Platelets: 306 10*3/uL (ref 145–400)
RBC: 4.08 10*6/uL (ref 3.70–5.45)
RDW: 15 % — ABNORMAL HIGH (ref 11.2–14.5)
WBC: 13 10*3/uL — AB (ref 3.9–10.3)
lymph#: 1.1 10*3/uL (ref 0.9–3.3)

## 2014-08-04 MED ORDER — DIPHENHYDRAMINE HCL 25 MG PO CAPS
ORAL_CAPSULE | ORAL | Status: AC
  Filled 2014-08-04: qty 1

## 2014-08-04 MED ORDER — SODIUM CHLORIDE 0.9 % IV SOLN
420.0000 mg | Freq: Once | INTRAVENOUS | Status: AC
  Administered 2014-08-04: 420 mg via INTRAVENOUS
  Filled 2014-08-04: qty 14

## 2014-08-04 MED ORDER — DOCETAXEL CHEMO INJECTION 160 MG/16ML
75.0000 mg/m2 | Freq: Once | INTRAVENOUS | Status: AC
  Administered 2014-08-04: 110 mg via INTRAVENOUS
  Filled 2014-08-04: qty 11

## 2014-08-04 MED ORDER — PALONOSETRON HCL INJECTION 0.25 MG/5ML
0.2500 mg | Freq: Once | INTRAVENOUS | Status: AC
  Administered 2014-08-04: 0.25 mg via INTRAVENOUS

## 2014-08-04 MED ORDER — HEPARIN SOD (PORK) LOCK FLUSH 100 UNIT/ML IV SOLN
500.0000 [IU] | Freq: Once | INTRAVENOUS | Status: AC | PRN
  Administered 2014-08-04: 500 [IU]
  Filled 2014-08-04: qty 5

## 2014-08-04 MED ORDER — ACETAMINOPHEN 325 MG PO TABS
ORAL_TABLET | ORAL | Status: AC
  Filled 2014-08-04: qty 2

## 2014-08-04 MED ORDER — SODIUM CHLORIDE 0.9 % IV SOLN
381.6000 mg | Freq: Once | INTRAVENOUS | Status: AC
  Administered 2014-08-04: 380 mg via INTRAVENOUS
  Filled 2014-08-04: qty 38

## 2014-08-04 MED ORDER — PALONOSETRON HCL INJECTION 0.25 MG/5ML
INTRAVENOUS | Status: AC
  Filled 2014-08-04: qty 5

## 2014-08-04 MED ORDER — DIPHENHYDRAMINE HCL 25 MG PO CAPS
25.0000 mg | ORAL_CAPSULE | Freq: Once | ORAL | Status: AC
  Administered 2014-08-04: 25 mg via ORAL

## 2014-08-04 MED ORDER — SODIUM CHLORIDE 0.9 % IV SOLN
Freq: Once | INTRAVENOUS | Status: AC
  Administered 2014-08-04: 12:00:00 via INTRAVENOUS
  Filled 2014-08-04: qty 1

## 2014-08-04 MED ORDER — SODIUM CHLORIDE 0.9 % IV SOLN
250.0000 mL | Freq: Once | INTRAVENOUS | Status: AC
  Administered 2014-08-04: 250 mL via INTRAVENOUS

## 2014-08-04 MED ORDER — PEGFILGRASTIM 6 MG/0.6ML ~~LOC~~ PSKT
6.0000 mg | PREFILLED_SYRINGE | Freq: Once | SUBCUTANEOUS | Status: AC
  Administered 2014-08-04: 6 mg via SUBCUTANEOUS
  Filled 2014-08-04: qty 0.6

## 2014-08-04 MED ORDER — SODIUM CHLORIDE 0.9 % IV SOLN
6.0000 mg/kg | Freq: Once | INTRAVENOUS | Status: AC
  Administered 2014-08-04: 294 mg via INTRAVENOUS
  Filled 2014-08-04: qty 14

## 2014-08-04 MED ORDER — SODIUM CHLORIDE 0.9 % IJ SOLN
10.0000 mL | INTRAMUSCULAR | Status: DC | PRN
  Administered 2014-08-04: 10 mL
  Filled 2014-08-04: qty 10

## 2014-08-04 MED ORDER — ACETAMINOPHEN 325 MG PO TABS
650.0000 mg | ORAL_TABLET | Freq: Once | ORAL | Status: AC
  Administered 2014-08-04: 650 mg via ORAL

## 2014-08-04 NOTE — Progress Notes (Signed)
Patient Care Team: [name], MD as PCP - General (Family Medicine)  DIAGNOSIS: No matching staging information was found for the patient.  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 Today is cycle 2 day 1 of treatment. She feels well overall today with the exception of intermittent headaches which she states did not really bother her. No visual changes associated with her headaches. She has not try to take any Tylenol or eye improvement as they have not been that bad. She has already started taking stool softeners in anticipation of her chemotherapy as she had significant constipation with her first cycle.  REVIEW OF SYSTEMS:   Constitutional: Denies fevers, chills or abnormal weight loss Eyes: Denies blurriness of vision Ears, nose, mouth, throat, and face: Denies mucositis or sore throat Respiratory: Denies cough, dyspnea or wheezes Cardiovascular: Denies palpitation, chest discomfort or lower extremity swelling Gastrointestinal:  Denies nausea, heartburn or change in bowel habits Skin: Denies abnormal skin rashes Lymphatics: Denies new lymphadenopathy or easy bruising Neurological:Denies numbness, tingling or new weaknesses Behavioral/Psych: Anxiety and difficulty with sleep  All other systems were reviewed with the patient and are negative.  I have reviewed the past medical history, past surgical history, social  history and family history with the patient and they are unchanged from previous note.  ALLERGIES:  has No Known Allergies.  MEDICATIONS:  Current Outpatient Prescriptions  Medication Sig Dispense Refill  . dexamethasone (DECADRON) 4 MG tablet Take 1 tablet (4 mg total) by mouth 2 (two) times daily. Start the day before Taxotere. Then again the day after chemo for 3 days. 30 tablet 1  . docusate sodium (COLACE) 100 MG capsule Take 100 mg by mouth 3 (three) times daily as needed for mild constipation (constipation).    [name] HYDROcodone-acetaminophen (NORCO/VICODIN) 5-325 MG per tablet Take 1-2 tablets by mouth every 4 (four) hours as needed for moderate pain or severe pain. 25 tablet 0  . lidocaine-prilocaine (EMLA) cream Apply to affected area once 30 g 3  . LORazepam (ATIVAN) 0.5 MG tablet Take 1 tablet (0.5 mg total) by mouth at bedtime. 30 tablet 0  . mineral oil enema Place 133 mLs (1 enema total) rectally once. 266 mL 0  . ondansetron (ZOFRAN) 8 MG tablet Take 1 tablet (8 mg total) by mouth 2 (two) times daily. Start the day after chemo for 3 days. Then take as needed for nausea or vomiting. 30 tablet 1  . polyethylene glycol (MIRALAX / GLYCOLAX) packet Take 17 g by mouth 3 (three) times daily as needed for mild constipation (constipation).    . prochlorperazine (COMPAZINE) 10 MG tablet Take 1 tablet (10 mg total) by mouth every 6 (six) hours as needed (Nausea or vomiting). 30 tablet 1   No current facility-administered medications for this visit.   Facility-Administered Medications Ordered in Other Visits  Medication Dose Route Frequency Provider Last Rate Last Dose  . CARBOplatin (PARAPLATIN) 380 mg in sodium chloride  0.9 % 100 mL chemo infusion  380 mg Intravenous Once Nicholas Lose, MD      . DOCEtaxel (TAXOTERE) 110 mg in dextrose 5 % 250 mL chemo infusion  75 mg/m2 (Treatment Plan Actual) Intravenous Once Nicholas Lose, MD      . heparin lock flush 100 unit/mL  500 Units Intracatheter  Once PRN Nicholas Lose, MD      . pegfilgrastim (NEULASTA ONPRO KIT) injection 6 mg  6 mg Subcutaneous Once Nicholas Lose, MD      . pertuzumab (PERJETA) 420 mg in sodium chloride 0.9 % 250 mL chemo infusion  420 mg Intravenous Once Chauncey Cruel, MD      . sodium chloride 0.9 % injection 10 mL  10 mL Intracatheter PRN Nicholas Lose, MD        PHYSICAL EXAMINATION: ECOG PERFORMANCE STATUS: 1 - Symptomatic but completely ambulatory  Filed Vitals:   08/04/14 1100  BP: 142/86  Pulse: 93  Temp: 98.1 F (36.7 C)  Resp: 18   Filed Weights   08/04/14 1100  Weight: 108 lb 8 oz (49.215 kg)    GENERAL:alert, no distress and comfortable SKIN: skin color, texture, turgor are normal, no rashes or significant lesions EYES: normal, Conjunctiva are pink and non-injected, sclera clear OROPHARYNX:no exudate, no erythema and lips, buccal mucosa, and tongue normal  NECK: supple, thyroid normal size, non-tender, without nodularity LYMPH:  no palpable lymphadenopathy in the cervical, axillary or inguinal LUNGS: clear to auscultation and percussion with normal breathing effort HEART: regular rate & rhythm and no murmurs and no lower extremity edema ABDOMEN:abdomen soft, non-tender and normal bowel sounds Musculoskeletal:no cyanosis of digits and no clubbing  NEURO: alert & oriented x 3 with fluent speech, no focal motor/sensory deficits, anxiety   LABORATORY DATA:  I have reviewed the data as listed   Chemistry      Component Value Date/Time   NA 142 08/04/2014 1049   NA 140 07/18/2014 0151   K 5.0 08/04/2014 1049   K 3.6 07/18/2014 0151   CL 102 07/18/2014 0151   CO2 25 08/04/2014 1049   CO2 27 07/10/2014 1120   BUN 12.1 08/04/2014 1049   BUN 19 07/18/2014 0151   CREATININE 0.8 08/04/2014 1049   CREATININE 0.70 07/18/2014 0151      Component Value Date/Time   CALCIUM 9.7 08/04/2014 1049   CALCIUM 10.1 07/10/2014 1120   ALKPHOS 79 08/04/2014 1049   ALKPHOS 70 07/10/2014 1120   AST  23 08/04/2014 1049   AST 25 07/10/2014 1120   ALT 21 08/04/2014 1049   ALT 19 07/10/2014 1120   BILITOT 0.36 08/04/2014 1049   BILITOT 0.5 07/10/2014 1120       Lab Results  Component Value Date   WBC 13.0* 08/04/2014   HGB 13.1 08/04/2014   HCT 39.4 08/04/2014   MCV 96.6 08/04/2014   PLT 306 08/04/2014   NEUTROABS 10.9* 08/04/2014     RADIOGRAPHIC STUDIES: I have personally reviewed the radiology reports and agreed with their findings. 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 Neoadjuvant chemotherapy with Taxotere, carboplatin, Herceptin, Perjeta every 3 weeks 6 cycles  followed by Herceptin maintenance every 3 weeks for 1 year 2. After 6 cycles of chemotherapy, breast surgery 3. Followed by radiation therapy 4. Followed by antiestrogen therapy with aromatase inhibitor  Current treatment: Cycle 2 day 1 TCHP  Chemo Monitoring: 1. ECHO 5/17: EF 55-60%  2.anxiety/insomnia: Uses Ativan as needed at bedtime.     3.  Constipation: Improved with  Stool softeners. Plans to begin MiraLax as well.   Recommend that she proceed with cycle 2 of TCH P.   RTC in 3 weeks prior to cycle 3. She was advised to call us if her headaches worsen or if she has any other problems prior to her next visit with [place].   No orders of the defined types were placed in this encounter.   The patient has a good understanding of the overall plan. she agrees with it. she will call with any problems that may develop before the next visit here.   Mikey Bussing, NP

## 2014-08-04 NOTE — Patient Instructions (Signed)
[facility] Discharge Instructions for Patients Receiving Chemotherapy  Today you received the following chemotherapy agents: Herceptin, Perjeta, Taxotere, Carboplatin  To help prevent nausea and vomiting after your treatment, we encourage you to take your nausea medication as prescribed by your physician.   If you develop nausea and vomiting that is not controlled by your nausea medication, call the clinic.   BELOW ARE SYMPTOMS THAT SHOULD BE REPORTED IMMEDIATELY:  *FEVER GREATER THAN 100.5 F  *CHILLS WITH OR WITHOUT FEVER  NAUSEA AND VOMITING THAT IS NOT CONTROLLED WITH YOUR NAUSEA MEDICATION  *UNUSUAL SHORTNESS OF BREATH  *UNUSUAL BRUISING OR BLEEDING  TENDERNESS IN MOUTH AND THROAT WITH OR WITHOUT PRESENCE OF ULCERS  *URINARY PROBLEMS  *BOWEL PROBLEMS  UNUSUAL RASH Items with * indicate a potential emergency and should be followed up as soon as possible.  Feel free to call the clinic you have any questions or concerns. The clinic phone number is [phone].  Please show the CHEMO ALERT CARD at check-in to the Emergency Department and triage nurse.   

## 2014-08-08 ENCOUNTER — Telehealth: Payer: Self-pay | Admitting: *Deleted

## 2014-08-08 ENCOUNTER — Other Ambulatory Visit: Payer: Self-pay | Admitting: *Deleted

## 2014-08-08 DIAGNOSIS — C50211 Malignant neoplasm of upper-inner quadrant of right female breast: Secondary | ICD-10-CM

## 2014-08-08 MED ORDER — LORAZEPAM 0.5 MG PO TABS: 0.5000 mg | ORAL_TABLET | Freq: Every day | ORAL | Status: DC

## 2014-08-08 NOTE — Telephone Encounter (Signed)
Received message stating patient needs a refill on her lorazepam.  This was called into her pharmacy and she is aware.

## 2014-08-18 ENCOUNTER — Other Ambulatory Visit: Payer: Self-pay

## 2014-08-26 ENCOUNTER — Encounter: Payer: Self-pay | Admitting: Hematology and Oncology

## 2014-08-26 ENCOUNTER — Ambulatory Visit (HOSPITAL_BASED_OUTPATIENT_CLINIC_OR_DEPARTMENT_OTHER): Payer: Medicare Other | Admitting: Hematology and Oncology

## 2014-08-26 ENCOUNTER — Encounter: Payer: Self-pay | Admitting: *Deleted

## 2014-08-26 ENCOUNTER — Encounter: Payer: Medicare Other | Admitting: Nutrition

## 2014-08-26 ENCOUNTER — Ambulatory Visit (HOSPITAL_BASED_OUTPATIENT_CLINIC_OR_DEPARTMENT_OTHER): Payer: Medicare Other

## 2014-08-26 ENCOUNTER — Other Ambulatory Visit (HOSPITAL_BASED_OUTPATIENT_CLINIC_OR_DEPARTMENT_OTHER): Payer: Medicare Other

## 2014-08-26 ENCOUNTER — Other Ambulatory Visit: Payer: Self-pay | Admitting: Hematology and Oncology

## 2014-08-26 ENCOUNTER — Telehealth: Payer: Self-pay | Admitting: Hematology and Oncology

## 2014-08-26 VITALS — BP 143/72 | HR 90 | Temp 98.7°F | Resp 18 | Ht 65.0 in | Wt 111.8 lb

## 2014-08-26 DIAGNOSIS — C50811 Malignant neoplasm of overlapping sites of right female breast: Secondary | ICD-10-CM

## 2014-08-26 DIAGNOSIS — K5900 Constipation, unspecified: Secondary | ICD-10-CM | POA: Diagnosis not present

## 2014-08-26 DIAGNOSIS — C50211 Malignant neoplasm of upper-inner quadrant of right female breast: Secondary | ICD-10-CM

## 2014-08-26 DIAGNOSIS — Z5112 Encounter for antineoplastic immunotherapy: Secondary | ICD-10-CM

## 2014-08-26 DIAGNOSIS — G4700 Insomnia, unspecified: Secondary | ICD-10-CM | POA: Diagnosis not present

## 2014-08-26 DIAGNOSIS — Z5111 Encounter for antineoplastic chemotherapy: Secondary | ICD-10-CM

## 2014-08-26 DIAGNOSIS — F411 Generalized anxiety disorder: Secondary | ICD-10-CM

## 2014-08-26 DIAGNOSIS — L658 Other specified nonscarring hair loss: Secondary | ICD-10-CM | POA: Diagnosis not present

## 2014-08-26 LAB — CBC WITH DIFFERENTIAL/PLATELET
BASO%: 0.3 % (ref 0.0–2.0)
BASOS ABS: 0 10*3/uL (ref 0.0–0.1)
EOS%: 0 % (ref 0.0–7.0)
Eosinophils Absolute: 0 10*3/uL (ref 0.0–0.5)
HEMATOCRIT: 37.8 % (ref 34.8–46.6)
HGB: 12.6 g/dL (ref 11.6–15.9)
LYMPH#: 0.9 10*3/uL (ref 0.9–3.3)
LYMPH%: 8.9 % — ABNORMAL LOW (ref 14.0–49.7)
MCH: 33.2 pg (ref 25.1–34.0)
MCHC: 33.4 g/dL (ref 31.5–36.0)
MCV: 99.1 fL (ref 79.5–101.0)
MONO#: 0.8 10*3/uL (ref 0.1–0.9)
MONO%: 8.2 % (ref 0.0–14.0)
NEUT#: 8.2 10*3/uL — ABNORMAL HIGH (ref 1.5–6.5)
NEUT%: 82.6 % — ABNORMAL HIGH (ref 38.4–76.8)
Platelets: 322 10*3/uL (ref 145–400)
RBC: 3.81 10*6/uL (ref 3.70–5.45)
RDW: 16.8 % — ABNORMAL HIGH (ref 11.2–14.5)
WBC: 9.9 10*3/uL (ref 3.9–10.3)

## 2014-08-26 LAB — COMPREHENSIVE METABOLIC PANEL (CC13)
ALK PHOS: 80 U/L (ref 40–150)
ALT: 14 U/L (ref 0–55)
AST: 17 U/L (ref 5–34)
Albumin: 3.7 g/dL (ref 3.5–5.0)
Anion Gap: 9 mEq/L (ref 3–11)
BILIRUBIN TOTAL: 0.31 mg/dL (ref 0.20–1.20)
BUN: 13.8 mg/dL (ref 7.0–26.0)
CO2: 26 mEq/L (ref 22–29)
Calcium: 9.9 mg/dL (ref 8.4–10.4)
Chloride: 108 mEq/L (ref 98–109)
Creatinine: 0.7 mg/dL (ref 0.6–1.1)
EGFR: 80 mL/min/{1.73_m2} — ABNORMAL LOW (ref >90)
Glucose: 119 mg/dl (ref 70–140)
Potassium: 4.9 mEq/L (ref 3.5–5.1)
Sodium: 142 mEq/L (ref 136–145)
Total Protein: 6.9 g/dL (ref 6.4–8.3)

## 2014-08-26 MED ORDER — ACETAMINOPHEN 325 MG PO TABS
ORAL_TABLET | ORAL | Status: AC
  Filled 2014-08-26: qty 2

## 2014-08-26 MED ORDER — ACETAMINOPHEN 325 MG PO TABS
650.0000 mg | ORAL_TABLET | Freq: Once | ORAL | Status: AC
  Administered 2014-08-26: 650 mg via ORAL

## 2014-08-26 MED ORDER — DIPHENHYDRAMINE HCL 25 MG PO CAPS
50.0000 mg | ORAL_CAPSULE | Freq: Once | ORAL | Status: AC
  Administered 2014-08-26: 50 mg via ORAL

## 2014-08-26 MED ORDER — TRASTUZUMAB CHEMO INJECTION 440 MG
6.0000 mg/kg | Freq: Once | INTRAVENOUS | Status: AC
  Administered 2014-08-26: 294 mg via INTRAVENOUS
  Filled 2014-08-26: qty 14

## 2014-08-26 MED ORDER — SODIUM CHLORIDE 0.9 % IV SOLN
Freq: Once | INTRAVENOUS | Status: DC
Start: 2014-08-26 — End: 2014-08-26

## 2014-08-26 MED ORDER — PALONOSETRON HCL INJECTION 0.25 MG/5ML
0.2500 mg | Freq: Once | INTRAVENOUS | Status: AC
  Administered 2014-08-26: 0.25 mg via INTRAVENOUS

## 2014-08-26 MED ORDER — SODIUM CHLORIDE 0.9 % IJ SOLN
10.0000 mL | INTRAMUSCULAR | Status: DC | PRN
  Filled 2014-08-26: qty 10

## 2014-08-26 MED ORDER — SODIUM CHLORIDE 0.9 % IV SOLN
Freq: Once | INTRAVENOUS | Status: AC
  Administered 2014-08-26: 12:00:00 via INTRAVENOUS

## 2014-08-26 MED ORDER — SODIUM CHLORIDE 0.9 % IJ SOLN
10.0000 mL | INTRAMUSCULAR | Status: DC | PRN
  Administered 2014-08-26: 10 mL
  Filled 2014-08-26: qty 10

## 2014-08-26 MED ORDER — PALONOSETRON HCL INJECTION 0.25 MG/5ML
INTRAVENOUS | Status: AC
  Filled 2014-08-26: qty 5

## 2014-08-26 MED ORDER — SODIUM CHLORIDE 0.9 % IV SOLN
420.0000 mg | Freq: Once | INTRAVENOUS | Status: AC
  Administered 2014-08-26: 420 mg via INTRAVENOUS
  Filled 2014-08-26: qty 14

## 2014-08-26 MED ORDER — HEPARIN SOD (PORK) LOCK FLUSH 100 UNIT/ML IV SOLN
500.0000 [IU] | Freq: Once | INTRAVENOUS | Status: DC | PRN
Start: 2014-08-26 — End: 2014-08-26
  Filled 2014-08-26: qty 5

## 2014-08-26 MED ORDER — DEXTROSE 5 % IV SOLN
65.0000 mg/m2 | Freq: Once | INTRAVENOUS | Status: AC
  Administered 2014-08-26: 100 mg via INTRAVENOUS
  Filled 2014-08-26: qty 10

## 2014-08-26 MED ORDER — PEGFILGRASTIM 6 MG/0.6ML ~~LOC~~ PSKT
6.0000 mg | PREFILLED_SYRINGE | Freq: Once | SUBCUTANEOUS | Status: AC
  Administered 2014-08-26: 6 mg via SUBCUTANEOUS
  Filled 2014-08-26: qty 0.6

## 2014-08-26 MED ORDER — HEPARIN SOD (PORK) LOCK FLUSH 100 UNIT/ML IV SOLN
500.0000 [IU] | Freq: Once | INTRAVENOUS | Status: AC | PRN
  Administered 2014-08-26: 500 [IU]
  Filled 2014-08-26: qty 5

## 2014-08-26 MED ORDER — SODIUM CHLORIDE 0.9 % IV SOLN
381.6000 mg | Freq: Once | INTRAVENOUS | Status: AC
  Administered 2014-08-26: 380 mg via INTRAVENOUS
  Filled 2014-08-26: qty 38

## 2014-08-26 MED ORDER — DIPHENHYDRAMINE HCL 25 MG PO CAPS
ORAL_CAPSULE | ORAL | Status: AC
  Filled 2014-08-26: qty 2

## 2014-08-26 MED ORDER — FOSAPREPITANT DIMEGLUMINE INJECTION 150 MG
Freq: Once | INTRAVENOUS | Status: AC
  Administered 2014-08-26: 12:00:00 via INTRAVENOUS
  Filled 2014-08-26: qty 5

## 2014-08-26 MED ORDER — ONDANSETRON HCL 8 MG PO TABS: 8.0000 mg | ORAL_TABLET | Freq: Two times a day (BID) | ORAL | Status: DC

## 2014-08-26 NOTE — Telephone Encounter (Signed)
Appointments made and as printed for patient °

## 2014-08-26 NOTE — Progress Notes (Signed)
Patient Care Team: [name], MD as PCP - General (Family Medicine)  DIAGNOSIS: No matching staging information was found for the patient.  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 She had tolerated first 2 cycles of chemotherapy fairly well. Did lose hair. She had constipation which has been resolved with stool softeners. Denies any nausea or vomiting. Denies any fevers or chills. She had a lot more nausea and abdominal discomfort related to gastritis than previously.  REVIEW OF SYSTEMS:   Constitutional: Denies fevers, chills or abnormal weight loss Eyes: Denies blurriness of vision Ears, nose, mouth, throat, and face: Denies mucositis or sore throat Respiratory: Denies cough, dyspnea or wheezes Cardiovascular: Denies palpitation, chest discomfort or lower extremity swelling Gastrointestinal:  Denies nausea, heartburn or change in bowel habits Skin: Denies abnormal skin rashes Lymphatics: Denies new lymphadenopathy or easy bruising Neurological:Denies numbness, tingling or new weaknesses Behavioral/Psych: Mood is stable, no new changes  All other systems were reviewed with the patient and are negative.  I have reviewed the past medical history, past surgical history, social history and family history with the patient and they are unchanged from previous note.  ALLERGIES:  has No Known  Allergies.  MEDICATIONS:  Current Outpatient Prescriptions  Medication Sig Dispense Refill  . dexamethasone (DECADRON) 4 MG tablet Take 1 tablet (4 mg total) by mouth 2 (two) times daily. Start the day before Taxotere. Then again the day after chemo for 3 days. 30 tablet 1  . docusate sodium (COLACE) 100 MG capsule Take 100 mg by mouth 3 (three) times daily as needed for mild constipation (constipation).    [name] HYDROcodone-acetaminophen (NORCO/VICODIN) 5-325 MG per tablet Take 1-2 tablets by mouth every 4 (four) hours as needed for moderate pain or severe pain. 25 tablet 0  . lidocaine-prilocaine (EMLA) cream Apply to affected area once 30 g 3  . LORazepam (ATIVAN) 0.5 MG tablet Take 1 tablet (0.5 mg total) by mouth at bedtime. 30 tablet 1  . mineral oil enema Place 133 mLs (1 enema total) rectally once. 266 mL 0  . ondansetron (ZOFRAN) 8 MG tablet Take 1 tablet (8 mg total) by mouth 2 (two) times daily. Start the day after chemo for 3 days. Then take as needed for nausea or vomiting. 30 tablet 1  . polyethylene glycol (MIRALAX / GLYCOLAX) packet Take 17 g by mouth 3 (three) times daily as needed for mild constipation (constipation).    . prochlorperazine (COMPAZINE) 10 MG tablet Take 1 tablet (10 mg total) by mouth every 6 (six) hours as needed (Nausea or vomiting). 30 tablet 1   No current facility-administered medications for this visit.    PHYSICAL EXAMINATION: ECOG PERFORMANCE STATUS: 1 - Symptomatic but completely ambulatory  Filed Vitals:   08/26/14 1032  BP: 143/72  Pulse: 90  Temp: 98.7 F (37.1 C)  Resp: 18   Filed Weights   08/26/14 1032  Weight: 111 lb 12.8  oz (50.712 kg)    GENERAL:alert, no distress and comfortable SKIN: skin color, texture, turgor are normal, no rashes or significant lesions EYES: normal, Conjunctiva are pink and non-injected, sclera clear OROPHARYNX:no exudate, no erythema and lips, buccal mucosa, and tongue normal  NECK: supple, thyroid normal  size, non-tender, without nodularity LYMPH:  no palpable lymphadenopathy in the cervical, axillary or inguinal LUNGS: clear to auscultation and percussion with normal breathing effort HEART: regular rate & rhythm and no murmurs and no lower extremity edema ABDOMEN:abdomen soft, non-tender and normal bowel sounds Musculoskeletal:no cyanosis of digits and no clubbing  NEURO: alert & oriented x 3 with fluent speech, no focal motor/sensory deficits  LABORATORY DATA:  I have reviewed the data as listed   Chemistry      Component Value Date/Time   NA 142 08/26/2014 1011   NA 140 07/18/2014 0151   K 4.9 08/26/2014 1011   K 3.6 07/18/2014 0151   CL 102 07/18/2014 0151   CO2 26 08/26/2014 1011   CO2 27 07/10/2014 1120   BUN 13.8 08/26/2014 1011   BUN 19 07/18/2014 0151   CREATININE 0.7 08/26/2014 1011   CREATININE 0.70 07/18/2014 0151      Component Value Date/Time   CALCIUM 9.9 08/26/2014 1011   CALCIUM 10.1 07/10/2014 1120   ALKPHOS 80 08/26/2014 1011   ALKPHOS 70 07/10/2014 1120   AST 17 08/26/2014 1011   AST 25 07/10/2014 1120   ALT 14 08/26/2014 1011   ALT 19 07/10/2014 1120   BILITOT 0.31 08/26/2014 1011   BILITOT 0.5 07/10/2014 1120       Lab Results  Component Value Date   WBC 9.9 08/26/2014   HGB 12.6 08/26/2014   HCT 37.8 08/26/2014   MCV 99.1 08/26/2014   PLT 322 08/26/2014   NEUTROABS 8.2* 08/26/2014   ASSESSMENT & PLAN:  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 Neoadjuvant chemotherapy with Taxotere, carboplatin, Herceptin, Perjeta every 3 weeks 6 cycles followed by Herceptin maintenance every 3 weeks for 1 year 2. After 6 cycles of chemotherapy, breast surgery 3.  Followed by radiation therapy 4. Followed by antiestrogen therapy with aromatase inhibitor  Current treatment: Cycle 3 day 1 TCHP  Chemo toxicities: 1. ECHO 5/17: EF 55-60% I reviewed her blood work and they're adequate for treatment. 2.anxiety/insomnia: Instructed her to take Ativan as needed at bedtime.  3. Alopecia 4. Constipation improved with stool softeners 5. Nausea due to chemotherapy could be related to gastritis from dexamethasone. I discontinue dexamethasone. I've added Emend to her regimen. I will also decrease the dosage of Taxotere.  Return to clinic in 3 weeks for cycle 4 of TCHP Monitoring for toxicities   No orders of the defined types were placed in this encounter.   The patient has a good understanding of the overall plan. she agrees with it. she will call with any problems that may develop before the next visit here.   Rulon Eisenmenger, MD

## 2014-08-26 NOTE — Assessment & Plan Note (Signed)
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 Neoadjuvant chemotherapy with Taxotere, carboplatin, Herceptin, Perjeta every 3 weeks 6 cycles followed by Herceptin maintenance every 3 weeks for 1 year 2. After 6 cycles of chemotherapy, breast surgery 3. Followed by radiation therapy 4. Followed by antiestrogen therapy with aromatase inhibitor  Current treatment: Cycle 3 day 1 TCHP  Chemo toxicities: 1. ECHO 5/17: EF 55-60% I reviewed her blood work and they're adequate for treatment. 2.anxiety/insomnia: Instructed her to take Ativan as needed at bedtime.  3. Alopecia 4. Constipation improved with stool softeners  Return to clinic in 3 weeks for cycle 4 of TCHP Monitoring for toxicities

## 2014-08-26 NOTE — Patient Instructions (Signed)
[facility] Discharge Instructions for Patients Receiving Chemotherapy  Today you received the following chemotherapy agents:herceptin, perjeta, taxotere, carboplatin  To help prevent nausea and vomiting after your treatment, we encourage you to take your nausea medication.  Take it as often as prescribed.     If you develop nausea and vomiting that is not controlled by your nausea medication, call the clinic. If it is after clinic hours your family physician or the after hours number for the clinic or go to the Emergency Department.   BELOW ARE SYMPTOMS THAT SHOULD BE REPORTED IMMEDIATELY:  *FEVER GREATER THAN 100.5 F  *CHILLS WITH OR WITHOUT FEVER  NAUSEA AND VOMITING THAT IS NOT CONTROLLED WITH YOUR NAUSEA MEDICATION  *UNUSUAL SHORTNESS OF BREATH  *UNUSUAL BRUISING OR BLEEDING  TENDERNESS IN MOUTH AND THROAT WITH OR WITHOUT PRESENCE OF ULCERS  *URINARY PROBLEMS  *BOWEL PROBLEMS  UNUSUAL RASH Items with * indicate a potential emergency and should be followed up as soon as possible.  Feel free to call the clinic you have any questions or concerns. The clinic phone number is [phone].   I have been informed and understand all the instructions given to me. I know to contact the clinic, my physician, or go to the Emergency Department if any problems should occur. I do not have any questions at this time, but understand that I may call the clinic during office hours   should I have any questions or need assistance in obtaining follow up care.    __________________________________________  _____________  __________ Signature of Patient or Authorized Representative            Date                   Time    __________________________________________ Nurse's Signature

## 2014-09-15 ENCOUNTER — Encounter: Payer: Self-pay | Admitting: Hematology and Oncology

## 2014-09-15 ENCOUNTER — Encounter: Payer: Self-pay | Admitting: *Deleted

## 2014-09-15 ENCOUNTER — Telehealth: Payer: Self-pay | Admitting: Hematology and Oncology

## 2014-09-15 ENCOUNTER — Ambulatory Visit (HOSPITAL_BASED_OUTPATIENT_CLINIC_OR_DEPARTMENT_OTHER): Payer: Medicare Other | Admitting: Hematology and Oncology

## 2014-09-15 ENCOUNTER — Ambulatory Visit (HOSPITAL_BASED_OUTPATIENT_CLINIC_OR_DEPARTMENT_OTHER): Payer: Medicare Other

## 2014-09-15 ENCOUNTER — Other Ambulatory Visit (HOSPITAL_BASED_OUTPATIENT_CLINIC_OR_DEPARTMENT_OTHER): Payer: Medicare Other

## 2014-09-15 VITALS — BP 127/69 | HR 96 | Temp 98.2°F | Resp 18 | Wt 110.2 lb

## 2014-09-15 DIAGNOSIS — C50211 Malignant neoplasm of upper-inner quadrant of right female breast: Secondary | ICD-10-CM

## 2014-09-15 DIAGNOSIS — L658 Other specified nonscarring hair loss: Secondary | ICD-10-CM

## 2014-09-15 DIAGNOSIS — G4700 Insomnia, unspecified: Secondary | ICD-10-CM | POA: Diagnosis not present

## 2014-09-15 DIAGNOSIS — R110 Nausea: Secondary | ICD-10-CM

## 2014-09-15 DIAGNOSIS — F411 Generalized anxiety disorder: Secondary | ICD-10-CM

## 2014-09-15 DIAGNOSIS — Z5112 Encounter for antineoplastic immunotherapy: Secondary | ICD-10-CM | POA: Diagnosis not present

## 2014-09-15 DIAGNOSIS — Z5111 Encounter for antineoplastic chemotherapy: Secondary | ICD-10-CM

## 2014-09-15 DIAGNOSIS — C50811 Malignant neoplasm of overlapping sites of right female breast: Secondary | ICD-10-CM

## 2014-09-15 LAB — COMPREHENSIVE METABOLIC PANEL (CC13)
ALT: 15 U/L (ref 0–55)
ANION GAP: 11 meq/L (ref 3–11)
AST: 20 U/L (ref 5–34)
Albumin: 3.7 g/dL (ref 3.5–5.0)
Alkaline Phosphatase: 84 U/L (ref 40–150)
BUN: 15.3 mg/dL (ref 7.0–26.0)
CO2: 26 mEq/L (ref 22–29)
Calcium: 9.8 mg/dL (ref 8.4–10.4)
Chloride: 107 mEq/L (ref 98–109)
Creatinine: 0.8 mg/dL (ref 0.6–1.1)
EGFR: 72 mL/min/{1.73_m2} — ABNORMAL LOW (ref >90)
GLUCOSE: 137 mg/dL (ref 70–140)
POTASSIUM: 4.9 meq/L (ref 3.5–5.1)
SODIUM: 143 meq/L (ref 136–145)
Total Bilirubin: 0.38 mg/dL (ref 0.20–1.20)
Total Protein: 6.9 g/dL (ref 6.4–8.3)

## 2014-09-15 LAB — CBC WITH DIFFERENTIAL/PLATELET
BASO%: 0.3 % (ref 0.0–2.0)
BASOS ABS: 0 10*3/uL (ref 0.0–0.1)
EOS%: 0 % (ref 0.0–7.0)
Eosinophils Absolute: 0 10*3/uL (ref 0.0–0.5)
HCT: 37.2 % (ref 34.8–46.6)
HGB: 12.4 g/dL (ref 11.6–15.9)
LYMPH#: 0.9 10*3/uL (ref 0.9–3.3)
LYMPH%: 11 % — AB (ref 14.0–49.7)
MCH: 33.5 pg (ref 25.1–34.0)
MCHC: 33.3 g/dL (ref 31.5–36.0)
MCV: 100.6 fL (ref 79.5–101.0)
MONO#: 0.7 10*3/uL (ref 0.1–0.9)
MONO%: 8.7 % (ref 0.0–14.0)
NEUT#: 6.7 10*3/uL — ABNORMAL HIGH (ref 1.5–6.5)
NEUT%: 80 % — ABNORMAL HIGH (ref 38.4–76.8)
Platelets: 285 10*3/uL (ref 145–400)
RBC: 3.7 10*6/uL (ref 3.70–5.45)
RDW: 19.9 % — AB (ref 11.2–14.5)
WBC: 8.4 10*3/uL (ref 3.9–10.3)

## 2014-09-15 MED ORDER — TRASTUZUMAB CHEMO INJECTION 440 MG
6.0000 mg/kg | Freq: Once | INTRAVENOUS | Status: AC
  Administered 2014-09-15: 294 mg via INTRAVENOUS
  Filled 2014-09-15: qty 14

## 2014-09-15 MED ORDER — CARBOPLATIN CHEMO INJECTION 450 MG/45ML
381.6000 mg | Freq: Once | INTRAVENOUS | Status: AC
  Administered 2014-09-15: 380 mg via INTRAVENOUS
  Filled 2014-09-15: qty 38

## 2014-09-15 MED ORDER — SODIUM CHLORIDE 0.9 % IV SOLN
Freq: Once | INTRAVENOUS | Status: AC
  Administered 2014-09-15: 11:00:00 via INTRAVENOUS

## 2014-09-15 MED ORDER — DIPHENHYDRAMINE HCL 25 MG PO CAPS
50.0000 mg | ORAL_CAPSULE | Freq: Once | ORAL | Status: AC
  Administered 2014-09-15: 50 mg via ORAL

## 2014-09-15 MED ORDER — HEPARIN SOD (PORK) LOCK FLUSH 100 UNIT/ML IV SOLN
500.0000 [IU] | Freq: Once | INTRAVENOUS | Status: DC | PRN
  Filled 2014-09-15: qty 5

## 2014-09-15 MED ORDER — SODIUM CHLORIDE 0.9 % IJ SOLN
10.0000 mL | INTRAMUSCULAR | Status: DC | PRN
  Filled 2014-09-15: qty 10

## 2014-09-15 MED ORDER — PEGFILGRASTIM 6 MG/0.6ML ~~LOC~~ PSKT
6.0000 mg | PREFILLED_SYRINGE | Freq: Once | SUBCUTANEOUS | Status: AC
  Administered 2014-09-15: 6 mg via SUBCUTANEOUS
  Filled 2014-09-15: qty 0.6

## 2014-09-15 MED ORDER — ACETAMINOPHEN 325 MG PO TABS
650.0000 mg | ORAL_TABLET | Freq: Once | ORAL | Status: AC
  Administered 2014-09-15: 650 mg via ORAL

## 2014-09-15 MED ORDER — PALONOSETRON HCL INJECTION 0.25 MG/5ML
0.2500 mg | Freq: Once | INTRAVENOUS | Status: AC
  Administered 2014-09-15: 0.25 mg via INTRAVENOUS

## 2014-09-15 MED ORDER — FOSAPREPITANT DIMEGLUMINE INJECTION 150 MG
Freq: Once | INTRAVENOUS | Status: AC
  Administered 2014-09-15: 11:00:00 via INTRAVENOUS
  Filled 2014-09-15: qty 5

## 2014-09-15 MED ORDER — SODIUM CHLORIDE 0.9 % IV SOLN
420.0000 mg | Freq: Once | INTRAVENOUS | Status: AC
  Administered 2014-09-15: 420 mg via INTRAVENOUS
  Filled 2014-09-15: qty 14

## 2014-09-15 MED ORDER — ACETAMINOPHEN 325 MG PO TABS
ORAL_TABLET | ORAL | Status: AC
  Filled 2014-09-15: qty 2

## 2014-09-15 MED ORDER — DOCETAXEL CHEMO INJECTION 160 MG/16ML
65.0000 mg/m2 | Freq: Once | INTRAVENOUS | Status: AC
  Administered 2014-09-15: 100 mg via INTRAVENOUS
  Filled 2014-09-15: qty 10

## 2014-09-15 MED ORDER — PALONOSETRON HCL INJECTION 0.25 MG/5ML
INTRAVENOUS | Status: AC
  Filled 2014-09-15: qty 5

## 2014-09-15 MED ORDER — DIPHENHYDRAMINE HCL 25 MG PO CAPS
ORAL_CAPSULE | ORAL | Status: AC
  Filled 2014-09-15: qty 2

## 2014-09-15 NOTE — Progress Notes (Signed)
Patient Care Team: [name], MD as PCP - General (Family Medicine)  DIAGNOSIS: No matching staging information was found for the patient.  SUMMARY OF ONCOLOGIC HISTORY:   Breast cancer of upper-inner quadrant of right female breast   06/20/2014 Initial Diagnosis right breast 3:00: Invasive ductal carcinoma with DCIS, grade 2,ER 90%, PR 1%,HER-2 positive ratio 2.97   06/30/2014 Breast MRI Right breast 2.4 x 2.2 x 2.2 cm irregular mass, no abnormal lymph nodes   07/14/2014 -  Neo-Adjuvant Chemotherapy TCH Perjeta every 3 weeks 6 followed by Herceptin maintenance    CHIEF COMPLIANT: Cycle 4 TCH Perjeta  INTERVAL HISTORY: Marilyn Dalton is a 74 year old with above-mentioned is right breast cancer currently on adjuvant chemotherapy with [place] Perjeta. She is tolerating chemotherapy extremely well. She did have mild nausea as well as occasional loose stools. Because of her prior history of constipation she is very happy with the loose stools. She has occasional leg cramps especially in the morning. She had mild nausea for 1-2 days after chemotherapy. Denies neuropathy or nail changes. She reports that she is able to eat certain foods or restaurants especially burgers.  REVIEW OF SYSTEMS:   Constitutional: Denies fevers, chills or abnormal weight loss Eyes: Denies blurriness of vision Ears, nose, mouth, throat, and face: Denies mucositis or sore throat Respiratory: Denies cough, dyspnea or wheezes Cardiovascular: Denies palpitation, chest discomfort or lower extremity swelling Gastrointestinal:  Denies nausea, heartburn or change in bowel habits Skin: Denies abnormal skin rashes Lymphatics: Denies new lymphadenopathy or easy bruising Neurological:Denies numbness, tingling or new weaknesses Behavioral/Psych: Mood is stable, no new changes   All other systems were reviewed with the patient and are negative.  I have reviewed the past medical history, past surgical history, social history  and family history with the patient and they are unchanged from previous note.  ALLERGIES:  has No Known Allergies.  MEDICATIONS:  Current Outpatient Prescriptions  Medication Sig Dispense Refill  . dexamethasone (DECADRON) 4 MG tablet Take 1 tablet (4 mg total) by mouth 2 (two) times daily. Start the day before Taxotere. Then again the day after chemo for 3 days. 30 tablet 1  . docusate sodium (COLACE) 100 MG capsule Take 100 mg by mouth 3 (three) times daily as needed for mild constipation (constipation).    [name] HYDROcodone-acetaminophen (NORCO/VICODIN) 5-325 MG per tablet Take 1-2 tablets by mouth every 4 (four) hours as needed for moderate pain or severe pain. 25 tablet 0  . lidocaine-prilocaine (EMLA) cream Apply to affected area once 30 g 3  . LORazepam (ATIVAN) 0.5 MG tablet Take 1 tablet (0.5 mg total) by mouth at bedtime. 30 tablet 1  . mineral oil enema Place 133 mLs (1 enema total) rectally once. 266 mL 0  . ondansetron (ZOFRAN) 8 MG tablet Take 1 tablet (8 mg total) by mouth 2 (two) times daily. Start the day after chemo for 3 days. Then take as needed for nausea or vomiting. 30 tablet 1  . polyethylene glycol (MIRALAX / GLYCOLAX) packet Take 17 g by mouth 3 (three) times daily as needed for mild constipation (constipation).    . prochlorperazine (COMPAZINE) 10 MG tablet Take 1 tablet (10 mg total) by mouth every 6 (six) hours as needed (Nausea or vomiting). 30 tablet 1   No current facility-administered medications for this visit.    PHYSICAL EXAMINATION: ECOG PERFORMANCE STATUS: 1 - Symptomatic but completely ambulatory  Filed Vitals:   09/15/14 0907  BP: 127/69  Pulse: 96  Temp: 98.2 F (36.8 C)  Resp: 18   Filed Weights   09/15/14 0907  Weight: 110 lb 3 oz (49.981 kg)    GENERAL:alert, no distress and comfortable SKIN: skin color, texture, turgor are normal, no rashes or significant lesions EYES: normal, Conjunctiva are pink and non-injected, sclera  clear OROPHARYNX:no exudate, no erythema and lips, buccal mucosa, and tongue normal  NECK: supple, thyroid normal size, non-tender, without nodularity LYMPH:  no palpable lymphadenopathy in the cervical, axillary or inguinal LUNGS: clear to auscultation and percussion with normal breathing effort HEART: regular rate & rhythm and no murmurs and no lower extremity edema ABDOMEN:abdomen soft, non-tender and normal bowel sounds Musculoskeletal:no cyanosis of digits and no clubbing  NEURO: alert & oriented x 3 with fluent speech, no focal motor/sensory deficits   LABORATORY DATA:  I have reviewed the data as listed   Chemistry      Component Value Date/Time   NA 143 09/15/2014 0851   NA 140 07/18/2014 0151   K 4.9 09/15/2014 0851   K 3.6 07/18/2014 0151   CL 102 07/18/2014 0151   CO2 26 09/15/2014 0851   CO2 27 07/10/2014 1120   BUN 15.3 09/15/2014 0851   BUN 19 07/18/2014 0151   CREATININE 0.8 09/15/2014 0851   CREATININE 0.70 07/18/2014 0151      Component Value Date/Time   CALCIUM 9.8 09/15/2014 0851   CALCIUM 10.1 07/10/2014 1120   ALKPHOS 84 09/15/2014 0851   ALKPHOS 70 07/10/2014 1120   AST 20 09/15/2014 0851   AST 25 07/10/2014 1120   ALT 15 09/15/2014 0851   ALT 19 07/10/2014 1120   BILITOT 0.38 09/15/2014 0851   BILITOT 0.5 07/10/2014 1120       Lab Results  Component Value Date   WBC 8.4 09/15/2014   HGB 12.4 09/15/2014   HCT 37.2 09/15/2014   MCV 100.6 09/15/2014   PLT 285 09/15/2014   NEUTROABS 6.7* 09/15/2014   ASSESSMENT & PLAN:  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 Neoadjuvant chemotherapy with Taxotere, carboplatin, Herceptin, Perjeta every 3 weeks 6  cycles followed by Herceptin maintenance every 3 weeks for 1 year 2. After 6 cycles of chemotherapy, breast surgery 3. Followed by radiation therapy 4. Followed by antiestrogen therapy with aromatase inhibitor  Current treatment: Cycle 4 day 1 TCHP  Chemo toxicities: 1. ECHO 5/17: EF 55-60% I reviewed her blood work and they're adequate for treatment. 2.anxiety/insomnia: Instructed her to take Ativan as needed at bedtime.  3. Alopecia 4. Constipation improved with stool softeners 5. Nausea due to chemotherapy could be related to gastritis from dexamethasone. I discontinued dexamethasone. I've added Emend to her regimen. I also decreased the dosage of Taxotere.  Return to clinic in 3 weeks for cycle 5 of TCHP with our NP and get MRI breast after cycle 6 Monitoring for toxicities   No orders of the defined types were placed in this encounter.   The patient has a good understanding of the overall plan. she agrees with it. she will call with any problems that may develop before the next visit here.   Rulon Eisenmenger, MD

## 2014-09-15 NOTE — Assessment & Plan Note (Signed)
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 Neoadjuvant chemotherapy with Taxotere, carboplatin, Herceptin, Perjeta every 3 weeks 6 cycles followed by Herceptin maintenance every 3 weeks for 1 year 2. After 6 cycles of chemotherapy, breast surgery 3. Followed by radiation therapy 4. Followed by antiestrogen therapy with aromatase inhibitor  Current treatment: Cycle 4 day 1 TCHP  Chemo toxicities: 1. ECHO 5/17: EF 55-60% I reviewed her blood work and they're adequate for treatment. 2.anxiety/insomnia: Instructed her to take Ativan as needed at bedtime.  3. Alopecia 4. Constipation improved with stool softeners 5. Nausea due to chemotherapy could be related to gastritis from dexamethasone. I discontinued dexamethasone. I've added Emend to her regimen. I also decreased the dosage of Taxotere.  Return to clinic in 3 weeks for cycle 5 of TCHP Monitoring for toxicities

## 2014-09-15 NOTE — Telephone Encounter (Signed)
Appointments made and avs printed for patient °

## 2014-10-06 ENCOUNTER — Encounter: Payer: Self-pay | Admitting: Nurse Practitioner

## 2014-10-06 ENCOUNTER — Ambulatory Visit (HOSPITAL_BASED_OUTPATIENT_CLINIC_OR_DEPARTMENT_OTHER): Payer: Medicare Other | Admitting: Nurse Practitioner

## 2014-10-06 ENCOUNTER — Ambulatory Visit (HOSPITAL_BASED_OUTPATIENT_CLINIC_OR_DEPARTMENT_OTHER): Payer: Medicare Other

## 2014-10-06 ENCOUNTER — Other Ambulatory Visit (HOSPITAL_BASED_OUTPATIENT_CLINIC_OR_DEPARTMENT_OTHER): Payer: Medicare Other

## 2014-10-06 VITALS — BP 131/72 | HR 82 | Temp 98.2°F | Resp 18 | Ht 65.0 in | Wt 112.3 lb

## 2014-10-06 DIAGNOSIS — I951 Orthostatic hypotension: Secondary | ICD-10-CM

## 2014-10-06 DIAGNOSIS — Z5112 Encounter for antineoplastic immunotherapy: Secondary | ICD-10-CM | POA: Diagnosis not present

## 2014-10-06 DIAGNOSIS — Z5111 Encounter for antineoplastic chemotherapy: Secondary | ICD-10-CM | POA: Diagnosis present

## 2014-10-06 DIAGNOSIS — C50811 Malignant neoplasm of overlapping sites of right female breast: Secondary | ICD-10-CM

## 2014-10-06 DIAGNOSIS — K5900 Constipation, unspecified: Secondary | ICD-10-CM

## 2014-10-06 DIAGNOSIS — R110 Nausea: Secondary | ICD-10-CM

## 2014-10-06 DIAGNOSIS — R530 Neoplastic (malignant) related fatigue: Secondary | ICD-10-CM | POA: Insufficient documentation

## 2014-10-06 DIAGNOSIS — C50211 Malignant neoplasm of upper-inner quadrant of right female breast: Secondary | ICD-10-CM

## 2014-10-06 LAB — COMPREHENSIVE METABOLIC PANEL (CC13)
ALBUMIN: 3.5 g/dL (ref 3.5–5.0)
ALK PHOS: 82 U/L (ref 40–150)
ALT: 14 U/L (ref 0–55)
AST: 18 U/L (ref 5–34)
Anion Gap: 5 mEq/L (ref 3–11)
BUN: 8.1 mg/dL (ref 7.0–26.0)
CO2: 26 mEq/L (ref 22–29)
Calcium: 9.1 mg/dL (ref 8.4–10.4)
Chloride: 111 mEq/L — ABNORMAL HIGH (ref 98–109)
Creatinine: 0.7 mg/dL (ref 0.6–1.1)
EGFR: 80 mL/min/{1.73_m2} — ABNORMAL LOW (ref >90)
GLUCOSE: 97 mg/dL (ref 70–140)
POTASSIUM: 5 meq/L (ref 3.5–5.1)
SODIUM: 142 meq/L (ref 136–145)
TOTAL PROTEIN: 6.4 g/dL (ref 6.4–8.3)
Total Bilirubin: 0.23 mg/dL (ref 0.20–1.20)

## 2014-10-06 LAB — CBC WITH DIFFERENTIAL/PLATELET
BASO%: 0.4 % (ref 0.0–2.0)
Basophils Absolute: 0 10*3/uL (ref 0.0–0.1)
EOS%: 0.5 % (ref 0.0–7.0)
Eosinophils Absolute: 0 10*3/uL (ref 0.0–0.5)
HCT: 34.9 % (ref 34.8–46.6)
HEMOGLOBIN: 11.7 g/dL (ref 11.6–15.9)
LYMPH%: 24.7 % (ref 14.0–49.7)
MCH: 34.3 pg — AB (ref 25.1–34.0)
MCHC: 33.4 g/dL (ref 31.5–36.0)
MCV: 102.7 fL — ABNORMAL HIGH (ref 79.5–101.0)
MONO#: 0.5 10*3/uL (ref 0.1–0.9)
MONO%: 10.3 % (ref 0.0–14.0)
NEUT%: 64.1 % (ref 38.4–76.8)
NEUTROS ABS: 3.1 10*3/uL (ref 1.5–6.5)
Platelets: 254 10*3/uL (ref 145–400)
RBC: 3.4 10*6/uL — ABNORMAL LOW (ref 3.70–5.45)
RDW: 19.8 % — AB (ref 11.2–14.5)
WBC: 4.9 10*3/uL (ref 3.9–10.3)
lymph#: 1.2 10*3/uL (ref 0.9–3.3)

## 2014-10-06 MED ORDER — SODIUM CHLORIDE 0.9 % IV SOLN
Freq: Once | INTRAVENOUS | Status: AC
  Administered 2014-10-06: 13:00:00 via INTRAVENOUS
  Filled 2014-10-06: qty 5

## 2014-10-06 MED ORDER — PALONOSETRON HCL INJECTION 0.25 MG/5ML
INTRAVENOUS | Status: AC
  Filled 2014-10-06: qty 5

## 2014-10-06 MED ORDER — HEPARIN SOD (PORK) LOCK FLUSH 100 UNIT/ML IV SOLN
500.0000 [IU] | Freq: Once | INTRAVENOUS | Status: AC | PRN
  Administered 2014-10-06: 500 [IU]
  Filled 2014-10-06: qty 5

## 2014-10-06 MED ORDER — ACETAMINOPHEN 325 MG PO TABS
650.0000 mg | ORAL_TABLET | Freq: Once | ORAL | Status: AC
  Administered 2014-10-06: 650 mg via ORAL

## 2014-10-06 MED ORDER — CARBOPLATIN CHEMO INJECTION 450 MG/45ML
381.6000 mg | Freq: Once | INTRAVENOUS | Status: AC
  Administered 2014-10-06: 380 mg via INTRAVENOUS
  Filled 2014-10-06: qty 38

## 2014-10-06 MED ORDER — SODIUM CHLORIDE 0.9 % IV SOLN
420.0000 mg | Freq: Once | INTRAVENOUS | Status: AC
  Administered 2014-10-06: 420 mg via INTRAVENOUS
  Filled 2014-10-06: qty 14

## 2014-10-06 MED ORDER — SODIUM CHLORIDE 0.9 % IJ SOLN
10.0000 mL | INTRAMUSCULAR | Status: DC | PRN
  Administered 2014-10-06: 10 mL
  Filled 2014-10-06: qty 10

## 2014-10-06 MED ORDER — DIPHENHYDRAMINE HCL 25 MG PO CAPS
50.0000 mg | ORAL_CAPSULE | Freq: Once | ORAL | Status: AC
Start: 2014-10-06 — End: 2014-10-06
  Administered 2014-10-06: 50 mg via ORAL

## 2014-10-06 MED ORDER — PEGFILGRASTIM 6 MG/0.6ML ~~LOC~~ PSKT
6.0000 mg | PREFILLED_SYRINGE | Freq: Once | SUBCUTANEOUS | Status: AC
  Administered 2014-10-06: 6 mg via SUBCUTANEOUS
  Filled 2014-10-06: qty 0.6

## 2014-10-06 MED ORDER — DEXTROSE 5 % IV SOLN
65.0000 mg/m2 | Freq: Once | INTRAVENOUS | Status: AC
  Administered 2014-10-06: 100 mg via INTRAVENOUS
  Filled 2014-10-06: qty 10

## 2014-10-06 MED ORDER — SODIUM CHLORIDE 0.9 % IV SOLN
Freq: Once | INTRAVENOUS | Status: AC
  Administered 2014-10-06: 11:00:00 via INTRAVENOUS

## 2014-10-06 MED ORDER — ACETAMINOPHEN 325 MG PO TABS
ORAL_TABLET | ORAL | Status: AC
  Filled 2014-10-06: qty 2

## 2014-10-06 MED ORDER — DIPHENHYDRAMINE HCL 25 MG PO CAPS
ORAL_CAPSULE | ORAL | Status: AC
  Filled 2014-10-06: qty 2

## 2014-10-06 MED ORDER — TRASTUZUMAB CHEMO INJECTION 440 MG
6.0000 mg/kg | Freq: Once | INTRAVENOUS | Status: AC
  Administered 2014-10-06: 294 mg via INTRAVENOUS
  Filled 2014-10-06: qty 14

## 2014-10-06 MED ORDER — PALONOSETRON HCL INJECTION 0.25 MG/5ML
0.2500 mg | Freq: Once | INTRAVENOUS | Status: AC
  Administered 2014-10-06: 0.25 mg via INTRAVENOUS

## 2014-10-06 NOTE — Progress Notes (Signed)
SYMPTOM MANAGEMENT CLINIC   HPI: Marilyn Dalton 74 y.o. female diagnosed with breast cancer.  Currently undergoing Adjuvant Taxotere/carboplatin/Herceptin/Perjeta chemotherapy regimen.  Patient presents to the [facility] today for cycle 5 of her Marilyn [facility] adjuvant chemotherapy regimen.  She reports increasing generalized fatigue, chronic nausea, and some occasional dizziness when she changes positions quickly.  She states that her chronic constipation is under control currently with use of stool softeners and the increased intake of fruit.  She denies any other new symptoms whatsoever.  She denies any recent fevers or chills.  Patient is also questioning if she could possibly have her Port-A-Cath removed after she finishes the chemotherapy portion of her regimen.  She states that she is very thin; and feels the Port-A-Cath in her chest when she is showering.   HPI  ROS  Past Medical History  Diagnosis Date  . Arthritis   . Breast cancer 06/20/14    right breast  . Allergy   . GERD (gastroesophageal reflux disease)   . Diverticulitis     Past Surgical History  Procedure Laterality Date  . Colon surgery  10/13/08    cecum polyp=adenomatous ,no high grade dysplasia or invasive malignancy  . Breast biopsy Left 10/6/210    no malignancy,extensive stromal fibrosis  . Breast biopsy Right 06/20/14    invasive ductal ca,dcis   . Hand surgery      RIGHT  . Knee arthroscopy      RT  . Tonsillectomy    . Appendectomy    . Portacath placement Left 07/11/2014    Procedure: INSERTION PORT-A-CATH;  Surgeon: [name], MD;  Location: [facility];  Service: General;  Laterality: Left;    has Breast cancer of upper-inner quadrant of right female breast; At risk for impaired cardiac function; Neoplastic malignant related fatigue; Nausea without vomiting; Constipation; and Orthostatic hypotension on her problem list.    has No Known Allergies.    Medication List       This list is  accurate as of: 10/06/14 11:02 AM.  Always use your most recent med list.               dexamethasone 4 MG tablet  Commonly known as:  DECADRON  Take 1 tablet (4 mg total) by mouth 2 (two) times daily. Start the day before Taxotere. Then again the day after chemo for 3 days.     docusate sodium 100 MG capsule  Commonly known as:  COLACE  Take 100 mg by mouth 3 (three) times daily as needed for mild constipation (constipation).     HYDROcodone-acetaminophen 5-325 MG per tablet  Commonly known as:  NORCO/VICODIN  Take 1-2 tablets by mouth every 4 (four) hours as needed for moderate pain or severe pain.     lidocaine-prilocaine cream  Commonly known as:  EMLA  Apply to affected area once     LORazepam 0.5 MG tablet  Commonly known as:  ATIVAN  Take 1 tablet (0.5 mg total) by mouth at bedtime.     mineral oil enema  Place 133 mLs (1 enema total) rectally once.     ondansetron 8 MG tablet  Commonly known as:  ZOFRAN  Take 1 tablet (8 mg total) by mouth 2 (two) times daily. Start the day after chemo for 3 days. Then take as needed for nausea or vomiting.     prochlorperazine 10 MG tablet  Commonly known as:  COMPAZINE  Take 1 tablet (10 mg total) by mouth every  6 (six) hours as needed (Nausea or vomiting).         PHYSICAL EXAMINATION  Oncology Vitals 10/06/2014 09/15/2014 08/26/2014 08/04/2014 07/22/2014 07/17/2014 07/15/2014  Height 165 cm - 165 cm 165 cm 165 cm - -  Weight 50.939 kg 49.981 kg 50.712 kg 49.215 kg 46.403 kg - -  Weight (lbs) 112 lbs 5 oz 110 lbs 3 oz 111 lbs 13 oz 108 lbs 8 oz 102 lbs 5 oz - -  BMI (kg/m2) 18.69 kg/m2 - 18.6 kg/m2 18.06 kg/m2 17.02 kg/m2 - -  Temp 98.2 98.2 98.7 98.1 98 98.3 98.1  Pulse 82 96 90 93 100 87 79  Resp 18 18 18 18 8 16 16   SpO2 100 97 98 100 100 97 100  BSA (m2) 1.53 m2 - 1.52 m2 1.5 m2 1.46 m2 - -   BP Readings from Last 3 Encounters:  10/06/14 131/72  09/15/14 127/69  08/26/14 143/72    Physical Exam  Constitutional: She is  oriented to person, place, and time.  Patient appears fatigued, frail, thin, and chronically ill.  HENT:  Head: Normocephalic and atraumatic.  Mouth/Throat: Oropharynx is clear and moist.  Eyes: Conjunctivae and EOM are normal. Pupils are equal, round, and reactive to light. Right eye exhibits no discharge. Left eye exhibits no discharge. No scleral icterus.  Neck: Normal range of motion. Neck supple. No JVD present. No tracheal deviation present. No thyromegaly present.  Cardiovascular: Normal rate, regular rhythm, normal heart sounds and intact distal pulses.   Pulmonary/Chest: Effort normal and breath sounds normal. No respiratory distress. She has no wheezes. She has no rales. She exhibits no tenderness.  Left upper chest Port-A-Cath intact with no evidence of infection.  Abdominal: Soft. Bowel sounds are normal. She exhibits no distension and no mass. There is no tenderness. There is no rebound and no guarding.  Musculoskeletal: Normal range of motion. She exhibits no edema or tenderness.  Lymphadenopathy:    She has no cervical adenopathy.  Neurological: She is alert and oriented to person, place, and time. Gait normal.  Skin: Skin is warm and dry. No rash noted. No erythema. No pallor.  Psychiatric: Affect normal.  Nursing note and vitals reviewed.   LABORATORY DATA:. Appointment on 10/06/2014  Component Date Value Ref Range Status  . WBC 10/06/2014 4.9  3.9 - 10.3 10e3/uL Final  . NEUT# 10/06/2014 3.1  1.5 - 6.5 10e3/uL Final  . HGB 10/06/2014 11.7  11.6 - 15.9 g/dL Final  . HCT 10/06/2014 34.9  34.8 - 46.6 % Final  . Platelets 10/06/2014 254  145 - 400 10e3/uL Final  . MCV 10/06/2014 102.7* 79.5 - 101.0 fL Final  . MCH 10/06/2014 34.3* 25.1 - 34.0 pg Final  . MCHC 10/06/2014 33.4  31.5 - 36.0 g/dL Final  . RBC 10/06/2014 3.40* 3.70 - 5.45 10e6/uL Final  . RDW 10/06/2014 19.8* 11.2 - 14.5 % Final  . lymph# 10/06/2014 1.2  0.9 - 3.3 10e3/uL Final  . MONO# 10/06/2014 0.5  0.1  - 0.9 10e3/uL Final  . Eosinophils Absolute 10/06/2014 0.0  0.0 - 0.5 10e3/uL Final  . Basophils Absolute 10/06/2014 0.0  0.0 - 0.1 10e3/uL Final  . NEUT% 10/06/2014 64.1  38.4 - 76.8 % Final  . LYMPH% 10/06/2014 24.7  14.0 - 49.7 % Final  . MONO% 10/06/2014 10.3  0.0 - 14.0 % Final  . EOS% 10/06/2014 0.5  0.0 - 7.0 % Final  . BASO% 10/06/2014 0.4  0.0 - 2.0 %  Final  . Sodium 10/06/2014 142  136 - 145 mEq/L Final  . Potassium 10/06/2014 5.0  3.5 - 5.1 mEq/L Final  . Chloride 10/06/2014 111* 98 - 109 mEq/L Final  . CO2 10/06/2014 26  22 - 29 mEq/L Final  . Glucose 10/06/2014 97  70 - 140 mg/dl Final  . BUN 10/06/2014 8.1  7.0 - 26.0 mg/dL Final  . Creatinine 10/06/2014 0.7  0.6 - 1.1 mg/dL Final  . Total Bilirubin 10/06/2014 0.23  0.20 - 1.20 mg/dL Final  . Alkaline Phosphatase 10/06/2014 82  40 - 150 U/L Final  . AST 10/06/2014 18  5 - 34 U/L Final  . ALT 10/06/2014 14  0 - 55 U/L Final  . Total Protein 10/06/2014 6.4  6.4 - 8.3 g/dL Final  . Albumin 10/06/2014 3.5  3.5 - 5.0 g/dL Final  . Calcium 10/06/2014 9.1  8.4 - 10.4 mg/dL Final  . Anion Gap 10/06/2014 5  3 - 11 mEq/L Final  . EGFR 10/06/2014 80* >90 ml/min/1.73 m2 Final   eGFR is calculated using the CKD-EPI Creatinine Equation (2009)     RADIOGRAPHIC STUDIES: No results found.  ASSESSMENT/PLAN:    Breast cancer of upper-inner quadrant of right female breast Patient presents to the [facility] today to receive cycle 5 of her neoadjuvant Taxotere/carboplatin/Herceptin/Perjeta chemotherapy regimen.  Patient continues to tolerate her chemotherapy regimen fairly well; with primary complaints of fatigue and intermittent, chronic nausea.  She denies any recent fevers or chills.  Blood counts obtained today were stable.  Patient is inquiring if it would be possible for patient to have the left upper chest Port-A-Cath removed after her last cycle of chemotherapy in approximately 3 weeks.  She states that the Port-A-Cath is a  constant reminder of her diagnosis; and it does occasionally bother her when she is showering.  Patient would prefer to receive the Herceptin on an every three-week basis via peripheral infusion if possible.  Patient has plans to return on 10/28/2014 for her last cycle of chemotherapy.  The plan is for the patient to continue to receive Herceptin on an every three-week basis for a total of one year.  Also request that schedulers schedule labs and a follow-up visit with Dr. Lindi Adie when patient returns on 10/28/2014.  Constipation Patient continues to complain of some chronic constipation; but states that the constipation is under control at the present time.  She continues to take stool softeners on a daily basis.  She also states that she has been eating plenty of fruit daily as well.  Nausea without vomiting Patient reports chronic nausea; but no vomiting.  She has been taking the Zofran with only moderate effectiveness.  She states the nausea has been lasting longer after each cycle of chemotherapy.  After review of patient's antinausea medications-patient confirmed that she has only been taking the Zofran.  Patient has Compazine at home; encouraged patient to try this for her nausea as well.  Neoplastic malignant related fatigue Patient is complaining of increased chronic chemotherapy-induced fatigue.  Patient was encouraged to remain as active as possible and to push fluids.  Orthostatic hypotension Patient is complaining of some occasional dizziness with sudden position changes.  She confirms that she does not take any blood pressure medications.  Whatsoever.  Blood pressure while cancer Center today was 131/72 with a heart rate of 82.  Advised patient that the dizziness with sudden position changes most likely orthostatic hypotension; and is secondary to mild dehydration.  Patient was encouraged  to push fluids at home; and to change positions slowly as well.  Patient stated  understanding of all instructions; and was in agreement with this plan of care. The patient knows to call the clinic with any problems, questions or concerns.   Review/collaboration with Dr. Lindi Adie regarding all aspects of patient's visit today.   Total time spent with patient was 25 minutes;  with greater than 75 percent of that time spent in face to face counseling regarding patient's symptoms,  and coordination of care and follow up.  Disclaimer: This note was dictated with voice recognition software. Similar sounding words can inadvertently be transcribed and may not be corrected upon review.   Drue Second, NP 10/06/2014

## 2014-10-06 NOTE — Assessment & Plan Note (Signed)
Patient continues to complain of some chronic constipation; but states that the constipation is under control at the present time.  She continues to take stool softeners on a daily basis.  She also states that she has been eating plenty of fruit daily as well.

## 2014-10-06 NOTE — Assessment & Plan Note (Signed)
Patient is complaining of increased chronic chemotherapy-induced fatigue.  Patient was encouraged to remain as active as possible and to push fluids.

## 2014-10-06 NOTE — Patient Instructions (Addendum)
[facility] Discharge Instructions for Patients Receiving Chemotherapy  Today you received the following chemotherapy agents Herceptin, Perjeta, Taxotere, Carboplatin  To help prevent nausea and vomiting after your treatment, we encourage you to take your nausea medication     If you develop nausea and vomiting that is not controlled by your nausea medication, call the clinic.   BELOW ARE SYMPTOMS THAT SHOULD BE REPORTED IMMEDIATELY:  *FEVER GREATER THAN 100.5 F  *CHILLS WITH OR WITHOUT FEVER  NAUSEA AND VOMITING THAT IS NOT CONTROLLED WITH YOUR NAUSEA MEDICATION  *UNUSUAL SHORTNESS OF BREATH  *UNUSUAL BRUISING OR BLEEDING  TENDERNESS IN MOUTH AND THROAT WITH OR WITHOUT PRESENCE OF ULCERS  *URINARY PROBLEMS  *BOWEL PROBLEMS   UNUSUAL RASH Items with * indicate a potential emergency and should be followed up as soon as possible.  Feel free to call the clinic you have any questions or concerns. The clinic phone number is [phone].  Please show the [facility] at check-in to the Emergency Department and triage nurse.

## 2014-10-06 NOTE — Assessment & Plan Note (Signed)
Patient reports chronic nausea; but no vomiting.  She has been taking the Zofran with only moderate effectiveness.  She states the nausea has been lasting longer after each cycle of chemotherapy.  After review of patient's antinausea medications-patient confirmed that she has only been taking the Zofran.  Patient has Compazine at home; encouraged patient to try this for her nausea as well.

## 2014-10-06 NOTE — Assessment & Plan Note (Signed)
Patient is complaining of some occasional dizziness with sudden position changes.  She confirms that she does not take any blood pressure medications.  Whatsoever.  Blood pressure while cancer Center today was 131/72 with a heart rate of 82.  Advised patient that the dizziness with sudden position changes most likely orthostatic hypotension; and is secondary to mild dehydration.  Patient was encouraged to push fluids at home; and to change positions slowly as well.

## 2014-10-06 NOTE — Assessment & Plan Note (Addendum)
Patient presents to the [facility] today to receive cycle 5 of her neoadjuvant Taxotere/carboplatin/Herceptin/Perjeta chemotherapy regimen.  Patient continues to tolerate her chemotherapy regimen fairly well; with primary complaints of fatigue and intermittent, chronic nausea.  She denies any recent fevers or chills.  Blood counts obtained today were stable.  Patient is inquiring if it would be possible for patient to have the left upper chest Port-A-Cath removed after her last cycle of chemotherapy in approximately 3 weeks.  She states that the Port-A-Cath is a constant reminder of her diagnosis; and it does occasionally bother her when she is showering.  Patient would prefer to receive the Herceptin on an every three-week basis via peripheral infusion if possible.  Patient has plans to return on 10/28/2014 for her last cycle of chemotherapy.  The plan is for the patient to continue to receive Herceptin on an every three-week basis for a total of one year.  Also request that schedulers schedule labs and a follow-up visit with Dr. Lindi Adie when patient returns on 10/28/2014.

## 2014-10-14 ENCOUNTER — Ambulatory Visit (HOSPITAL_BASED_OUTPATIENT_CLINIC_OR_DEPARTMENT_OTHER): Payer: Medicare Other | Admitting: Nurse Practitioner

## 2014-10-14 ENCOUNTER — Other Ambulatory Visit: Payer: Self-pay | Admitting: *Deleted

## 2014-10-14 ENCOUNTER — Ambulatory Visit (HOSPITAL_BASED_OUTPATIENT_CLINIC_OR_DEPARTMENT_OTHER): Payer: Medicare Other

## 2014-10-14 ENCOUNTER — Encounter: Payer: Self-pay | Admitting: Nurse Practitioner

## 2014-10-14 VITALS — BP 121/73 | HR 98 | Temp 98.1°F | Resp 18 | Ht 65.0 in | Wt 104.2 lb

## 2014-10-14 DIAGNOSIS — E860 Dehydration: Secondary | ICD-10-CM | POA: Diagnosis present

## 2014-10-14 DIAGNOSIS — C50211 Malignant neoplasm of upper-inner quadrant of right female breast: Secondary | ICD-10-CM

## 2014-10-14 DIAGNOSIS — R197 Diarrhea, unspecified: Secondary | ICD-10-CM

## 2014-10-14 DIAGNOSIS — C50811 Malignant neoplasm of overlapping sites of right female breast: Secondary | ICD-10-CM

## 2014-10-14 DIAGNOSIS — R110 Nausea: Secondary | ICD-10-CM | POA: Diagnosis not present

## 2014-10-14 DIAGNOSIS — Z95828 Presence of other vascular implants and grafts: Secondary | ICD-10-CM

## 2014-10-14 LAB — CBC WITH DIFFERENTIAL/PLATELET
BASO%: 0.4 % (ref 0.0–2.0)
BASOS ABS: 0.1 10*3/uL (ref 0.0–0.1)
EOS ABS: 0 10*3/uL (ref 0.0–0.5)
EOS%: 0.1 % (ref 0.0–7.0)
HCT: 41.6 % (ref 34.8–46.6)
HEMOGLOBIN: 14.1 g/dL (ref 11.6–15.9)
LYMPH#: 4.2 10*3/uL — AB (ref 0.9–3.3)
LYMPH%: 15.8 % (ref 14.0–49.7)
MCH: 34.7 pg — AB (ref 25.1–34.0)
MCHC: 33.9 g/dL (ref 31.5–36.0)
MCV: 102.5 fL — AB (ref 79.5–101.0)
MONO#: 3.6 10*3/uL — ABNORMAL HIGH (ref 0.1–0.9)
MONO%: 13.3 % (ref 0.0–14.0)
NEUT#: 18.8 10*3/uL — ABNORMAL HIGH (ref 1.5–6.5)
NEUT%: 70.4 % (ref 38.4–76.8)
NRBC: 1 % — AB (ref 0–0)
PLATELETS: 371 10*3/uL (ref 145–400)
RBC: 4.06 10*6/uL (ref 3.70–5.45)
RDW: 18.6 % — ABNORMAL HIGH (ref 11.2–14.5)
WBC: 26.7 10*3/uL — ABNORMAL HIGH (ref 3.9–10.3)

## 2014-10-14 LAB — COMPREHENSIVE METABOLIC PANEL (CC13)
ALBUMIN: 4 g/dL (ref 3.5–5.0)
ALK PHOS: 145 U/L (ref 40–150)
ALT: 16 U/L (ref 0–55)
ANION GAP: 12 meq/L — AB (ref 3–11)
AST: 19 U/L (ref 5–34)
BUN: 9.8 mg/dL (ref 7.0–26.0)
CALCIUM: 10 mg/dL (ref 8.4–10.4)
CO2: 24 mEq/L (ref 22–29)
Chloride: 107 mEq/L (ref 98–109)
Creatinine: 0.9 mg/dL (ref 0.6–1.1)
EGFR: 61 mL/min/{1.73_m2} — AB (ref >90)
GLUCOSE: 128 mg/dL (ref 70–140)
POTASSIUM: 4.5 meq/L (ref 3.5–5.1)
SODIUM: 142 meq/L (ref 136–145)
TOTAL PROTEIN: 7.1 g/dL (ref 6.4–8.3)

## 2014-10-14 MED ORDER — HEPARIN SOD (PORK) LOCK FLUSH 100 UNIT/ML IV SOLN
500.0000 [IU] | Freq: Once | INTRAVENOUS | Status: AC
  Administered 2014-10-14: 500 [IU] via INTRAVENOUS
  Filled 2014-10-14: qty 5

## 2014-10-14 MED ORDER — SODIUM CHLORIDE 0.9 % IV SOLN
Freq: Once | INTRAVENOUS | Status: AC
  Administered 2014-10-14: 12:00:00 via INTRAVENOUS
  Filled 2014-10-14: qty 4

## 2014-10-14 MED ORDER — SODIUM CHLORIDE 0.9 % IV SOLN
INTRAVENOUS | Status: DC
  Administered 2014-10-14: 12:00:00 via INTRAVENOUS

## 2014-10-14 MED ORDER — SODIUM CHLORIDE 0.9 % IJ SOLN
10.0000 mL | INTRAMUSCULAR | Status: DC | PRN
  Administered 2014-10-14 (×2): 10 mL via INTRAVENOUS
  Filled 2014-10-14: qty 10

## 2014-10-14 NOTE — Assessment & Plan Note (Signed)
Patient received her last cycle of carboplatin/Taxotere/Herceptin/Perjeta chemotherapy on 10/06/2014.  She also received a Neulasta injection following her last cycle of chemotherapy.  Blood counts stable with WBC 26.7, ANC 18.8, hemoglobin 14.1, platelet count 371.  Patient reports chronic diarrhea; and has also been vomiting for the past 3 days.  Patient states that she has tried the Zofran at home with minimal effectiveness.  Patient is scheduled to return on 10/28/2014 for labs, visit, and her next cycle of chemotherapy.

## 2014-10-14 NOTE — Assessment & Plan Note (Signed)
Patient received her last cycle of carboplatin/Taxotere/Herceptin/Perjeta chemotherapy on 10/06/2014.  She also received a Neulasta injection following her last cycle of chemotherapy.  Blood counts stable with WBC 26.7, ANC 18.8, hemoglobin 14.1, platelet count 371.  Patient reports chronic diarrhea; and has also been vomiting for the past 3 days.  Patient states that she has tried the Zofran at home with minimal effectiveness.  Patient states that she also has both Compazine and Ativan at home; but doesn't like the side effects of either these medications.  She feels fairly dehydrated today.  Patient will receive IV fluid rehydration today.  She was encouraged to push fluids at home as well.  Also, advised patient that she very well may need to return to the [facility] several times per week for additional IV rehydration support.

## 2014-10-14 NOTE — Patient Instructions (Signed)
Dehydration, Adult Dehydration is when you lose more fluids from the body than you take in. Vital organs like the kidneys, brain, and heart cannot function without a proper amount of fluids and salt. Any loss of fluids from the body can cause dehydration.  CAUSES   Vomiting.  Diarrhea.  Excessive sweating.  Excessive urine output.  Fever. SYMPTOMS  Mild dehydration  Thirst.  Dry lips.  Slightly dry mouth. Moderate dehydration  Very dry mouth.  Sunken eyes.  Skin does not bounce back quickly when lightly pinched and released.  Dark urine and decreased urine production.  Decreased tear production.  Headache. Severe dehydration  Very dry mouth.  Extreme thirst.  Rapid, weak pulse (more than 100 beats per minute at rest).  Cold hands and feet.  Not able to sweat in spite of heat and temperature.  Rapid breathing.  Blue lips.  Confusion and lethargy.  Difficulty being awakened.  Minimal urine production.  No tears. DIAGNOSIS  Your caregiver will diagnose dehydration based on your symptoms and your exam. Blood and urine tests will help confirm the diagnosis. The diagnostic evaluation should also identify the cause of dehydration. TREATMENT  Treatment of mild or moderate dehydration can often be done at home by increasing the amount of fluids that you drink. It is best to drink small amounts of fluid more often. Drinking too much at one time can make vomiting worse. Refer to the home care instructions below. Severe dehydration needs to be treated at the hospital where you will probably be given intravenous (IV) fluids that contain water and electrolytes. HOME CARE INSTRUCTIONS   Ask your caregiver about specific rehydration instructions.  Drink enough fluids to keep your urine clear or pale yellow.  Drink small amounts frequently if you have nausea and vomiting.  Eat as you normally do.  Avoid:  Foods or drinks high in sugar.  Carbonated  drinks.  Juice.  Extremely hot or cold fluids.  Drinks with caffeine.  Fatty, greasy foods.  Alcohol.  Tobacco.  Overeating.  Gelatin desserts.  Wash your hands well to avoid spreading bacteria and viruses.  Only take over-the-counter or prescription medicines for pain, discomfort, or fever as directed by your caregiver.  Ask your caregiver if you should continue all prescribed and over-the-counter medicines.  Keep all follow-up appointments with your caregiver. SEEK MEDICAL CARE IF:  You have abdominal pain and it increases or stays in one area (localizes).  You have a rash, stiff neck, or severe headache.  You are irritable, sleepy, or difficult to awaken.  You are weak, dizzy, or extremely thirsty. SEEK IMMEDIATE MEDICAL CARE IF:   You are unable to keep fluids down or you get worse despite treatment.  You have frequent episodes of vomiting or diarrhea.  You have blood or green matter (bile) in your vomit.  You have blood in your stool or your stool looks black and tarry.  You have not urinated in 6 to 8 hours, or you have only urinated a small amount of very dark urine.  You have a fever.  You faint. MAKE SURE YOU:   Understand these instructions.  Will watch your condition.  Will get help right away if you are not doing well or get worse. Document Released: 02/07/2005 Document Revised: 05/02/2011 Document Reviewed: 09/27/2010 Exit[employer] Patient Information 2015 [employer]. This information is not intended to replace advice given to you by your health care provider. Make sure you discuss any questions you have with your health care   provider.  

## 2014-10-14 NOTE — Assessment & Plan Note (Signed)
Patient states that she has been experiencing chronic nausea and multiple episodes of vomiting for the past 2-3 days.  She states she has taken Zofran at home with only minimal effectiveness.  Patient states she has both Compazine and Ativan home; but does not want to take it to him the side effects.  Patient appears dehydrated today.  Patient will receive IV fluid rehydration today.  She'll also be given Zofran IV while at the cancer center.  Patient was encouraged to push fluids at home is much as possible.  She was also encouraged to return to the [facility] if she needs additional IV fluid rehydration.  Also, may want to consider given Aloxi 0.25 mg IV 1 in the future if patient continues with chronic nausea and vomiting.

## 2014-10-14 NOTE — Assessment & Plan Note (Signed)
Patient has a long, chronic history of constipation.  Patient states that she was constipated earlier this week; and took mag citrate, Colace, and multiple enemas.  Following this,-.  Patient developed some chronic diarrhea.  Patient states that she avoids taking either Imodium or Lomotil to to worry of constipation.  Patient does appear dehydrated today.  Patient received IV fluid rehydration today while the cancer Center.  She was also encouraged to push fluids at home is much as possible.  Also, long discussion with both patient and his wife regarding the need to consider occasional Imodium in hopes of controlling the diarrhea.

## 2014-10-14 NOTE — Progress Notes (Signed)
Received call from patient stating she is not feeling well. She had some constipation last week and was not relieved with magnesium citrate.  Patient stated she had an enema and finally had results.  She is now vomiting and having diarrhea.  She states she is not able to eat or drink much. Everything that goes in comes out.  She will get labs and see Retta Mac today at 10:30 and 11am.  I also have her set up for IVF's after her visit.  Patient aware.

## 2014-10-14 NOTE — Progress Notes (Signed)
[facility]   HPI: Marilyn Dalton 74 y.o. female diagnosed with breast cancer.  Currently undergoing carboplatin/Taxotere/Herceptin/Perjeta chemotherapy regimen.  Patient received her last cycle of carboplatin/Taxotere/Herceptin/Perjeta chemotherapy on 10/06/2014.  She also received a Neulasta injection following her last cycle of chemotherapy.    Patient reports chronic diarrhea; and has also been vomiting for the past 3 days.  Patient states that she has tried the Zofran at home with minimal effectiveness.  Patient denies any recent fevers or chills.  HPI  ROS  Past Medical History  Diagnosis Date  . Arthritis   . Breast cancer 06/20/14    right breast  . Allergy   . GERD (gastroesophageal reflux disease)   . Diverticulitis     Past Surgical History  Procedure Laterality Date  . Colon surgery  10/13/08    cecum polyp=adenomatous ,no high grade dysplasia or invasive malignancy  . Breast biopsy Left 10/6/210    no malignancy,extensive stromal fibrosis  . Breast biopsy Right 06/20/14    invasive ductal ca,dcis   . Hand surgery      RIGHT  . Knee arthroscopy      RT  . Tonsillectomy    . Appendectomy    . Portacath placement Left 07/11/2014    Procedure: INSERTION PORT-A-CATH;  Surgeon: [name], MD;  Location: [facility];  Service: General;  Laterality: Left;    has Breast cancer of upper-inner quadrant of right female breast; At risk for impaired cardiac function; Neoplastic malignant related fatigue; Nausea without vomiting; Constipation; Orthostatic hypotension; Diarrhea; and Dehydration on her problem list.    has No Known Allergies.    Medication List       This list is accurate as of: 10/14/14 12:52 PM.  Always use your most recent med list.               dexamethasone 4 MG tablet  Commonly known as:  DECADRON  Take 1 tablet (4 mg total) by mouth 2 (two) times daily. Start the day before Taxotere. Then again the day after chemo for  3 days.     docusate sodium 100 MG capsule  Commonly known as:  COLACE  Take 100 mg by mouth 3 (three) times daily as needed for mild constipation (constipation).     HYDROcodone-acetaminophen 5-325 MG per tablet  Commonly known as:  NORCO/VICODIN  Take 1-2 tablets by mouth every 4 (four) hours as needed for moderate pain or severe pain.     lidocaine-prilocaine cream  Commonly known as:  EMLA  Apply to affected area once     LORazepam 0.5 MG tablet  Commonly known as:  ATIVAN  Take 1 tablet (0.5 mg total) by mouth at bedtime.     mineral oil enema  Place 133 mLs (1 enema total) rectally once.     ondansetron 8 MG tablet  Commonly known as:  ZOFRAN  Take 1 tablet (8 mg total) by mouth 2 (two) times daily. Start the day after chemo for 3 days. Then take as needed for nausea or vomiting.     prochlorperazine 10 MG tablet  Commonly known as:  COMPAZINE  Take 1 tablet (10 mg total) by mouth every 6 (six) hours as needed (Nausea or vomiting).         PHYSICAL EXAMINATION  Oncology Vitals 10/14/2014 10/06/2014 09/15/2014 08/26/2014 08/04/2014 07/22/2014 07/17/2014  Height 165 cm 165 cm - 165 cm 165 cm 165 cm -  Weight 47.265 kg 50.939 kg 49.981 kg  50.712 kg 49.215 kg 46.403 kg -  Weight (lbs) 104 lbs 3 oz 112 lbs 5 oz 110 lbs 3 oz 111 lbs 13 oz 108 lbs 8 oz 102 lbs 5 oz -  BMI (kg/m2) 17.34 kg/m2 18.69 kg/m2 - 18.6 kg/m2 18.06 kg/m2 17.02 kg/m2 -  Temp 98.1 98.2 98.2 98.7 98.1 98 98.3  Pulse 98 82 96 90 93 100 87  Resp 18 18 18 18 18 8 16   SpO2 99 100 97 98 100 100 97  BSA (m2) 1.47 m2 1.53 m2 - 1.52 m2 1.5 m2 1.46 m2 -   BP Readings from Last 3 Encounters:  10/14/14 121/73  10/06/14 131/72  09/15/14 127/69    Physical Exam  Constitutional: She is oriented to person, place, and time. Vital signs are normal. She appears malnourished and dehydrated. She appears unhealthy. She appears cachectic.  HENT:  Head: Normocephalic and atraumatic.  Eyes: Conjunctivae and EOM are normal.  Pupils are equal, round, and reactive to light. Right eye exhibits no discharge. Left eye exhibits no discharge. No scleral icterus.  Neck: Normal range of motion.  Pulmonary/Chest: Effort normal. No respiratory distress.  Musculoskeletal: Normal range of motion.  Neurological: She is alert and oriented to person, place, and time. Gait normal.  Skin: Skin is warm and dry. There is pallor.  Psychiatric: Affect normal.  Occasionally tearful.  Nursing note and vitals reviewed.   LABORATORY DATA:. Appointment on 10/14/2014  Component Date Value Ref Range Status  . WBC 10/14/2014 26.7* 3.9 - 10.3 10e3/uL Final  . NEUT# 10/14/2014 18.8* 1.5 - 6.5 10e3/uL Final  . HGB 10/14/2014 14.1  11.6 - 15.9 g/dL Final  . HCT 10/14/2014 41.6  34.8 - 46.6 % Final  . Platelets 10/14/2014 371  145 - 400 10e3/uL Final  . MCV 10/14/2014 102.5* 79.5 - 101.0 fL Final  . MCH 10/14/2014 34.7* 25.1 - 34.0 pg Final  . MCHC 10/14/2014 33.9  31.5 - 36.0 g/dL Final  . RBC 10/14/2014 4.06  3.70 - 5.45 10e6/uL Final  . RDW 10/14/2014 18.6* 11.2 - 14.5 % Final  . lymph# 10/14/2014 4.2* 0.9 - 3.3 10e3/uL Final  . MONO# 10/14/2014 3.6* 0.1 - 0.9 10e3/uL Final  . Eosinophils Absolute 10/14/2014 0.0  0.0 - 0.5 10e3/uL Final  . Basophils Absolute 10/14/2014 0.1  0.0 - 0.1 10e3/uL Final  . NEUT% 10/14/2014 70.4  38.4 - 76.8 % Final  . LYMPH% 10/14/2014 15.8  14.0 - 49.7 % Final  . MONO% 10/14/2014 13.3  0.0 - 14.0 % Final  . EOS% 10/14/2014 0.1  0.0 - 7.0 % Final  . BASO% 10/14/2014 0.4  0.0 - 2.0 % Final  . nRBC 10/14/2014 1* 0 - 0 % Final  . Sodium 10/14/2014 142  136 - 145 mEq/L Final  . Potassium 10/14/2014 4.5  3.5 - 5.1 mEq/L Final  . Chloride 10/14/2014 107  98 - 109 mEq/L Final  . CO2 10/14/2014 24  22 - 29 mEq/L Final  . Glucose 10/14/2014 128  70 - 140 mg/dl Final  . BUN 10/14/2014 9.8  7.0 - 26.0 mg/dL Final  . Creatinine 10/14/2014 0.9  0.6 - 1.1 mg/dL Final  . Total Bilirubin 10/14/2014 <0.20  0.20 -  1.20 mg/dL Final  . Alkaline Phosphatase 10/14/2014 145  40 - 150 U/L Final  . AST 10/14/2014 19  5 - 34 U/L Final  . ALT 10/14/2014 16  0 - 55 U/L Final  . Total Protein 10/14/2014 7.1  6.4 -  8.3 g/dL Final  . Albumin 10/14/2014 4.0  3.5 - 5.0 g/dL Final  . Calcium 10/14/2014 10.0  8.4 - 10.4 mg/dL Final  . Anion Gap 10/14/2014 12* 3 - 11 mEq/L Final  . EGFR 10/14/2014 61* >90 ml/min/1.73 m2 Final   eGFR is calculated using the CKD-EPI Creatinine Equation (2009)     RADIOGRAPHIC STUDIES: No results found.  ASSESSMENT/PLAN:    Breast cancer of upper-inner quadrant of right female breast Patient received her last cycle of carboplatin/Taxotere/Herceptin/Perjeta chemotherapy on 10/06/2014.  She also received a Neulasta injection following her last cycle of chemotherapy.  Blood counts stable with WBC 26.7, ANC 18.8, hemoglobin 14.1, platelet count 371.  Patient reports chronic diarrhea; and has also been vomiting for the past 3 days.  Patient states that she has tried the Zofran at home with minimal effectiveness.  Patient is scheduled to return on 10/28/2014 for labs, visit, and her next cycle of chemotherapy.  Dehydration Patient received her last cycle of carboplatin/Taxotere/Herceptin/Perjeta chemotherapy on 10/06/2014.  She also received a Neulasta injection following her last cycle of chemotherapy.  Blood counts stable with WBC 26.7, ANC 18.8, hemoglobin 14.1, platelet count 371.  Patient reports chronic diarrhea; and has also been vomiting for the past 3 days.  Patient states that she has tried the Zofran at home with minimal effectiveness.  Patient states that she also has both Compazine and Ativan at home; but doesn't like the side effects of either these medications.  She feels fairly dehydrated today.  Patient will receive IV fluid rehydration today.  She was encouraged to push fluids at home as well.  Also, advised patient that she very well may need to return to the [facility] several times per week for additional IV rehydration support.    Diarrhea Patient has a long, chronic history of constipation.  Patient states that she was constipated earlier this week; and took mag citrate, Colace, and multiple enemas.  Following this,-.  Patient developed some chronic diarrhea.  Patient states that she avoids taking either Imodium or Lomotil to to worry of constipation.  Patient does appear dehydrated today.  Patient received IV fluid rehydration today while the cancer Center.  She was also encouraged to push fluids at home is much as possible.  Also, long discussion with both patient and his wife regarding the need to consider occasional Imodium in hopes of controlling the diarrhea.  Nausea without vomiting Patient states that she has been experiencing chronic nausea and multiple episodes of vomiting for the past 2-3 days.  She states she has taken Zofran at home with only minimal effectiveness.  Patient states she has both Compazine and Ativan home; but does not want to take it to him the side effects.  Patient appears dehydrated today.  Patient will receive IV fluid rehydration today.  She'll also be given Zofran IV while at the cancer center.  Patient was encouraged to push fluids at home is much as possible.  She was also encouraged to return to the [facility] if she needs additional IV fluid rehydration.  Also, may want to consider given Aloxi 0.25 mg IV 1 in the future if patient continues with chronic nausea and vomiting.  Patient stated understanding of all instructions; and was in agreement with this plan of care. The patient knows to call the clinic with any problems, questions or concerns.   Review/collaboration with Dr. Lindi Adie regarding all aspects of patient's visit today.   Total time spent with patient was  25 minutes;  with greater than 75 percent of that time spent in face to face counseling regarding patient's symptoms,  and coordination of  care and follow up.  Disclaimer: This note was dictated with voice recognition software. Similar sounding words can inadvertently be transcribed and may not be corrected upon review.   Drue Second, NP 10/14/2014

## 2014-10-16 ENCOUNTER — Telehealth: Payer: Self-pay | Admitting: *Deleted

## 2014-10-16 NOTE — Telephone Encounter (Signed)
Spoke to pt to follow up from [facility] visit.  Pt reports the compazine has improved her nausea and vomiting. Pt is very anxious about her constipation/Diarrhea. Pt reports she has been eating "a ton of Watermelon" advised pt to hold eating so much watermelon. Pt states she has an easier time vomiting watermelon verses plain water. Suggested to pt that since her nausea and vomiting has resolved she should not eat so much watermelon and increase her water. Pt agrees. Pt advised to contact this office if she has any further questions or concerns.

## 2014-10-22 ENCOUNTER — Encounter: Payer: Self-pay | Admitting: Skilled Nursing Facility1

## 2014-10-22 NOTE — Progress Notes (Signed)
Subjective:     Patient ID: Marilyn Dalton, female   DOB: Mar 25, 1940, 74 y.o.   MRN: [id number]  HPI   Review of Systems     Objective:   Physical Exam To assist the pt in identifying dietary strategies to gain some lost wt back.    Assessment:     Pt identified as being malnourished due to losing some wt. Pt contacted via the telephone at [phone]. Pt states she was not taking her nausea medicine because she thought it was for schizophrenics. Pt states she is now taking that medicine which has caused the nausea to subside and her appetite to increase. Pt states she is now exercising again and is gaining her wt back.     Plan:     No plan identified at this time.

## 2014-10-24 ENCOUNTER — Other Ambulatory Visit: Payer: Self-pay | Admitting: *Deleted

## 2014-10-24 ENCOUNTER — Telehealth: Payer: Self-pay | Admitting: Hematology and Oncology

## 2014-10-24 DIAGNOSIS — C50211 Malignant neoplasm of upper-inner quadrant of right female breast: Secondary | ICD-10-CM

## 2014-10-24 NOTE — Telephone Encounter (Signed)
Appointment made for echo and md [facility],dr gudena appointment 9/6 and patient will get an avs at that time   [facility]

## 2014-10-28 ENCOUNTER — Ambulatory Visit (HOSPITAL_BASED_OUTPATIENT_CLINIC_OR_DEPARTMENT_OTHER): Payer: Medicare Other | Admitting: Hematology and Oncology

## 2014-10-28 ENCOUNTER — Ambulatory Visit (HOSPITAL_BASED_OUTPATIENT_CLINIC_OR_DEPARTMENT_OTHER): Payer: Medicare Other

## 2014-10-28 ENCOUNTER — Telehealth: Payer: Self-pay | Admitting: Hematology and Oncology

## 2014-10-28 ENCOUNTER — Encounter: Payer: Self-pay | Admitting: Hematology and Oncology

## 2014-10-28 ENCOUNTER — Encounter: Payer: Self-pay | Admitting: *Deleted

## 2014-10-28 VITALS — BP 130/64 | HR 98 | Temp 98.2°F | Resp 18 | Ht 65.0 in | Wt 109.1 lb

## 2014-10-28 DIAGNOSIS — C50811 Malignant neoplasm of overlapping sites of right female breast: Secondary | ICD-10-CM

## 2014-10-28 DIAGNOSIS — L658 Other specified nonscarring hair loss: Secondary | ICD-10-CM

## 2014-10-28 DIAGNOSIS — R197 Diarrhea, unspecified: Secondary | ICD-10-CM | POA: Diagnosis not present

## 2014-10-28 DIAGNOSIS — C50211 Malignant neoplasm of upper-inner quadrant of right female breast: Secondary | ICD-10-CM

## 2014-10-28 DIAGNOSIS — F411 Generalized anxiety disorder: Secondary | ICD-10-CM

## 2014-10-28 DIAGNOSIS — Z5111 Encounter for antineoplastic chemotherapy: Secondary | ICD-10-CM

## 2014-10-28 DIAGNOSIS — R110 Nausea: Secondary | ICD-10-CM | POA: Diagnosis not present

## 2014-10-28 DIAGNOSIS — K5900 Constipation, unspecified: Secondary | ICD-10-CM

## 2014-10-28 DIAGNOSIS — Z5112 Encounter for antineoplastic immunotherapy: Secondary | ICD-10-CM

## 2014-10-28 DIAGNOSIS — G4700 Insomnia, unspecified: Secondary | ICD-10-CM

## 2014-10-28 LAB — COMPREHENSIVE METABOLIC PANEL (CC13)
ALBUMIN: 3.9 g/dL (ref 3.5–5.0)
ALT: 13 U/L (ref 0–55)
ANION GAP: 9 meq/L (ref 3–11)
AST: 18 U/L (ref 5–34)
Alkaline Phosphatase: 69 U/L (ref 40–150)
BILIRUBIN TOTAL: 0.32 mg/dL (ref 0.20–1.20)
BUN: 9.3 mg/dL (ref 7.0–26.0)
CO2: 25 meq/L (ref 22–29)
CREATININE: 0.8 mg/dL (ref 0.6–1.1)
Calcium: 9.9 mg/dL (ref 8.4–10.4)
Chloride: 109 mEq/L (ref 98–109)
EGFR: 76 mL/min/{1.73_m2} — ABNORMAL LOW (ref >90)
GLUCOSE: 161 mg/dL — AB (ref 70–140)
Potassium: 4.9 mEq/L (ref 3.5–5.1)
SODIUM: 143 meq/L (ref 136–145)
TOTAL PROTEIN: 6.9 g/dL (ref 6.4–8.3)

## 2014-10-28 LAB — CBC WITH DIFFERENTIAL/PLATELET
BASO%: 0.1 % (ref 0.0–2.0)
Basophils Absolute: 0 10*3/uL (ref 0.0–0.1)
EOS%: 0 % (ref 0.0–7.0)
Eosinophils Absolute: 0 10*3/uL (ref 0.0–0.5)
HCT: 37.3 % (ref 34.8–46.6)
HGB: 12.4 g/dL (ref 11.6–15.9)
LYMPH%: 8 % — AB (ref 14.0–49.7)
MCH: 34.8 pg — ABNORMAL HIGH (ref 25.1–34.0)
MCHC: 33.2 g/dL (ref 31.5–36.0)
MCV: 104.8 fL — ABNORMAL HIGH (ref 79.5–101.0)
MONO#: 0.4 10*3/uL (ref 0.1–0.9)
MONO%: 6.1 % (ref 0.0–14.0)
NEUT%: 85.8 % — AB (ref 38.4–76.8)
NEUTROS ABS: 6 10*3/uL (ref 1.5–6.5)
PLATELETS: 208 10*3/uL (ref 145–400)
RBC: 3.56 10*6/uL — AB (ref 3.70–5.45)
RDW: 20.7 % — ABNORMAL HIGH (ref 11.2–14.5)
WBC: 7 10*3/uL (ref 3.9–10.3)
lymph#: 0.6 10*3/uL — ABNORMAL LOW (ref 0.9–3.3)

## 2014-10-28 MED ORDER — ACETAMINOPHEN 325 MG PO TABS
ORAL_TABLET | ORAL | Status: AC
  Filled 2014-10-28: qty 2

## 2014-10-28 MED ORDER — SODIUM CHLORIDE 0.9 % IV SOLN
420.0000 mg | Freq: Once | INTRAVENOUS | Status: AC
  Administered 2014-10-28: 420 mg via INTRAVENOUS
  Filled 2014-10-28: qty 14

## 2014-10-28 MED ORDER — TRASTUZUMAB CHEMO INJECTION 440 MG
6.0000 mg/kg | Freq: Once | INTRAVENOUS | Status: AC
  Administered 2014-10-28: 294 mg via INTRAVENOUS
  Filled 2014-10-28: qty 14

## 2014-10-28 MED ORDER — SODIUM CHLORIDE 0.9 % IV SOLN
Freq: Once | INTRAVENOUS | Status: AC
  Administered 2014-10-28: 11:00:00 via INTRAVENOUS
  Filled 2014-10-28: qty 5

## 2014-10-28 MED ORDER — DIPHENHYDRAMINE HCL 25 MG PO CAPS
50.0000 mg | ORAL_CAPSULE | Freq: Once | ORAL | Status: AC
  Administered 2014-10-28: 50 mg via ORAL

## 2014-10-28 MED ORDER — HEPARIN SOD (PORK) LOCK FLUSH 100 UNIT/ML IV SOLN
500.0000 [IU] | Freq: Once | INTRAVENOUS | Status: AC | PRN
  Administered 2014-10-28: 500 [IU]
  Filled 2014-10-28: qty 5

## 2014-10-28 MED ORDER — DIPHENHYDRAMINE HCL 25 MG PO CAPS
ORAL_CAPSULE | ORAL | Status: AC
  Filled 2014-10-28: qty 2

## 2014-10-28 MED ORDER — ACETAMINOPHEN 325 MG PO TABS
650.0000 mg | ORAL_TABLET | Freq: Once | ORAL | Status: AC
  Administered 2014-10-28: 650 mg via ORAL

## 2014-10-28 MED ORDER — PALONOSETRON HCL INJECTION 0.25 MG/5ML
0.2500 mg | Freq: Once | INTRAVENOUS | Status: AC
  Administered 2014-10-28: 0.25 mg via INTRAVENOUS

## 2014-10-28 MED ORDER — SODIUM CHLORIDE 0.9 % IV SOLN
381.6000 mg | Freq: Once | INTRAVENOUS | Status: AC
  Administered 2014-10-28: 380 mg via INTRAVENOUS
  Filled 2014-10-28: qty 38

## 2014-10-28 MED ORDER — PEGFILGRASTIM 6 MG/0.6ML ~~LOC~~ PSKT
6.0000 mg | PREFILLED_SYRINGE | Freq: Once | SUBCUTANEOUS | Status: AC
  Administered 2014-10-28: 6 mg via SUBCUTANEOUS
  Filled 2014-10-28: qty 0.6

## 2014-10-28 MED ORDER — DOCETAXEL CHEMO INJECTION 160 MG/16ML
65.0000 mg/m2 | Freq: Once | INTRAVENOUS | Status: AC
  Administered 2014-10-28: 100 mg via INTRAVENOUS
  Filled 2014-10-28: qty 10

## 2014-10-28 MED ORDER — PALONOSETRON HCL INJECTION 0.25 MG/5ML
INTRAVENOUS | Status: AC
  Filled 2014-10-28: qty 5

## 2014-10-28 MED ORDER — SODIUM CHLORIDE 0.9 % IV SOLN
Freq: Once | INTRAVENOUS | Status: AC
  Administered 2014-10-28: 09:00:00 via INTRAVENOUS

## 2014-10-28 MED ORDER — SODIUM CHLORIDE 0.9 % IJ SOLN
10.0000 mL | INTRAMUSCULAR | Status: DC | PRN
  Administered 2014-10-28: 10 mL
  Filled 2014-10-28: qty 10

## 2014-10-28 NOTE — Assessment & Plan Note (Signed)
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 Neoadjuvant chemotherapy with Taxotere, carboplatin, Herceptin, Perjeta every 3 weeks 6 cycles followed by Herceptin maintenance every 3 weeks for 1 year 2. After 6 cycles of chemotherapy, breast surgery 3. Followed by radiation therapy 4. Followed by antiestrogen therapy with aromatase inhibitor  Current treatment: Cycle 6 day 1 TCHP  Chemo toxicities: 1. ECHO 5/17: EF 55-60% I reviewed her blood work and they're adequate for treatment. 2.anxiety/insomnia: Instructed her to take Ativan as needed at bedtime.  3. Alopecia 4. Constipation improved with stool softeners 5. Nausea due to chemotherapy: decreased the dosage of Taxotere, added emend. 6. Diarrhea due to Perjeta versus due to stool softeners and enemas: Given IV fluids 10/14/2014  Plan: 1. Schedule patient for breast MRI and tumor board presentation 2. Followed by surgery appointment.  Return to clinic after a breast MRI to discuss the final outcome.

## 2014-10-28 NOTE — Progress Notes (Signed)
Patient Care Team: [name], MD as PCP - General (Family Medicine)  DIAGNOSIS: No matching staging information was found for the patient.  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 She completed with today's treatment 6 cycles of chemotherapy. She does feel fatigued. She has been not feeling that great with the past several days. Denies any fevers or chills. Denies any cough expectoration.  REVIEW OF SYSTEMS:   Constitutional: Denies fevers, chills or abnormal weight loss Eyes: Denies blurriness of vision Ears, nose, mouth, throat, and face: Denies mucositis or sore throat Respiratory: Denies cough, dyspnea or wheezes Cardiovascular: Denies palpitation, chest discomfort or lower extremity swelling Gastrointestinal:  Denies nausea, heartburn or change in bowel habits Skin: Denies abnormal skin rashes Lymphatics: Denies new lymphadenopathy or easy bruising Neurological:Denies numbness, tingling or new weaknesses Behavioral/Psych: Mood is stable, no new changes  Breast:  denies any pain or lumps or nodules in either breasts All other systems were reviewed with the patient and are negative.  I have reviewed the past medical history, past surgical history, social history and family history with the patient and they are unchanged from previous note.  ALLERGIES:  has No Known  Allergies.  MEDICATIONS:  Current Outpatient Prescriptions  Medication Sig Dispense Refill  . dexamethasone (DECADRON) 4 MG tablet Take 1 tablet (4 mg total) by mouth 2 (two) times daily. Start the day before Taxotere. Then again the day after chemo for 3 days. 30 tablet 1  . docusate sodium (COLACE) 100 MG capsule Take 100 mg by mouth 3 (three) times daily as needed for mild constipation (constipation).    [name] HYDROcodone-acetaminophen (NORCO/VICODIN) 5-325 MG per tablet Take 1-2 tablets by mouth every 4 (four) hours as needed for moderate pain or severe pain. 25 tablet 0  . lidocaine-prilocaine (EMLA) cream Apply to affected area once 30 g 3  . LORazepam (ATIVAN) 0.5 MG tablet Take 1 tablet (0.5 mg total) by mouth at bedtime. 30 tablet 1  . mineral oil enema Place 133 mLs (1 enema total) rectally once. 266 mL 0  . ondansetron (ZOFRAN) 8 MG tablet Take 1 tablet (8 mg total) by mouth 2 (two) times daily. Start the day after chemo for 3 days. Then take as needed for nausea or vomiting. 30 tablet 1  . prochlorperazine (COMPAZINE) 10 MG tablet Take 1 tablet (10 mg total) by mouth every 6 (six) hours as needed (Nausea or vomiting). 30 tablet 1   No current facility-administered medications for this visit.   Facility-Administered Medications Ordered in Other Visits  Medication Dose Route Frequency Provider Last Rate Last Dose  . sodium chloride 0.9 % injection 10 mL  10 mL Intracatheter PRN Nicholas Lose, MD   10 mL at 10/28/14 1348    PHYSICAL EXAMINATION: ECOG PERFORMANCE STATUS: 0 - Asymptomatic  Filed Vitals:   10/28/14 0823  BP: 130/64  Pulse: 98  Temp: 98.2 F (36.8 C)  Resp: 18  Filed Weights   10/28/14 0823  Weight: 109 lb 1.6 oz (49.487 kg)    GENERAL:alert, no distress and comfortable SKIN: skin color, texture, turgor are normal, no rashes or significant lesions EYES: normal, Conjunctiva are pink and non-injected, sclera clear OROPHARYNX:no exudate, no erythema and lips,  buccal mucosa, and tongue normal  NECK: supple, thyroid normal size, non-tender, without nodularity LYMPH:  no palpable lymphadenopathy in the cervical, axillary or inguinal LUNGS: clear to auscultation and percussion with normal breathing effort HEART: regular rate & rhythm and no murmurs and no lower extremity edema ABDOMEN:abdomen soft, non-tender and normal bowel sounds Musculoskeletal:no cyanosis of digits and no clubbing  NEURO: alert & oriented x 3 with fluent speech, no focal motor/sensory deficits  LABORATORY DATA:  I have reviewed the data as listed   Chemistry      Component Value Date/Time   NA 143 10/28/2014 0758   NA 140 07/18/2014 0151   K 4.9 10/28/2014 0758   K 3.6 07/18/2014 0151   CL 102 07/18/2014 0151   CO2 25 10/28/2014 0758   CO2 27 07/10/2014 1120   BUN 9.3 10/28/2014 0758   BUN 19 07/18/2014 0151   CREATININE 0.8 10/28/2014 0758   CREATININE 0.70 07/18/2014 0151      Component Value Date/Time   CALCIUM 9.9 10/28/2014 0758   CALCIUM 10.1 07/10/2014 1120   ALKPHOS 69 10/28/2014 0758   ALKPHOS 70 07/10/2014 1120   AST 18 10/28/2014 0758   AST 25 07/10/2014 1120   ALT 13 10/28/2014 0758   ALT 19 07/10/2014 1120   BILITOT 0.32 10/28/2014 0758   BILITOT 0.5 07/10/2014 1120       Lab Results  Component Value Date   WBC 7.0 10/28/2014   HGB 12.4 10/28/2014   HCT 37.3 10/28/2014   MCV 104.8* 10/28/2014   PLT 208 10/28/2014   NEUTROABS 6.0 10/28/2014   ASSESSMENT & PLAN:  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 Neoadjuvant chemotherapy with Taxotere, carboplatin, Herceptin, Perjeta every 3 weeks 6 cycles followed by Herceptin maintenance every 3 weeks for 1  year 2. After 6 cycles of chemotherapy, breast surgery 3. Followed by radiation therapy 4. Followed by antiestrogen therapy with aromatase inhibitor  Current treatment: Cycle 6 day 1 TCHP  Chemo toxicities: 1. ECHO 5/17: EF 55-60% I reviewed her blood work and they're adequate for treatment. 2.anxiety/insomnia: Instructed her to take Ativan as needed at bedtime.  3. Alopecia 4. Constipation improved with stool softeners 5. Nausea due to chemotherapy: decreased the dosage of Taxotere, added emend. 6. Diarrhea due to Perjeta versus due to stool softeners and enemas: Given IV fluids 10/14/2014  Plan: 1. Schedule patient for breast MRI and tumor board presentation 2. Followed by surgery appointment.  Return to clinic after a breast MRI to discuss the final outcome.      Orders Placed This Encounter  Procedures  . MR Breast Bilateral W Wo Contrast    Standing Status: Future     Number of Occurrences:      Standing Expiration Date: 12/28/2015    Order Specific Question:  Reason for Exam (SYMPTOM  OR DIAGNOSIS REQUIRED)    Answer:  Post neoadjuvant chemo  for breast cancer    Order Specific Question:  Preferred imaging location?    Answer:  [id number]    Order Specific Question:  Does the patient have a pacemaker or implanted devices?    Answer:  No    Order Specific Question:  What is the patient's sedation requirement?    Answer:  No Sedation  . ECHOCARDIOGRAM COMPLETE    Standing Status: Future     Number of Occurrences:      Standing Expiration Date: 01/28/2016    Scheduling Instructions:     attn bensinhom/mclean.  Call [phone]    Order Specific Question:  Where should this test be performed    Answer:  [facility]    Order Specific Question:  Complete or Limited study?    Answer:  Complete    Order Specific Question:  With Image Enhancing Agent or without Image Enhancing Agent?    Answer:  With Image Enhancing Agent    Order Specific Question:  Reason for  exam-Echo    Answer:  Chemo  V67.2 / Z09   The patient has a good understanding of the overall plan. she agrees with it. she will call with any problems that may develop before the next visit here.   Rulon Eisenmenger, MD

## 2014-10-28 NOTE — Patient Instructions (Signed)
[facility] Discharge Instructions for Patients Receiving Chemotherapy  Today you received the following chemotherapy agents Herceptin/Perjeta To help prevent nausea and vomiting after your treatment, we encourage you to take your nausea medication as prescribed.   If you develop nausea and vomiting that is not controlled by your nausea medication, call the clinic.   BELOW ARE SYMPTOMS THAT SHOULD BE REPORTED IMMEDIATELY:  *FEVER GREATER THAN 100.5 F  *CHILLS WITH OR WITHOUT FEVER  NAUSEA AND VOMITING THAT IS NOT CONTROLLED WITH YOUR NAUSEA MEDICATION  *UNUSUAL SHORTNESS OF BREATH  *UNUSUAL BRUISING OR BLEEDING  TENDERNESS IN MOUTH AND THROAT WITH OR WITHOUT PRESENCE OF ULCERS  *URINARY PROBLEMS  *BOWEL PROBLEMS  UNUSUAL RASH Items with * indicate a potential emergency and should be followed up as soon as possible.  Feel free to call the clinic you have any questions or concerns. The clinic phone number is [phone].  Please show the CHEMO ALERT CARD at check-in to the Emergency Department and triage nurse.  

## 2014-10-28 NOTE — Telephone Encounter (Signed)
appointments adjusted and patient will get a new avs in chemo

## 2014-10-28 NOTE — Telephone Encounter (Signed)
Appointments made and avs printed for patient,echo to [facility] for precert and patient will call for mri

## 2014-10-29 ENCOUNTER — Other Ambulatory Visit: Payer: Self-pay

## 2014-10-30 ENCOUNTER — Telehealth: Payer: Self-pay | Admitting: *Deleted

## 2014-10-30 NOTE — Telephone Encounter (Signed)
Spoke with patient and confirmed new appointment for Dr. Lindi Adie for 9/19 at 830am for labs and 9am with Dr. Lindi Adie.  Also informed her of her appointment with Dr. [name] for 9/19 at 230pm. Encouraged her to call with any questions or concerns.

## 2014-11-02 ENCOUNTER — Other Ambulatory Visit: Payer: Self-pay

## 2014-11-04 ENCOUNTER — Other Ambulatory Visit: Payer: Self-pay

## 2014-11-04 DIAGNOSIS — C50211 Malignant neoplasm of upper-inner quadrant of right female breast: Secondary | ICD-10-CM

## 2014-11-04 MED ORDER — LORAZEPAM 0.5 MG PO TABS: 0.5000 mg | ORAL_TABLET | Freq: Every day | ORAL | Status: DC

## 2014-11-05 ENCOUNTER — Ambulatory Visit: Payer: Medicare Other

## 2014-11-05 ENCOUNTER — Other Ambulatory Visit: Payer: Medicare Other

## 2014-11-05 ENCOUNTER — Ambulatory Visit: Payer: Medicare Other | Admitting: Hematology and Oncology

## 2014-11-06 ENCOUNTER — Ambulatory Visit: Payer: Medicare Other | Admitting: Hematology and Oncology

## 2014-11-06 ENCOUNTER — Ambulatory Visit
Admission: RE | Admit: 2014-11-06 | Discharge: 2014-11-06 | Disposition: A | Discharge status: Home / Self Care | Payer: Medicare Other | Source: Ambulatory Visit | Attending: Hematology and Oncology | Admitting: Hematology and Oncology

## 2014-11-06 ENCOUNTER — Other Ambulatory Visit: Payer: Medicare Other

## 2014-11-06 DIAGNOSIS — C50211 Malignant neoplasm of upper-inner quadrant of right female breast: Secondary | ICD-10-CM

## 2014-11-06 MED ORDER — GADOBENATE DIMEGLUMINE 529 MG/ML IV SOLN
10.0000 mL | Freq: Once | INTRAVENOUS | Status: AC | PRN
  Administered 2014-11-06: 10 mL via INTRAVENOUS

## 2014-11-10 ENCOUNTER — Ambulatory Visit (HOSPITAL_BASED_OUTPATIENT_CLINIC_OR_DEPARTMENT_OTHER): Payer: Medicare Other | Admitting: Hematology and Oncology

## 2014-11-10 ENCOUNTER — Encounter: Payer: Self-pay | Admitting: Hematology and Oncology

## 2014-11-10 ENCOUNTER — Other Ambulatory Visit (HOSPITAL_BASED_OUTPATIENT_CLINIC_OR_DEPARTMENT_OTHER): Payer: Medicare Other

## 2014-11-10 ENCOUNTER — Telehealth: Payer: Self-pay | Admitting: Hematology and Oncology

## 2014-11-10 ENCOUNTER — Other Ambulatory Visit: Payer: Self-pay | Admitting: General Surgery

## 2014-11-10 VITALS — BP 141/72 | HR 86 | Temp 98.3°F | Resp 18 | Ht 65.0 in | Wt 110.6 lb

## 2014-11-10 DIAGNOSIS — R197 Diarrhea, unspecified: Secondary | ICD-10-CM | POA: Diagnosis not present

## 2014-11-10 DIAGNOSIS — C50811 Malignant neoplasm of overlapping sites of right female breast: Secondary | ICD-10-CM

## 2014-11-10 DIAGNOSIS — E860 Dehydration: Secondary | ICD-10-CM

## 2014-11-10 DIAGNOSIS — C50911 Malignant neoplasm of unspecified site of right female breast: Secondary | ICD-10-CM

## 2014-11-10 DIAGNOSIS — C50211 Malignant neoplasm of upper-inner quadrant of right female breast: Secondary | ICD-10-CM

## 2014-11-10 DIAGNOSIS — R110 Nausea: Secondary | ICD-10-CM | POA: Diagnosis not present

## 2014-11-10 LAB — COMPREHENSIVE METABOLIC PANEL (CC13)
ALBUMIN: 3.4 g/dL — AB (ref 3.5–5.0)
ALK PHOS: 84 U/L (ref 40–150)
ALT: 15 U/L (ref 0–55)
AST: 22 U/L (ref 5–34)
Anion Gap: 7 mEq/L (ref 3–11)
BILIRUBIN TOTAL: 0.4 mg/dL (ref 0.20–1.20)
BUN: 8.9 mg/dL (ref 7.0–26.0)
CALCIUM: 8.9 mg/dL (ref 8.4–10.4)
CO2: 26 mEq/L (ref 22–29)
Chloride: 109 mEq/L (ref 98–109)
Creatinine: 0.7 mg/dL (ref 0.6–1.1)
EGFR: 85 mL/min/{1.73_m2} — AB (ref >90)
GLUCOSE: 99 mg/dL (ref 70–140)
POTASSIUM: 4.6 meq/L (ref 3.5–5.1)
SODIUM: 142 meq/L (ref 136–145)
TOTAL PROTEIN: 6.3 g/dL — AB (ref 6.4–8.3)

## 2014-11-10 LAB — CBC WITH DIFFERENTIAL/PLATELET
BASO%: 0.4 % (ref 0.0–2.0)
BASOS ABS: 0 10*3/uL (ref 0.0–0.1)
EOS ABS: 0 10*3/uL (ref 0.0–0.5)
EOS%: 0.4 % (ref 0.0–7.0)
HEMATOCRIT: 32.2 % — AB (ref 34.8–46.6)
HEMOGLOBIN: 10.7 g/dL — AB (ref 11.6–15.9)
LYMPH#: 1.2 10*3/uL (ref 0.9–3.3)
LYMPH%: 22.5 % (ref 14.0–49.7)
MCH: 35.5 pg — AB (ref 25.1–34.0)
MCHC: 33.2 g/dL (ref 31.5–36.0)
MCV: 107 fL — AB (ref 79.5–101.0)
MONO#: 0.2 10*3/uL (ref 0.1–0.9)
MONO%: 3.7 % (ref 0.0–14.0)
NEUT#: 3.9 10*3/uL (ref 1.5–6.5)
NEUT%: 73 % (ref 38.4–76.8)
NRBC: 0 % (ref 0–0)
PLATELETS: 57 10*3/uL — AB (ref 145–400)
RBC: 3.01 10*6/uL — ABNORMAL LOW (ref 3.70–5.45)
RDW: 18.2 % — AB (ref 11.2–14.5)
WBC: 5.3 10*3/uL (ref 3.9–10.3)

## 2014-11-10 NOTE — Progress Notes (Signed)
Patient Care Team: [name], MD as PCP - General (Family Medicine)  DIAGNOSIS: No matching staging information was found for the patient.  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 With the last cycle of chemotherapy she had profound rash macular reddish rash all over her body. It was subsequently followed by itching. It lasted 2-3 days. She took Benadryl and apply topical cortisone cream. Finally the rash appears to be fading away. She still has obtained rash on her upper extremities.  REVIEW OF SYSTEMS:   Constitutional: Denies fevers, chills or abnormal weight loss Eyes: Denies blurriness of vision Ears, nose, mouth, throat, and face: Denies mucositis or sore throat Respiratory: Denies cough, dyspnea or wheezes Cardiovascular: Denies palpitation, chest discomfort or lower extremity swelling Gastrointestinal:  Denies nausea, heartburn or change in bowel habits Skin: rash throughout her body which is resolving Lymphatics: Denies new lymphadenopathy or easy bruising Neurological:Denies numbness, tingling or new weaknesses Behavioral/Psych: Mood is stable, no new changes  Breast:marked decrease in size of the right breast tumor All other systems  were reviewed with the patient and are negative.  I have reviewed the past medical history, past surgical history, social history and family history with the patient and they are unchanged from previous note.  ALLERGIES:  has No Known Allergies.  MEDICATIONS:  Current Outpatient Prescriptions  Medication Sig Dispense Refill  . dexamethasone (DECADRON) 4 MG tablet Take 1 tablet (4 mg total) by mouth 2 (two) times daily. Start the day before Taxotere. Then again the day after chemo for 3 days. 30 tablet 1  . docusate sodium (COLACE) 100 MG capsule Take 100 mg by mouth 3 (three) times daily as needed for mild constipation (constipation).    [name] HYDROcodone-acetaminophen (NORCO/VICODIN) 5-325 MG per tablet Take 1-2 tablets by mouth every 4 (four) hours as needed for moderate pain or severe pain. 25 tablet 0  . lidocaine-prilocaine (EMLA) cream Apply to affected area once 30 g 3  . LORazepam (ATIVAN) 0.5 MG tablet Take 1 tablet (0.5 mg total) by mouth at bedtime. 30 tablet 1  . mineral oil enema Place 133 mLs (1 enema total) rectally once. 266 mL 0  . ondansetron (ZOFRAN) 8 MG tablet Take 1 tablet (8 mg total) by mouth 2 (two) times daily. Start the day after chemo for 3 days. Then take as needed for nausea or vomiting. 30 tablet 1  . prochlorperazine (COMPAZINE) 10 MG tablet Take 1 tablet (10 mg total) by mouth every 6 (six) hours as needed (Nausea or vomiting). 30 tablet 1   No current facility-administered medications for this visit.    PHYSICAL EXAMINATION: ECOG PERFORMANCE STATUS: 1 - Symptomatic but completely ambulatory  Filed Vitals:   11/10/14 0832  BP: 141/72  Pulse: 86  Temp: 98.3 F (36.8 C)  Resp: 18   Filed Weights   11/10/14 0832  Weight: 110 lb 9.6 oz (50.168 kg)    GENERAL:alert, no distress and comfortable SKIN: skin color, texture, turgor are normal, no rashes or significant lesions EYES: normal, Conjunctiva are pink and non-injected, sclera clear OROPHARYNX:no  exudate, no erythema and lips, buccal mucosa, and tongue normal  NECK: supple, thyroid normal size, non-tender, without nodularity LYMPH:  no palpable lymphadenopathy in the cervical, axillary or inguinal LUNGS: clear to auscultation and percussion with normal breathing effort HEART: regular rate & rhythm and no murmurs and no lower extremity edema ABDOMEN:abdomen soft, non-tender and normal bowel sounds Musculoskeletal:no cyanosis of digits and no clubbing  NEURO: alert & oriented x 3 with fluent speech, no focal motor/sensory deficits  LABORATORY DATA:  I have reviewed the data as listed   Chemistry      Component Value Date/Time   NA 142 11/10/2014 0805   NA 140 07/18/2014 0151   K 4.6 11/10/2014 0805   K 3.6 07/18/2014 0151   CL 102 07/18/2014 0151   CO2 26 11/10/2014 0805   CO2 27 07/10/2014 1120   BUN 8.9 11/10/2014 0805   BUN 19 07/18/2014 0151   CREATININE 0.7 11/10/2014 0805   CREATININE 0.70 07/18/2014 0151      Component Value Date/Time   CALCIUM 8.9 11/10/2014 0805   CALCIUM 10.1 07/10/2014 1120   ALKPHOS 84 11/10/2014 0805   ALKPHOS 70 07/10/2014 1120   AST 22 11/10/2014 0805   AST 25 07/10/2014 1120   ALT 15 11/10/2014 0805   ALT 19 07/10/2014 1120   BILITOT 0.40 11/10/2014 0805   BILITOT 0.5 07/10/2014 1120       Lab Results  Component Value Date   WBC 5.3 11/10/2014   HGB 10.7* 11/10/2014   HCT 32.2* 11/10/2014   MCV 107.0* 11/10/2014   PLT 57* 11/10/2014   NEUTROABS 3.9 11/10/2014    ASSESSMENT & PLAN:  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 Neoadjuvant chemotherapy with Taxotere, carboplatin, Herceptin, Perjeta every 3 weeks 6 cycles (started 07/14/2014  completed 10/28/2014) followed by Herceptin maintenance every 3 weeks for 1 year 2. After 6 cycles of chemotherapy, breast surgery 3. Followed by radiation therapy 4. Followed by antiestrogen therapy with aromatase inhibitor  Breast MRI 11/06/2014: Right breast mass has significantly decreased in size and is less masslike, maximal enhancement is 1.5 cm previously 2.4 cm.I reviewed the scan in great detail with the patient showed the scans from before and after. It is difficult to predict actual response because the remainder of the tumor appears to be a non-mass enhancement.  Patient is hopeful to get a lumpectomy given the significant response to chemotherapy. Thrombocytopenia: Platelets 57, platelet count will need to be rechecked prior to surgery. Profound rash after the last cycle of chemotherapy: I suspect it may be related to either Taxotere or Perjeta.  start maintenance Herceptin every 3 weeks 11/20/2014 and follow-up every 6 weeks.  No orders of the defined types were placed in this encounter.   The patient has a good understanding of the overall plan. she agrees with it. she will call with any problems that may develop before the next  visit here.   Rulon Eisenmenger, MD

## 2014-11-10 NOTE — Telephone Encounter (Signed)
Appointments added per pof and patient will get a new avs in chemo °

## 2014-11-10 NOTE — Assessment & Plan Note (Addendum)
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 Neoadjuvant chemotherapy with Taxotere, carboplatin, Herceptin, Perjeta every 3 weeks 6 cycles (started 07/14/2014 completed 10/28/2014) followed by Herceptin maintenance every 3 weeks for 1 year 2. After 6 cycles of chemotherapy, breast surgery 3. Followed by radiation therapy 4. Followed by antiestrogen therapy with aromatase inhibitor  Breast MRI 11/06/2014: Right breast mass has significantly decreased in size and is less masslike, maximal enhancement is 1.5 cm previously 2.4 cm.I reviewed the scan in great detail with the patient showed the scans from before and after. It is difficult to predict actual response because the remainder of the tumor appears to be a non-mass enhancement.  Return to clinic after surgery to discuss the final pathology report. Continue maintenance Herceptin every 3 weeks follow-up every 6 weeks.

## 2014-11-12 NOTE — Progress Notes (Signed)
Late entry: Patient began vomiting at end of treatment. Patient did not want an anti-emetic "if I go home and rest I'll feel better." Patient encouraged to stay for additional evaluation but she stated that she wanted to ring the bell and go home.

## 2014-11-17 ENCOUNTER — Encounter (HOSPITAL_COMMUNITY): Payer: Self-pay

## 2014-11-17 ENCOUNTER — Ambulatory Visit (HOSPITAL_BASED_OUTPATIENT_CLINIC_OR_DEPARTMENT_OTHER)
Admission: RE | Admit: 2014-11-17 | Discharge: 2014-11-17 | Disposition: A | Discharge status: Home / Self Care | Payer: Medicare Other | Source: Ambulatory Visit | Attending: Cardiology | Admitting: Cardiology

## 2014-11-17 ENCOUNTER — Ambulatory Visit (HOSPITAL_COMMUNITY)
Admission: RE | Admit: 2014-11-17 | Discharge: 2014-11-17 | Disposition: A | Discharge status: Home / Self Care | Payer: Medicare Other | Source: Ambulatory Visit | Attending: Family Medicine | Admitting: Family Medicine

## 2014-11-17 VITALS — BP 124/62 | HR 77 | Ht 65.0 in | Wt 110.8 lb

## 2014-11-17 DIAGNOSIS — C50211 Malignant neoplasm of upper-inner quadrant of right female breast: Secondary | ICD-10-CM

## 2014-11-17 DIAGNOSIS — F17200 Nicotine dependence, unspecified, uncomplicated: Secondary | ICD-10-CM

## 2014-11-17 DIAGNOSIS — I071 Rheumatic tricuspid insufficiency: Secondary | ICD-10-CM | POA: Insufficient documentation

## 2014-11-17 DIAGNOSIS — Z79899 Other long term (current) drug therapy: Secondary | ICD-10-CM

## 2014-11-17 DIAGNOSIS — Z720 Tobacco use: Secondary | ICD-10-CM | POA: Diagnosis not present

## 2014-11-17 NOTE — Progress Notes (Signed)
[place] NOTE   Patient ID: Marilyn Dalton, female   DOB: April 14, 1940, 74 y.o.   MRN: [id number] Referring Physician: Dr Lindi Adie  Primary Care: Primary HF Cardiologist: Dr Haroldine Laws  HPI:  Marilyn Dalton is a 74 year old woman with h/o  right breast cancer, ongoing tobacco use and diverticulosis who isreferred to the cardio-oncology clinic by Dr Lindi Adie.   She denies any known cardiac history. She states she smokes but has remained very active and exercises daily without problem.  Recently found to have cancer of her right breast. 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  Has undergone neoadjuvant chemotherapy with Taxotere, carboplatin, Herceptin, Perjeta every 3 weeks 6 cycles (started 07/14/2014 completed 10/28/2014). This will be followed by Herceptin maintenance every 3 weeks for 1 year. She is pending surgery with Dr. Abran Cantor followed by XRT and antiestrogen therapy with aromatase inhibitor  ECHO 06/2014         EF 60-65% Grade I DD Lateral S' 10.1 GLS -19.6  ECHO 11/17/2014    EF 60-65% Trivial MR Mild TR RV normal Grade I DD Lateral S' 10.1  GLS -20.5   Family History: Mom CVAs   Review of Systems: [y] = yes, _0  = no   General: Weight gain _1 ; Weight loss _2 ; Anorexia _3 ; Fatigue Blue.Reese ]; Fever _4 ; Chills _5 ; Weakness _6   Cardiac: Chest pain/pressure _7 ; Resting SOB _8 ; Exertional SOB _9 ; Orthopnea _10 ; Pedal Edema _11 ; Palpitations _12 ; Syncope _13 ; Presyncope _14 ; Paroxysmal nocturnal dyspnea_15   Pulmonary: Cough _16 ; Wheezing_17 ; Hemoptysis_18 ; Sputum _19 ; Snoring _20   GI: Vomiting_21 ; Dysphagia_22 ; Melena_23 ; Hematochezia _24 ; Heartburn_25 ; Abdominal pain _26 ; Constipation _27 ; Diarrhea _28 ; BRBPR _29   GU: Hematuria_30 ;  Dysuria _31 ; Nocturia_32   Vascular: Pain in legs with walking _33 ; Pain in feet with lying flat _34 ; Non-healing sores _35 ; Stroke _36 ; TIA _37 ; Slurred speech _38 ;  Neuro: Headaches_39 ; Vertigo_40 ; Seizures_41 ; Paresthesias_42 ;Blurred vision _43 ; Diplopia _44 ; Vision changes _45   Ortho/Skin: Arthritis _46 ; Joint pain _47 ; Muscle pain _48 ; Joint swelling _49 ; Back Pain _50 ; Rash _51   Psych: Depression_52 ; Anxiety_53   Heme: Bleeding problems _54 ; Clotting disorders _55 ; Anemia _56   Endocrine: Diabetes _57 ; Thyroid dysfunction_58    Past Medical History  Diagnosis Date  . Arthritis   . Breast cancer 06/20/14    right breast  . Allergy   . GERD (gastroesophageal reflux disease)   . Diverticulitis     Current Outpatient Prescriptions  Medication Sig Dispense Refill  . dexamethasone (DECADRON) 4 MG tablet Take 1 tablet (4 mg total) by mouth 2 (two) times daily. Start the day before Taxotere. Then again the day after chemo for 3 days. 30 tablet 1  . docusate sodium (COLACE) 100 MG capsule Take 100 mg by mouth 3 (three) times daily as needed for mild constipation (constipation).    [name] HYDROcodone-acetaminophen (NORCO/VICODIN) 5-325 MG per tablet Take 1-2 tablets by mouth every 4 (four) hours as needed for moderate pain or severe pain.  25 tablet 0  . lidocaine-prilocaine (EMLA) cream Apply to affected area once 30 g 3  . LORazepam (ATIVAN) 0.5 MG tablet Take 1 tablet (0.5 mg total) by mouth at bedtime. 30 tablet 1  . mineral oil enema Place 133 mLs (1 enema total) rectally once. 266 mL 0  . ondansetron (ZOFRAN) 8 MG tablet Take 1 tablet (8 mg total) by mouth 2 (two) times daily. Start the day after chemo for 3 days. Then take as needed for nausea or vomiting. 30 tablet 1  . prochlorperazine (COMPAZINE) 10 MG tablet Take 1 tablet (10 mg total) by mouth every 6 (six) hours as needed (Nausea or vomiting). 30 tablet 1   No current facility-administered medications for this encounter.    No Known  Allergies    Social History   Social History  . Marital Status: Married    Spouse Name: N/A  . Number of Children: N/A  . Years of Education: N/A   Occupational History  . Not on file.   Social History Main Topics  . Smoking status: Current Every Day Smoker  . Smokeless tobacco: Not on file  . Alcohol Use: Yes     Comment: OCCASIONAL  . Drug Use: No  . Sexual Activity: Not on file   Other Topics Concern  . Not on file   Social History Narrative      Family History  Problem Relation Age of Onset  . CVA Mother     Danley Danker Vitals:   11/17/14 1151  BP: 124/62  Pulse: 77  Height: _0  (1.651 m)  Weight: 110 lb 12.8 oz (50.259 kg)  SpO2: 94%    PHYSICAL EXAM: General:  Thin. Alopecic. No respiratory difficulty. Husband present  HEENT: normal x for alopecia Neck: supple. no JVD. RIJ port Carotids 2+ bilat; no bruits. No lymphadenopathy or thryomegaly appreciated. Cor: PMI nondisplaced. Regular rate & rhythm. No rubs, gallops or murmurs. Lungs: clear with decreased BS Abdomen: soft, nontender, nondistended. No hepatosplenomegaly. No bruits or masses. Good bowel sounds. Extremities: no cyanosis, clubbing, rash, edema Neuro: alert & oriented x 3, cranial nerves grossly intact. moves all 4 extremities w/o difficulty. Affect pleasant   ASSESSMENT & PLAN:  1. R Breast Cancer -HER-2 positive ratio 2.97, T2 N0 M0 stage II A  Completed 6 cycles of neoadjuvant chemotherapy with Taxotere, carboplatin, Herceptin, Perjeta. (started 07/14/2014 completed 10/28/2014). Now she continues on  Herceptin maintenance every 3 weeks for 1 year.  Today Dr Haroldine Laws discussed and reviewed ECHO results from May and as well as the ECHO today. EF and doppler parameters remain stable.  Discussed purpose of cardio-onc clinic and that there is ~10% chance of cardiotoxicity related to herceptin.   Plan to follow ECHO every 3 months while on herceptin.   CLEGG,AMY NP-C 12:13 PM  Patient seen  and examined with Darrick Grinder, NP. We discussed all aspects of the encounter. I agree with the assessment and plan as stated above.   Explained incidence of Herceptin cardiotoxicity and role of Cardio-oncology clinic at length. Echo images reviewed personally. All parameters stable. Reviewed signs and symptoms of HF to look for. Continue Herceptin. Follow-up with echo in 3 months. Reinforced need to quit smoking but she is uninterested in cessation.   Bensimhon, Daniel,MD 12:55 AM

## 2014-11-17 NOTE — Progress Notes (Signed)
  Echocardiogram 2D Echocardiogram has been performed.  Marilyn Dalton 11/17/2014, 10:21 AM

## 2014-11-17 NOTE — Patient Instructions (Signed)
Follow up in 3 months with an ECHO 

## 2014-11-18 ENCOUNTER — Ambulatory Visit (HOSPITAL_BASED_OUTPATIENT_CLINIC_OR_DEPARTMENT_OTHER): Payer: Medicare Other

## 2014-11-18 ENCOUNTER — Other Ambulatory Visit: Payer: Self-pay | Admitting: *Deleted

## 2014-11-18 ENCOUNTER — Encounter: Payer: Self-pay | Admitting: *Deleted

## 2014-11-18 ENCOUNTER — Other Ambulatory Visit (HOSPITAL_BASED_OUTPATIENT_CLINIC_OR_DEPARTMENT_OTHER): Payer: Medicare Other

## 2014-11-18 VITALS — BP 135/79 | HR 80 | Temp 98.1°F | Resp 19

## 2014-11-18 DIAGNOSIS — Z5112 Encounter for antineoplastic immunotherapy: Secondary | ICD-10-CM

## 2014-11-18 DIAGNOSIS — R300 Dysuria: Secondary | ICD-10-CM | POA: Diagnosis not present

## 2014-11-18 DIAGNOSIS — C50211 Malignant neoplasm of upper-inner quadrant of right female breast: Secondary | ICD-10-CM

## 2014-11-18 DIAGNOSIS — C50811 Malignant neoplasm of overlapping sites of right female breast: Secondary | ICD-10-CM

## 2014-11-18 DIAGNOSIS — F17200 Nicotine dependence, unspecified, uncomplicated: Secondary | ICD-10-CM | POA: Insufficient documentation

## 2014-11-18 LAB — COMPREHENSIVE METABOLIC PANEL (CC13)
ALBUMIN: 3.5 g/dL (ref 3.5–5.0)
ALK PHOS: 89 U/L (ref 40–150)
ALT: 14 U/L (ref 0–55)
AST: 19 U/L (ref 5–34)
Anion Gap: 8 mEq/L (ref 3–11)
BUN: 6.7 mg/dL — ABNORMAL LOW (ref 7.0–26.0)
CALCIUM: 9.2 mg/dL (ref 8.4–10.4)
CHLORIDE: 110 meq/L — AB (ref 98–109)
CO2: 26 mEq/L (ref 22–29)
Creatinine: 0.7 mg/dL (ref 0.6–1.1)
EGFR: 87 mL/min/{1.73_m2} — AB (ref >90)
Glucose: 100 mg/dl (ref 70–140)
POTASSIUM: 4.6 meq/L (ref 3.5–5.1)
SODIUM: 144 meq/L (ref 136–145)
Total Bilirubin: <0.3 mg/dL (ref 0.20–1.20)
Total Protein: 6.5 g/dL (ref 6.4–8.3)

## 2014-11-18 LAB — URINALYSIS, MICROSCOPIC - CHCC
Bilirubin (Urine): NEGATIVE
GLUCOSE UR CHCC: NEGATIVE mg/dL
Ketones: NEGATIVE mg/dL
Leukocyte Esterase: NEGATIVE
NITRITE: NEGATIVE
PH: 6.5 (ref 4.6–8.0)
PROTEIN: NEGATIVE mg/dL
Specific Gravity, Urine: 1.01 (ref 1.003–1.035)
UROBILINOGEN UR: 0.2 mg/dL (ref 0.2–1)

## 2014-11-18 LAB — CBC WITH DIFFERENTIAL/PLATELET
BASO%: 0.5 % (ref 0.0–2.0)
BASOS ABS: 0 10*3/uL (ref 0.0–0.1)
EOS ABS: 0 10*3/uL (ref 0.0–0.5)
EOS%: 0.5 % (ref 0.0–7.0)
HEMATOCRIT: 32.5 % — AB (ref 34.8–46.6)
HEMOGLOBIN: 10.8 g/dL — AB (ref 11.6–15.9)
LYMPH%: 41.5 % (ref 14.0–49.7)
MCH: 36.3 pg — AB (ref 25.1–34.0)
MCHC: 33.3 g/dL (ref 31.5–36.0)
MCV: 109 fL — AB (ref 79.5–101.0)
MONO#: 0.4 10*3/uL (ref 0.1–0.9)
MONO%: 9.6 % (ref 0.0–14.0)
NEUT#: 1.7 10*3/uL (ref 1.5–6.5)
NEUT%: 47.9 % (ref 38.4–76.8)
Platelets: 148 10*3/uL (ref 145–400)
RBC: 2.99 10*6/uL — ABNORMAL LOW (ref 3.70–5.45)
RDW: 18.7 % — AB (ref 11.2–14.5)
WBC: 3.6 10*3/uL — ABNORMAL LOW (ref 3.9–10.3)
lymph#: 1.5 10*3/uL (ref 0.9–3.3)

## 2014-11-18 MED ORDER — SODIUM CHLORIDE 0.9 % IV SOLN
Freq: Once | INTRAVENOUS | Status: AC
  Administered 2014-11-18: 10:00:00 via INTRAVENOUS
  Filled 2014-11-18: qty 4

## 2014-11-18 MED ORDER — SODIUM CHLORIDE 0.9 % IJ SOLN
10.0000 mL | INTRAMUSCULAR | Status: DC | PRN
  Administered 2014-11-18: 10 mL
  Filled 2014-11-18: qty 10

## 2014-11-18 MED ORDER — DIPHENHYDRAMINE HCL 25 MG PO CAPS
50.0000 mg | ORAL_CAPSULE | Freq: Once | ORAL | Status: AC
  Administered 2014-11-18: 50 mg via ORAL

## 2014-11-18 MED ORDER — SODIUM CHLORIDE 0.9 % IV SOLN
Freq: Once | INTRAVENOUS | Status: AC
  Administered 2014-11-18: 09:00:00 via INTRAVENOUS

## 2014-11-18 MED ORDER — HEPARIN SOD (PORK) LOCK FLUSH 100 UNIT/ML IV SOLN
500.0000 [IU] | Freq: Once | INTRAVENOUS | Status: AC | PRN
  Administered 2014-11-18: 500 [IU]
  Filled 2014-11-18: qty 5

## 2014-11-18 MED ORDER — ACETAMINOPHEN 325 MG PO TABS
650.0000 mg | ORAL_TABLET | Freq: Once | ORAL | Status: AC
  Administered 2014-11-18: 650 mg via ORAL

## 2014-11-18 MED ORDER — DIPHENHYDRAMINE HCL 25 MG PO CAPS
ORAL_CAPSULE | ORAL | Status: AC
  Filled 2014-11-18: qty 2

## 2014-11-18 MED ORDER — ACETAMINOPHEN 325 MG PO TABS
ORAL_TABLET | ORAL | Status: AC
  Filled 2014-11-18: qty 2

## 2014-11-18 MED ORDER — TRASTUZUMAB CHEMO INJECTION 440 MG
6.0000 mg/kg | Freq: Once | INTRAVENOUS | Status: AC
  Administered 2014-11-18: 294 mg via INTRAVENOUS
  Filled 2014-11-18: qty 14

## 2014-11-18 NOTE — Patient Instructions (Signed)
Trastuzumab injection for infusion What is this medicine? TRASTUZUMAB (tras TOO zoo mab) is a monoclonal antibody. It targets a protein called HER2. This protein is found in some stomach and breast cancers. This medicine can stop cancer cell growth. This medicine may be used with other cancer treatments. This medicine may be used for other purposes; ask your health care provider or pharmacist if you have questions. COMMON BRAND NAME(S): Herceptin What should I tell my health care provider before I take this medicine? They need to know if you have any of these conditions: -heart disease -heart failure -infection (especially a virus infection such as chickenpox, cold sores, or herpes) -lung or breathing disease, like asthma -recent or ongoing radiation therapy -an unusual or allergic reaction to trastuzumab, benzyl alcohol, or other medications, foods, dyes, or preservatives -pregnant or trying to get pregnant -breast-feeding How should I use this medicine? This drug is given as an infusion into a vein. It is administered in a hospital or clinic by a specially trained health care professional. Talk to your pediatrician regarding the use of this medicine in children. This medicine is not approved for use in children. Overdosage: If you think you have taken too much of this medicine contact a poison control center or emergency room at once. NOTE: This medicine is only for you. Do not share this medicine with others. What if I miss a dose? It is important not to miss a dose. Call your doctor or health care professional if you are unable to keep an appointment. What may interact with this medicine? -cyclophosphamide -doxorubicin -warfarin This list may not describe all possible interactions. Give your health care provider a list of all the medicines, herbs, non-prescription drugs, or dietary supplements you use. Also tell them if you smoke, drink alcohol, or use illegal drugs. Some items may  interact with your medicine. What should I watch for while using this medicine? Visit your doctor for checks on your progress. Report any side effects. Continue your course of treatment even though you feel ill unless your doctor tells you to stop. Call your doctor or health care professional for advice if you get a fever, chills or sore throat, or other symptoms of a cold or flu. Do not treat yourself. Try to avoid being around people who are sick. You may experience fever, chills and shaking during your first infusion. These effects are usually mild and can be treated with other medicines. Report any side effects during the infusion to your health care professional. Fever and chills usually do not happen with later infusions. What side effects may I notice from receiving this medicine? Side effects that you should report to your doctor or other health care professional as soon as possible: -breathing difficulties -chest pain or palpitations -cough -dizziness or fainting -fever or chills, sore throat -skin rash, itching or hives -swelling of the legs or ankles -unusually weak or tired Side effects that usually do not require medical attention (report to your doctor or other health care professional if they continue or are bothersome): -loss of appetite -headache -muscle aches -nausea This list may not describe all possible side effects. Call your doctor for medical advice about side effects. You may report side effects to FDA at 1-800-FDA-1088. Where should I keep my medicine? This drug is given in a hospital or clinic and will not be stored at home. NOTE: This sheet is a summary. It may not cover all possible information. If you have questions about this medicine, talk   to your doctor, pharmacist, or health care provider.  2015, Elsevier/Gold Standard. (2008-12-12 13:43:15)   [facility] Discharge Instructions for Patients Receiving Chemotherapy  Today you received the  following chemotherapy agents Herceptin.  To help prevent nausea and vomiting after your treatment, we encourage you to take your nausea medication as directed.   If you develop nausea and vomiting that is not controlled by your nausea medication, call the clinic.   BELOW ARE SYMPTOMS THAT SHOULD BE REPORTED IMMEDIATELY:  *FEVER GREATER THAN 100.5 F  *CHILLS WITH OR WITHOUT FEVER  NAUSEA AND VOMITING THAT IS NOT CONTROLLED WITH YOUR NAUSEA MEDICATION  *UNUSUAL SHORTNESS OF BREATH  *UNUSUAL BRUISING OR BLEEDING  TENDERNESS IN MOUTH AND THROAT WITH OR WITHOUT PRESENCE OF ULCERS  *URINARY PROBLEMS  *BOWEL PROBLEMS  UNUSUAL RASH Items with * indicate a potential emergency and should be followed up as soon as possible.  Feel free to call the clinic you have any questions or concerns. The clinic phone number is [phone].  Please show the CHEMO ALERT CARD at check-in to the Emergency Department and triage nurse.    

## 2014-11-18 NOTE — Progress Notes (Signed)
Patient complaining of burning with urination today when I spoke with her in chemo.  She is here today for herceptin only.  UA and culture ordered.  No other complaints today.

## 2014-11-18 NOTE — Progress Notes (Signed)
Pt c/o pain with urination.  UA/Urine Culture ordered and obtained.  Prior to leaving infusion area, pt informed no intervention needed per Dr. Lindi Adie.  Pt instructed to drink lots of fluids and flush system.  Pt verbalized understanding and told to call with any changes/concerns.

## 2014-11-19 ENCOUNTER — Telehealth: Payer: Self-pay | Admitting: *Deleted

## 2014-11-19 LAB — URINE CULTURE

## 2014-11-19 NOTE — Telephone Encounter (Signed)
Per staff message from breast navigator I have adjusted  appts. Navigator will contact the patient. JMW

## 2014-11-21 ENCOUNTER — Other Ambulatory Visit: Payer: Self-pay | Admitting: General Surgery

## 2014-11-21 DIAGNOSIS — C50911 Malignant neoplasm of unspecified site of right female breast: Secondary | ICD-10-CM

## 2014-11-26 ENCOUNTER — Ambulatory Visit: Payer: Medicare Other | Admitting: Hematology and Oncology

## 2014-11-26 ENCOUNTER — Ambulatory Visit: Payer: Medicare Other

## 2014-11-26 ENCOUNTER — Encounter (HOSPITAL_BASED_OUTPATIENT_CLINIC_OR_DEPARTMENT_OTHER): Payer: Self-pay | Admitting: *Deleted

## 2014-11-26 ENCOUNTER — Other Ambulatory Visit: Payer: Medicare Other

## 2014-11-26 NOTE — Pre-Procedure Instructions (Signed)
Patient would like to discuss possible latex reaction prior to surgery.

## 2014-12-01 ENCOUNTER — Ambulatory Visit
Admission: RE | Admit: 2014-12-01 | Discharge: 2014-12-01 | Disposition: A | Discharge status: Home / Self Care | Payer: Medicare Other | Source: Ambulatory Visit | Attending: General Surgery | Admitting: General Surgery

## 2014-12-01 DIAGNOSIS — C50911 Malignant neoplasm of unspecified site of right female breast: Secondary | ICD-10-CM

## 2014-12-02 ENCOUNTER — Encounter (HOSPITAL_BASED_OUTPATIENT_CLINIC_OR_DEPARTMENT_OTHER)
Admission: RE | Disposition: A | Discharge status: Home / Self Care | Payer: Self-pay | Source: Ambulatory Visit | Attending: General Surgery

## 2014-12-02 ENCOUNTER — Ambulatory Visit
Admission: RE | Admit: 2014-12-02 | Discharge: 2014-12-02 | Disposition: A | Discharge status: Home / Self Care | Payer: Medicare Other | Source: Ambulatory Visit | Attending: General Surgery | Admitting: General Surgery

## 2014-12-02 ENCOUNTER — Ambulatory Visit (HOSPITAL_BASED_OUTPATIENT_CLINIC_OR_DEPARTMENT_OTHER): Payer: Medicare Other | Admitting: Anesthesiology

## 2014-12-02 ENCOUNTER — Encounter (HOSPITAL_BASED_OUTPATIENT_CLINIC_OR_DEPARTMENT_OTHER): Payer: Self-pay | Admitting: *Deleted

## 2014-12-02 ENCOUNTER — Ambulatory Visit (HOSPITAL_COMMUNITY)
Admission: RE | Admit: 2014-12-02 | Discharge: 2014-12-02 | Disposition: A | Discharge status: Home / Self Care | Payer: Medicare Other | Source: Ambulatory Visit | Attending: General Surgery | Admitting: General Surgery

## 2014-12-02 ENCOUNTER — Ambulatory Visit (HOSPITAL_BASED_OUTPATIENT_CLINIC_OR_DEPARTMENT_OTHER)
Admission: RE | Admit: 2014-12-02 | Discharge: 2014-12-02 | Disposition: A | Discharge status: Home / Self Care | Payer: Medicare Other | Source: Ambulatory Visit | Attending: General Surgery | Admitting: General Surgery

## 2014-12-02 DIAGNOSIS — D0511 Intraductal carcinoma in situ of right breast: Secondary | ICD-10-CM | POA: Insufficient documentation

## 2014-12-02 DIAGNOSIS — C50211 Malignant neoplasm of upper-inner quadrant of right female breast: Secondary | ICD-10-CM

## 2014-12-02 DIAGNOSIS — Z170 Estrogen receptor positive status [ER+]: Secondary | ICD-10-CM | POA: Insufficient documentation

## 2014-12-02 DIAGNOSIS — C50911 Malignant neoplasm of unspecified site of right female breast: Secondary | ICD-10-CM

## 2014-12-02 DIAGNOSIS — Z9221 Personal history of antineoplastic chemotherapy: Secondary | ICD-10-CM | POA: Insufficient documentation

## 2014-12-02 HISTORY — PX: BREAST LUMPECTOMY WITH RADIOACTIVE SEED AND SENTINEL LYMPH NODE BIOPSY: SHX6550

## 2014-12-02 SURGERY — BREAST LUMPECTOMY WITH RADIOACTIVE SEED AND SENTINEL LYMPH NODE BIOPSY
Anesthesia: Regional | Site: Breast | Laterality: Right

## 2014-12-02 MED ORDER — FENTANYL CITRATE (PF) 100 MCG/2ML IJ SOLN
INTRAMUSCULAR | Status: AC
  Filled 2014-12-02: qty 2

## 2014-12-02 MED ORDER — METHYLENE BLUE 1 % INJ SOLN
INTRAMUSCULAR | Status: AC
  Filled 2014-12-02: qty 10

## 2014-12-02 MED ORDER — OXYCODONE HCL 5 MG/5ML PO SOLN: 5.0000 mg | Freq: Once | ORAL | Status: DC | PRN

## 2014-12-02 MED ORDER — SODIUM CHLORIDE 0.9 % IJ SOLN
INTRAMUSCULAR | Status: AC
  Filled 2014-12-02: qty 10

## 2014-12-02 MED ORDER — PHENYLEPHRINE HCL 10 MG/ML IJ SOLN
INTRAMUSCULAR | Status: DC | PRN
  Administered 2014-12-02: 80 ug via INTRAVENOUS

## 2014-12-02 MED ORDER — LIDOCAINE HCL (CARDIAC) 20 MG/ML IV SOLN
INTRAVENOUS | Status: DC | PRN
  Administered 2014-12-02: 50 mg via INTRAVENOUS

## 2014-12-02 MED ORDER — SCOPOLAMINE 1 MG/3DAYS TD PT72: 1.0000 | MEDICATED_PATCH | Freq: Once | TRANSDERMAL | Status: DC | PRN

## 2014-12-02 MED ORDER — DEXAMETHASONE SODIUM PHOSPHATE 10 MG/ML IJ SOLN
INTRAMUSCULAR | Status: AC
  Filled 2014-12-02: qty 1

## 2014-12-02 MED ORDER — FENTANYL CITRATE (PF) 100 MCG/2ML IJ SOLN
50.0000 ug | INTRAMUSCULAR | Status: DC | PRN
Start: 2014-12-02 — End: 2014-12-02
  Administered 2014-12-02 (×2): 50 ug via INTRAVENOUS

## 2014-12-02 MED ORDER — LIDOCAINE HCL (CARDIAC) 20 MG/ML IV SOLN
INTRAVENOUS | Status: AC
  Filled 2014-12-02: qty 5

## 2014-12-02 MED ORDER — PROPOFOL 10 MG/ML IV BOLUS
INTRAVENOUS | Status: DC | PRN
  Administered 2014-12-02: 200 mg via INTRAVENOUS

## 2014-12-02 MED ORDER — CHLORHEXIDINE GLUCONATE 4 % EX LIQD: 1.0000 "application " | Freq: Once | CUTANEOUS | Status: DC

## 2014-12-02 MED ORDER — MIDAZOLAM HCL 2 MG/2ML IJ SOLN
INTRAMUSCULAR | Status: AC
  Filled 2014-12-02: qty 4

## 2014-12-02 MED ORDER — HYDROCODONE-ACETAMINOPHEN 5-325 MG PO TABS: 1.0000 | ORAL_TABLET | ORAL | Status: DC | PRN

## 2014-12-02 MED ORDER — MIDAZOLAM HCL 2 MG/2ML IJ SOLN
1.0000 mg | INTRAMUSCULAR | Status: DC | PRN
  Administered 2014-12-02 (×2): 2 mg via INTRAVENOUS

## 2014-12-02 MED ORDER — CEFAZOLIN SODIUM-DEXTROSE 2-3 GM-% IV SOLR
INTRAVENOUS | Status: AC
  Filled 2014-12-02: qty 50

## 2014-12-02 MED ORDER — HYDROMORPHONE HCL 1 MG/ML IJ SOLN
INTRAMUSCULAR | Status: AC
  Filled 2014-12-02: qty 1

## 2014-12-02 MED ORDER — HYDROMORPHONE HCL 1 MG/ML IJ SOLN
0.2500 mg | INTRAMUSCULAR | Status: DC | PRN
  Administered 2014-12-02 (×2): 0.5 mg via INTRAVENOUS

## 2014-12-02 MED ORDER — CEFAZOLIN SODIUM-DEXTROSE 2-3 GM-% IV SOLR
2.0000 g | INTRAVENOUS | Status: AC
  Administered 2014-12-02: 2 g via INTRAVENOUS

## 2014-12-02 MED ORDER — TECHNETIUM TC 99M SULFUR COLLOID FILTERED: 1.0000 | Freq: Once | INTRAVENOUS | Status: DC | PRN

## 2014-12-02 MED ORDER — BUPIVACAINE-EPINEPHRINE (PF) 0.5% -1:200000 IJ SOLN
INTRAMUSCULAR | Status: AC
  Filled 2014-12-02: qty 30

## 2014-12-02 MED ORDER — SODIUM CHLORIDE 0.9 % IJ SOLN
INTRAMUSCULAR | Status: DC | PRN
  Administered 2014-12-02: 5 mL via INTRAMUSCULAR

## 2014-12-02 MED ORDER — BUPIVACAINE-EPINEPHRINE (PF) 0.5% -1:200000 IJ SOLN
INTRAMUSCULAR | Status: DC | PRN
  Administered 2014-12-02: 6 mL via PERINEURAL

## 2014-12-02 MED ORDER — DEXAMETHASONE SODIUM PHOSPHATE 4 MG/ML IJ SOLN
INTRAMUSCULAR | Status: DC | PRN
  Administered 2014-12-02: 6 mg via INTRAVENOUS

## 2014-12-02 MED ORDER — MIDAZOLAM HCL 2 MG/2ML IJ SOLN
INTRAMUSCULAR | Status: AC
  Filled 2014-12-02: qty 2

## 2014-12-02 MED ORDER — GLYCOPYRROLATE 0.2 MG/ML IJ SOLN: 0.2000 mg | Freq: Once | INTRAMUSCULAR | Status: DC | PRN

## 2014-12-02 MED ORDER — ONDANSETRON HCL 4 MG/2ML IJ SOLN
INTRAMUSCULAR | Status: DC | PRN
  Administered 2014-12-02: 4 mg via INTRAVENOUS

## 2014-12-02 MED ORDER — OXYCODONE HCL 5 MG PO TABS: 5.0000 mg | ORAL_TABLET | Freq: Once | ORAL | Status: DC | PRN

## 2014-12-02 MED ORDER — LACTATED RINGERS IV SOLN
INTRAVENOUS | Status: DC
  Administered 2014-12-02: 13:00:00 via INTRAVENOUS

## 2014-12-02 MED ORDER — PROMETHAZINE HCL 25 MG/ML IJ SOLN
6.2500 mg | INTRAMUSCULAR | Status: DC | PRN
Start: 2014-12-02 — End: 2014-12-02

## 2014-12-02 MED ORDER — ONDANSETRON HCL 4 MG/2ML IJ SOLN
INTRAMUSCULAR | Status: AC
  Filled 2014-12-02: qty 2

## 2014-12-02 SURGICAL SUPPLY — 46 items
APPLIER CLIP 9.375 MED OPEN (MISCELLANEOUS) ×3
APR CLP MED 9.3 20 MLT OPN (MISCELLANEOUS) ×1
BINDER BREAST MEDIUM (GAUZE/BANDAGES/DRESSINGS) ×2 IMPLANT
BLADE SURG 15 STRL LF DISP TIS (BLADE) ×1 IMPLANT
BLADE SURG 15 STRL SS (BLADE) ×3
CANISTER SUC SOCK COL 7IN (MISCELLANEOUS) IMPLANT
CANISTER SUCT 1200ML W/VALVE (MISCELLANEOUS) IMPLANT
CHLORAPREP W/TINT 26ML (MISCELLANEOUS) ×3 IMPLANT
CLIP APPLIE 9.375 MED OPEN (MISCELLANEOUS) ×1 IMPLANT
COVER BACK TABLE 60X90IN (DRAPES) ×3 IMPLANT
COVER MAYO STAND STRL (DRAPES) ×3 IMPLANT
COVER PROBE W GEL 5X96 (DRAPES) ×3 IMPLANT
DECANTER SPIKE VIAL GLASS SM (MISCELLANEOUS) IMPLANT
DEVICE DUBIN W/COMP PLATE 8390 (MISCELLANEOUS) ×3 IMPLANT
DRAPE LAPAROSCOPIC ABDOMINAL (DRAPES) ×3 IMPLANT
DRAPE UTILITY XL STRL (DRAPES) ×3 IMPLANT
ELECT COATED BLADE 2.86 ST (ELECTRODE) ×3 IMPLANT
ELECT REM PT RETURN 9FT ADLT (ELECTROSURGICAL) ×3
ELECTRODE REM PT RTRN 9FT ADLT (ELECTROSURGICAL) ×1 IMPLANT
GLOVE BIOGEL PI IND STRL 8 (GLOVE) ×1 IMPLANT
GLOVE BIOGEL PI INDICATOR 8 (GLOVE) ×2
GLOVE SURG SS PI 7.0 STRL IVOR (GLOVE) ×2 IMPLANT
GLOVE SURG SS PI 7.5 STRL IVOR (GLOVE) ×4 IMPLANT
GOWN STRL REUS W/ TWL LRG LVL3 (GOWN DISPOSABLE) ×1 IMPLANT
GOWN STRL REUS W/ TWL XL LVL3 (GOWN DISPOSABLE) ×1 IMPLANT
GOWN STRL REUS W/TWL LRG LVL3 (GOWN DISPOSABLE) ×3
GOWN STRL REUS W/TWL XL LVL3 (GOWN DISPOSABLE) ×3
KIT MARKER MARGIN INK (KITS) ×3 IMPLANT
LIQUID BAND (GAUZE/BANDAGES/DRESSINGS) ×3 IMPLANT
NDL HYPO 25X1 1.5 SAFETY (NEEDLE) ×2 IMPLANT
NDL SAFETY ECLIPSE 18X1.5 (NEEDLE) ×1 IMPLANT
NEEDLE HYPO 18GX1.5 SHARP (NEEDLE) ×3
NEEDLE HYPO 25X1 1.5 SAFETY (NEEDLE) ×6 IMPLANT
NS IRRIG 1000ML POUR BTL (IV SOLUTION) IMPLANT
PACK BASIN DAY SURGERY FS (CUSTOM PROCEDURE TRAY) ×3 IMPLANT
PENCIL BUTTON HOLSTER BLD 10FT (ELECTRODE) ×3 IMPLANT
SLEEVE SCD COMPRESS KNEE MED (MISCELLANEOUS) ×3 IMPLANT
SPONGE LAP 4X18 X RAY DECT (DISPOSABLE) ×3 IMPLANT
SUT MON AB 5-0 PS2 18 (SUTURE) ×3 IMPLANT
SUT VICRYL 3-0 CR8 SH (SUTURE) ×3 IMPLANT
SYR CONTROL 10ML LL (SYRINGE) ×6 IMPLANT
TOWEL OR 17X24 6PK STRL BLUE (TOWEL DISPOSABLE) ×3 IMPLANT
TOWEL OR NON WOVEN STRL DISP B (DISPOSABLE) ×3 IMPLANT
TUBE CONNECTING 20'X1/4 (TUBING) ×1
TUBE CONNECTING 20X1/4 (TUBING) ×1 IMPLANT
YANKAUER SUCT BULB TIP NO VENT (SUCTIONS) ×2 IMPLANT

## 2014-12-02 NOTE — Progress Notes (Signed)
Assisted Dr. Rob Fitzgerald with right, ultrasound guided, pectoralis block. Side rails up, monitors on throughout procedure. See vital signs in flow sheet. Tolerated Procedure well. 

## 2014-12-02 NOTE — Op Note (Signed)
Preoperative Diagnosis: cancer right breast  Postoprative Diagnosis: cancer right breast  Procedure: Procedure(s): BLUE DYE INJECTION RIGHT BREAST,RIGHT BREAST LUMPECTOMY WITH RADIOACTIVE SEED LOCALIZATION AND SENTINEL LYMPH NODE BIOPSY   Surgeon: [name] T   Assistants: None  Anesthesia:  General LMA anesthesia  Indications: Patient is a 74 year old female with a diagnosis approximately 6 months ago of a 2.5 cm invasive ductal carcinoma, ER PR positive, HER-2 positive upper outer right breast. She has undergone neoadjuvant chemotherapy and recent MRI shows reduction in tumor size to 1.5 cm. We have elected to proceed with radioactive seed localized right breast lumpectomy and right axillary sentinel lymph node biopsy as surgical therapy. The procedure and indications as well as alternatives and risks have been discussed extensively detailed elsewhere.    Procedure Detail:  Preoperatively the patient underwent accurate placement of an 45 radioactive seed in the upper outer right breast at the tumor clip site.  Preoperatively in the holding area she underwent injection of 1 mCi of technetium sulfur colloid intradermally around the right nipple. See placement was confirmed in the holding area. She was brought to the operating room, placed in supine position on the operating table, adequate laryngeal mask general anesthesia induced. She received preoperative IV antibiotics. Under sterile technique after patient timeout I injected 5 mL of dilute methylene blue subcutaneously beneath the right nipple and massaged this for several minutes. Following this the entire right anterior chest, axilla and upper arm were widely sterilely prepped and draped. The lumpectomy was approached initially. An area of maximum counts was localized in the upper outer right breast just off the areolar border. A curvilinear incision was made here just off the areolar border and dissection carried into the  subcutaneous tissue. Short skin and subcutaneous cutaneous flaps were raised. Breast tissue here was very dense. There was not a discrete mass. Using the neoprobe for guidance I excised a generous specimen of breast tissue around the area of high counts. This extended down to the chest wall and posterior fascia was taken. The specimen measured 4 x 3 x 2 cm. This was inked for margins. Specimen mammography confirmed the tumor marking clip and seeds centrally located within the specimen. The lumpectomy cavity was marked with clips. Complete hemostasis was assured. I mobilized the breast tissue from the upper outer quadrant taking it off the chest wall and mobilizing skin and the subcutaneous above it. This tissue was then able to be rotated into the lumpectomy cavity and this was closed with interrupted 2-0 Vicryl's. Subcutaneous tissue was closed with interrupted 3-0 Vicryl. Attention was then turned to the sentinel lymph node biopsy. A hot area in the right axilla was localized and a small transverse incision made. Dissection was carried down through the skin and subcutaneous tissue. Immediately apparent was a normal-sized blue node with high counts. This was excised and ex vivo had counts of over 400. It was sent as hot blue axillary sentinel lymph node #1. Using the neoprobe for guidance in the axilla was able to identify another node with counts of about 150 Santos hot axillary node #2, a third node with blue dye and minimal counts was sent as hot blue axillary node #3 and finally noted with elevated counts on the order of 75 was noted that as hot axillary sentinel lymph node #4. At this point background in the axilla was under 40, could not see any other blue dye or palpate any other nodes were localized discrete area of high counts. The deep and  subcutaneous tissues tissue was closed with interrupted 3-0 Vicryl. Skin incisions at both sites were closed with subcutaneous 5-0 Monocryl after infiltrating the skin  with local anesthesia. Sponge needle and Smith counts were correct. Dermabond was applied.    Findings: As above  Estimated Blood Loss:  Minimal         Drains: none  Blood Given: none          Specimens: #1 right breast lumpectomy   #2 hot blue axillary sentinel lymph node   #3 hot axillary sentinel lymph node   #4 hot blue axillary sentinel lymph node    #5 hot axillary sentinel lymph node        Complications:  * No complications entered in OR log *         Disposition: PACU - hemodynamically stable.         Condition: stable

## 2014-12-02 NOTE — H&P (Signed)
History of Present Illness Marilyn Dalton T. Marilyn Rana MD; 11/10/2014 3:33 PM) Patient words: discuss surgery.  The patient is a 74 year old female who presents with breast cancer. Patient returns for surgical planning following neoadjuvant chemotherapy for cancer of the upper outer right breast. Her initial presentation was as below:  She is a 74 year old menopausal female referred by Dr. Lajean Dalton for evaluation of recently diagnosed carcinoma of the right breast. About 2 months ago she noticed a lump in the upper outer right breast. She indicates about an inch in size. She subsequently saw her family physician and was referred to the breast center for further evaluation. Subsequent imaging included diagnostic mamogram showing very dense breast tissue but no discrete mass and ultrasound showing an ill-defined area of hypoechoic and heterogeneous tissue in the upper outer right breast which appeared abnormal but could not be very well characterized particularly in terms of size. An ultrasound guided breast biopsy was performed on June 20, 2014 with pathology revealing in situ and invasive ductal carcinoma of the breast. She is seen now in the office for initial treatment planning. She has experienced a lump in the right breast as noted but no nipple discharge or skin changes or nipple inversion. She does have a personal history of a benign lump in the left breast Findings at that time were the following:   Tumor size: Indeterminant but potentially extensive throughout the upper outer quadrant of the right breast Tumor grade: 2 Estrogen Receptor: Positive Progesterone Receptor: Positive Her-2 neu: Positive Lymph node status: Negative  Original MRI at the time of presentation did not show extremely extensive disease with the tumor measuring a maximum of 2.5 cm. She has undergone neoadjuvant chemotherapy which she tolerated with expected side effects.  Follow-up MRI has shown significant  reduction of clumped non-mass enhancement now measuring a maximum of 1.5 cm in the posterior upper outer right breast abutting the chest wall but apparently not involving it.    Allergies Marilyn Dalton, CMA; 11/10/2014 3:04 PM) No Known Drug Allergies05/07/2014  Medication History Marilyn Dalton, CMA; 11/10/2014 3:05 PM) Dexamethasone (4MG Tablet, Oral) Active. Colace (100MG Capsule, Oral) Active. LORazepam (0.5MG Tablet, Oral) Active. Compazine (10MG Tablet, Oral) Active. Lidocaine-Prilocaine (Bulk) (2.5-2.5% Cream,) Active. Medications Reconciled  Vitals (Marilyn Dalton CMA; 11/10/2014 3:04 PM) 11/10/2014 3:03 PM Weight: 110.6 lb Height: 65in Body Surface Area: 1.52 m Body Mass Index: 18.4 kg/m Temp.: 98.55F(Oral)  Pulse: 68 (Regular)  BP: 128/82 (Sitting, Left Arm, Standard)    Physical Exam Marilyn Dalton T. Marilyn Tino MD; 11/10/2014 3:32 PM) The physical exam findings are as follows: Note:General: Alert, thin Caucasian female, in no distress Skin: Warm and dry without rash or infection. HEENT: No palpable masses or thyromegaly. Sclera nonicteric. Pupils equal round and reactive. Oropharynx clear. Lymph nodes: No cervical, supraclavicular, or axillary lymph nodes palpable Breasts: Small a cup bilaterally. Some fullness upper-outer right breast but no distinct mass and there is some similar finding in the left breast. No skin changes. Lungs: Breath sounds clear and equal. No wheezing or increased work of breathing. Cardiovascular: Regular rate and rhythm without murmer. No JVD or edema. Peripheral pulses intact. No carotid bruits. Extremities: No edema or joint swelling or deformity. No chronic venous stasis changes. Neurologic: Alert and fully oriented. Gait normal. No focal weakness. Psychiatric: Normal mood and affect. Thought content appropriate with normal judgement and insight    Assessment & Plan Marilyn Dalton T. Marilyn Deshpande MD; 11/10/2014 3:44 PM) MALIGNANT  NEOPLASM OF UPPER-OUTER QUADRANT OF RIGHT  FEMALE BREAST (C50.411) Impression: 74 year old female with a new diagnosis of cancer of the right breast, upper outer quadrant. Clinical stage 1A, ER positive, PR positive, HER-2 positive. She has completed neoadjuvant chemotherapy with good response with the tumor shrinking from maximum of 2.4 cm to 1.5cm. At this point I feel she would be a reasonable candidate to proceed with breast conservation therapy with lumpectomy and sentinel lymph node biopsy. She is in agreement. We discussed the nature of surgery and recovery. We discussed risks of bleeding, infection, anesthetic risks, slight risk of lymphedema and possible need for further surgery based on final pathology. All her questions were answered. Current Plans  Schedule for Surgery Radioactive seed localized right breast lumpectomy and right axillary sentinel lymph node biopsy under general anesthesia as an outpatient Pt Education - [facility] Breast Biopsy HCI: discussed with patient and provided information.

## 2014-12-02 NOTE — Interval H&P Note (Signed)
History and Physical Interval Note:  12/02/2014 2:22 PM  Marilyn Dalton  has presented today for surgery, with the diagnosis of cancer right breast  The various methods of treatment have been discussed with the patient and family. After consideration of risks, benefits and other options for treatment, the patient has consented to  Procedure(s): RIGHT BREAST LUMPECTOMY WITH RADIOACTIVE SEED LOCALIZATION AND SENTINEL LYMPH NODE BIOPSY (Right) as a surgical intervention .  The patient's history has been reviewed, patient examined, no change in status, stable for surgery.  I have reviewed the patient's chart and labs.  Questions were answered to the patient's satisfaction.     Trude Cansler T

## 2014-12-02 NOTE — Transfer of Care (Signed)
Immediate Anesthesia Transfer of Care Note  Patient: Marilyn Dalton  Procedure(s) Performed: Procedure(s): RIGHT BREAST LUMPECTOMY WITH RADIOACTIVE SEED LOCALIZATION AND SENTINEL LYMPH NODE BIOPSY (Right)  Patient Location: PACU  Anesthesia Type:GA combined with regional for post-op pain  Level of Consciousness: awake and alert   Airway & Oxygen Therapy: Patient Spontanous Breathing and Patient connected to face mask oxygen  Post-op Assessment: Report given to RN and Post -op Vital signs reviewed and stable  Post vital signs: Reviewed and stable  Last Vitals:  Filed Vitals:   12/02/14 1603  BP: 135/83  Pulse:   Temp:   Resp:     Complications: No apparent anesthesia complications

## 2014-12-02 NOTE — Anesthesia Postprocedure Evaluation (Signed)
  Anesthesia Post-op Note  Patient: Marilyn Dalton  Procedure(s) Performed: Procedure(s): RIGHT BREAST LUMPECTOMY WITH RADIOACTIVE SEED LOCALIZATION AND SENTINEL LYMPH NODE BIOPSY (Right)  Patient Location: PACU  Anesthesia Type: General, Regional   Level of Consciousness: awake, alert  and oriented  Airway and Oxygen Therapy: Patient Spontanous Breathing  Post-op Pain: moderate  Post-op Assessment: Post-op Vital signs reviewed  Post-op Vital Signs: Reviewed  Last Vitals:  Filed Vitals:   12/02/14 1630  BP: 155/85  Pulse: 72  Temp:   Resp: 19    Complications: No apparent anesthesia complications

## 2014-12-02 NOTE — Anesthesia Preprocedure Evaluation (Addendum)
Anesthesia Evaluation  Patient identified by MRN, date of birth, ID band Patient awake    Reviewed: Allergy & Precautions, NPO status , Patient's Chart, lab work & pertinent test results  Airway Mallampati: II  TM Distance: >3 FB Neck ROM: Full    Dental   Pulmonary Current Smoker,    breath sounds clear to auscultation       Cardiovascular negative cardio ROS   Rhythm:Regular Rate:Normal     Neuro/Psych negative neurological ROS     GI/Hepatic Neg liver ROS, GERD  ,  Endo/Other  negative endocrine ROS  Renal/GU negative Renal ROS     Musculoskeletal   Abdominal   Peds  Hematology negative hematology ROS (+)   Anesthesia Other Findings   Reproductive/Obstetrics                            Anesthesia Physical Anesthesia Plan  ASA: II  Anesthesia Plan: General and Regional   Post-op Pain Management: GA combined w/ Regional for post-op pain   Induction: Intravenous  Airway Management Planned: LMA  Additional Equipment:   Intra-op Plan:   Post-operative Plan:   Informed Consent: I have reviewed the patients History and Physical, chart, labs and discussed the procedure including the risks, benefits and alternatives for the proposed anesthesia with the patient or authorized representative who has indicated his/her understanding and acceptance.     Plan Discussed with: CRNA  Anesthesia Plan Comments:         Anesthesia Quick Evaluation

## 2014-12-02 NOTE — Discharge Instructions (Signed)
[facility] °Office Phone Number [phone] ° °BREAST BIOPSY/ LUMPECTOMY: POST OP INSTRUCTIONS ° °Always review your discharge instruction sheet given to you by the facility where your surgery was performed. ° °IF YOU HAVE DISABILITY OR FAMILY LEAVE FORMS, YOU MUST BRING THEM TO THE OFFICE FOR PROCESSING.  DO NOT GIVE THEM TO YOUR DOCTOR. ° °1. A prescription for pain medication may be given to you upon discharge.  Take your pain medication as prescribed, if needed.  If narcotic pain medicine is not needed, then you may take acetaminophen (Tylenol) or ibuprofen (Advil) as needed. °2. Take your usually prescribed medications unless otherwise directed °3. If you need a refill on your pain medication, please contact your pharmacy.  They will contact our office to request authorization.  Prescriptions will not be filled after 5pm or on week-ends. °4. You should eat very light the first 24 hours after surgery, such as soup, crackers, pudding, etc.  Resume your normal diet the day after surgery. °5. Most patients will experience some swelling and bruising in the breast.  Ice packs and a good support bra will help.  Swelling and bruising can take several days to resolve.  °6. It is common to experience some constipation if taking pain medication after surgery.  Increasing fluid intake and taking a stool softener will usually help or prevent this problem from occurring.  A mild laxative (Milk of Magnesia or Miralax) should be taken according to package directions if there are no bowel movements after 48 hours. °7. Unless discharge instructions indicate otherwise, you may remove your bandages 24-48 hours after surgery, and you may shower at that time.  You may have steri-strips (small skin tapes) in place directly over the incision.  These strips should be left on the skin for 7-10 days.  If your surgeon used skin glue on the incision, you may shower in 24 hours.  The glue will flake off over the next 2-3  weeks.  Any sutures or staples will be removed at the office during your follow-up visit. °8. ACTIVITIES:  You may resume regular daily activities (gradually increasing) beginning the next day.  Wearing a good support bra or sports bra minimizes pain and swelling.  You may have sexual intercourse when it is comfortable. °a. You may drive when you no longer are taking prescription pain medication, you can comfortably wear a seatbelt, and you can safely maneuver your car and apply brakes. °b. RETURN TO WORK:  ______________________________________________________________________________________ °9. You should see your doctor in the office for a follow-up appointment approximately two weeks after your surgery.  Your doctor’s nurse will typically make your follow-up appointment when she calls you with your pathology report.  Expect your pathology report 2-3 business days after your surgery.  You may call to check if you do not hear from us after three days. °10. OTHER INSTRUCTIONS: _______________________________________________________________________________________________ _____________________________________________________________________________________________________________________________________ °_____________________________________________________________________________________________________________________________________ °_____________________________________________________________________________________________________________________________________ ° °WHEN TO CALL YOUR DOCTOR: °1. Fever over 101.0 °2. Nausea and/or vomiting. °3. Extreme swelling or bruising. °4. Continued bleeding from incision. °5. Increased pain, redness, or drainage from the incision. ° °The clinic staff is available to answer your questions during regular business hours.  Please don’t hesitate to call and ask to speak to one of the nurses for clinical concerns.  If you have a medical emergency, go to the nearest emergency  room or call 911.  A surgeon from [facility] is always on call at the hospital. ° °For further questions, please visit [email or web address]  ° °  Post Anesthesia Home Care Instructions ° °Activity: °Get plenty of rest for the remainder of the day. A responsible adult should stay with you for 24 hours following the procedure.  °For the next 24 hours, DO NOT: °-Drive a car °-Operate machinery °-Drink alcoholic beverages °-Take any medication unless instructed by your physician °-Make any legal decisions or sign important papers. ° °Meals: °Start with liquid foods such as gelatin or soup. Progress to regular foods as tolerated. Avoid greasy, spicy, heavy foods. If nausea and/or vomiting occur, drink only clear liquids until the nausea and/or vomiting subsides. Call your physician if vomiting continues. ° °Special Instructions/Symptoms: °Your throat may feel dry or sore from the anesthesia or the breathing tube placed in your throat during surgery. If this causes discomfort, gargle with warm salt water. The discomfort should disappear within 24 hours. ° °If you had a scopolamine patch placed behind your ear for the management of post- operative nausea and/or vomiting: ° °1. The medication in the patch is effective for 72 hours, after which it should be removed.  Wrap patch in a tissue and discard in the trash. Wash hands thoroughly with soap and water. °2. You may remove the patch earlier than 72 hours if you experience unpleasant side effects which may include dry mouth, dizziness or visual disturbances. °3. Avoid touching the patch. Wash your hands with soap and water after contact with the patch. °  ° °

## 2014-12-02 NOTE — Anesthesia Procedure Notes (Addendum)
Anesthesia Regional Block:  Pectoralis block  Pre-Anesthetic Checklist: ,, timeout performed, Correct Patient, Correct Site, Correct Laterality, Correct Procedure, Correct Position, site marked, Risks and benefits discussed,  Surgical consent,  Pre-op evaluation,  At surgeon's request and post-op pain management  Laterality: Right  Prep: chloraprep       Needles:  Injection technique: Single-shot  Needle Type: Echogenic Needle     Needle Length: 9cm 9 cm Needle Gauge: 21 and 21 G    Additional Needles:  Procedures: ultrasound guided (picture in chart) Pectoralis block Narrative:  Start time: 12/02/2014 1:20 PM End time: 12/02/2014 1:28 PM Injection made incrementally with aspirations every 5 mL.  Performed by: Personally  Anesthesiologist: Suzette Battiest  Additional Notes: Risks and benefits discussed. Pt tolerated well with no immediate complications.   Procedure Name: LMA Insertion Date/Time: 12/02/2014 2:37 PM Performed by: Lieutenant Diego Pre-anesthesia Checklist: Patient identified, Emergency Drugs available, Suction available and Patient being monitored Patient Re-evaluated:Patient Re-evaluated prior to inductionOxygen Delivery Method: Circle System Utilized Preoxygenation: Pre-oxygenation with 100% oxygen Intubation Type: IV induction Ventilation: Mask ventilation without difficulty LMA: LMA inserted LMA Size: 3.0 Number of attempts: 1 Airway Equipment and Method: Bite block Placement Confirmation: positive ETCO2 and breath sounds checked- equal and bilateral Tube secured with: Tape Dental Injury: Teeth and Oropharynx as per pre-operative assessment

## 2014-12-02 NOTE — Progress Notes (Signed)
Nuc med injection performed by radiology staff. Pt tol well with fentanyl and versed. VSS. Emotional support provided.

## 2014-12-03 LAB — POCT HEMOGLOBIN-HEMACUE: Hemoglobin: 12.2 g/dL (ref 12.0–15.0)

## 2014-12-04 ENCOUNTER — Encounter (HOSPITAL_BASED_OUTPATIENT_CLINIC_OR_DEPARTMENT_OTHER): Payer: Self-pay | Admitting: General Surgery

## 2014-12-09 ENCOUNTER — Ambulatory Visit (HOSPITAL_BASED_OUTPATIENT_CLINIC_OR_DEPARTMENT_OTHER): Payer: Medicare Other

## 2014-12-09 ENCOUNTER — Ambulatory Visit (HOSPITAL_BASED_OUTPATIENT_CLINIC_OR_DEPARTMENT_OTHER): Payer: Medicare Other | Admitting: Hematology and Oncology

## 2014-12-09 ENCOUNTER — Other Ambulatory Visit: Payer: Medicare Other

## 2014-12-09 ENCOUNTER — Encounter: Payer: Self-pay | Admitting: Hematology and Oncology

## 2014-12-09 VITALS — BP 149/77 | HR 97 | Temp 97.5°F | Resp 17 | Ht 65.0 in | Wt 112.0 lb

## 2014-12-09 DIAGNOSIS — C50211 Malignant neoplasm of upper-inner quadrant of right female breast: Secondary | ICD-10-CM

## 2014-12-09 DIAGNOSIS — C50811 Malignant neoplasm of overlapping sites of right female breast: Secondary | ICD-10-CM | POA: Diagnosis present

## 2014-12-09 DIAGNOSIS — Z5112 Encounter for antineoplastic immunotherapy: Secondary | ICD-10-CM

## 2014-12-09 MED ORDER — ACETAMINOPHEN 325 MG PO TABS
650.0000 mg | ORAL_TABLET | Freq: Once | ORAL | Status: AC
  Administered 2014-12-09: 650 mg via ORAL

## 2014-12-09 MED ORDER — TRASTUZUMAB CHEMO INJECTION 440 MG
6.0000 mg/kg | Freq: Once | INTRAVENOUS | Status: AC
  Administered 2014-12-09: 294 mg via INTRAVENOUS
  Filled 2014-12-09: qty 14

## 2014-12-09 MED ORDER — HEPARIN SOD (PORK) LOCK FLUSH 100 UNIT/ML IV SOLN
500.0000 [IU] | Freq: Once | INTRAVENOUS | Status: AC | PRN
  Administered 2014-12-09: 500 [IU]
  Filled 2014-12-09: qty 5

## 2014-12-09 MED ORDER — DIPHENHYDRAMINE HCL 25 MG PO CAPS
ORAL_CAPSULE | ORAL | Status: AC
  Filled 2014-12-09: qty 2

## 2014-12-09 MED ORDER — SODIUM CHLORIDE 0.9 % IV SOLN
Freq: Once | INTRAVENOUS | Status: AC
  Administered 2014-12-09: 13:00:00 via INTRAVENOUS

## 2014-12-09 MED ORDER — ACETAMINOPHEN 325 MG PO TABS
ORAL_TABLET | ORAL | Status: AC
  Filled 2014-12-09: qty 2

## 2014-12-09 MED ORDER — SODIUM CHLORIDE 0.9 % IJ SOLN
10.0000 mL | INTRAMUSCULAR | Status: DC | PRN
  Administered 2014-12-09: 10 mL
  Filled 2014-12-09: qty 10

## 2014-12-09 MED ORDER — DIPHENHYDRAMINE HCL 25 MG PO CAPS
50.0000 mg | ORAL_CAPSULE | Freq: Once | ORAL | Status: AC
  Administered 2014-12-09: 50 mg via ORAL

## 2014-12-09 NOTE — Patient Instructions (Signed)
[facility] Discharge Instructions for Patients Receiving Chemotherapy  Today you received the following chemotherapy agents herceptin  To help prevent nausea and vomiting after your treatment, we encourage you to take your nausea medication if needed   If you develop nausea and vomiting that is not controlled by your nausea medication, call the clinic.   BELOW ARE SYMPTOMS THAT SHOULD BE REPORTED IMMEDIATELY:  *FEVER GREATER THAN 100.5 F  *CHILLS WITH OR WITHOUT FEVER  NAUSEA AND VOMITING THAT IS NOT CONTROLLED WITH YOUR NAUSEA MEDICATION  *UNUSUAL SHORTNESS OF BREATH  *UNUSUAL BRUISING OR BLEEDING  TENDERNESS IN MOUTH AND THROAT WITH OR WITHOUT PRESENCE OF ULCERS  *URINARY PROBLEMS  *BOWEL PROBLEMS  UNUSUAL RASH Items with * indicate a potential emergency and should be followed up as soon as possible.  Feel free to call the clinic you have any questions or concerns. The clinic phone number is [phone].  Please show the CHEMO ALERT CARD at check-in to the Emergency Department and triage nurse.  Trastuzumab injection for infusion What is this medicine? TRASTUZUMAB (tras TOO zoo mab) is a monoclonal antibody. It is used to treat breast cancer and stomach cancer. This medicine may be used for other purposes; ask your health care provider or pharmacist if you have questions. What should I tell my health care provider before I take this medicine? They need to know if you have any of these conditions: -heart disease -heart failure -infection (especially a virus infection such as chickenpox, cold sores, or herpes) -lung or breathing disease, like asthma -recent or ongoing radiation therapy -an unusual or allergic reaction to trastuzumab, benzyl alcohol, or other medications, foods, dyes, or preservatives -pregnant or trying to get pregnant -breast-feeding How should I use this medicine? This drug is given as an infusion into a vein. It is administered in  a hospital or clinic by a specially trained health care professional. Talk to your pediatrician regarding the use of this medicine in children. This medicine is not approved for use in children. Overdosage: If you think you have taken too much of this medicine contact a poison control center or emergency room at once. NOTE: This medicine is only for you. Do not share this medicine with others. What if I miss a dose? It is important not to miss a dose. Call your doctor or health care professional if you are unable to keep an appointment. What may interact with this medicine? -doxorubicin -warfarin This list may not describe all possible interactions. Give your health care provider a list of all the medicines, herbs, non-prescription drugs, or dietary supplements you use. Also tell them if you smoke, drink alcohol, or use illegal drugs. Some items may interact with your medicine. What should I watch for while using this medicine? Visit your doctor for checks on your progress. Report any side effects. Continue your course of treatment even though you feel ill unless your doctor tells you to stop. Call your doctor or health care professional for advice if you get a fever, chills or sore throat, or other symptoms of a cold or flu. Do not treat yourself. Try to avoid being around people who are sick. You may experience fever, chills and shaking during your first infusion. These effects are usually mild and can be treated with other medicines. Report any side effects during the infusion to your health care professional. Fever and chills usually do not happen with later infusions. Do not become pregnant while taking this medicine or for   7 months after stopping it. Women should inform their doctor if they wish to become pregnant or think they might be pregnant. Women of child-bearing potential will need to have a negative pregnancy test before starting this medicine. There is a potential for serious side  effects to an unborn child. Talk to your health care professional or pharmacist for more information. Do not breast-feed an infant while taking this medicine or for 7 months after stopping it. Women must use effective birth control with this medicine. What side effects may I notice from receiving this medicine? Side effects that you should report to your doctor or other health care professional as soon as possible: -breathing difficulties -chest pain or palpitations -cough -dizziness or fainting -fever or chills, sore throat -skin rash, itching or hives -swelling of the legs or ankles -unusually weak or tired Side effects that usually do not require medical attention (report to your doctor or other health care professional if they continue or are bothersome): -loss of appetite -headache -muscle aches -nausea This list may not describe all possible side effects. Call your doctor for medical advice about side effects. You may report side effects to FDA at 1-800-FDA-1088. Where should I keep my medicine? This drug is given in a hospital or clinic and will not be stored at home. NOTE: This sheet is a summary. It may not cover all possible information. If you have questions about this medicine, talk to your doctor, pharmacist, or health care provider.    2016, [employer] Standard. (2014-05-16 11:49:32)  

## 2014-12-09 NOTE — Addendum Note (Signed)
Addended by: Prentiss Bells on: 12/09/2014 02:03 PM   Modules accepted: Medications

## 2014-12-09 NOTE — Assessment & Plan Note (Signed)
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, 2.4 x 2.2 x 2.2 cm mass which abuts the pectoral muscle, grade 2, invasive ductal carcinoma, ER 90%, PR 1%, Ki-67 20%, HER-2 positive ratio 2.97, T2 N0 M0 stage II A clinical stage Status post TCH Perjeta 6 cycles started 07/14/2014 completed 10/28/2014 Right lumpectomy 12/02/2014: Residual IDC grade 3; 2.4 cm with DCIS high-grade, posterior margin probably positive for invasive cancer, 1/4 lymph nodes positive with extracapsular extension, T2 N1 stage IIB  Pathology review: I discussed the final pathology report including the positive posterior margin which happens to be the pectoralis fascia. There is no additional role of surgery. I also discussed the positive lymph node with extracapsular extension.  Recommendations: 1. Adjuvant radiation therapy 2. Followed by adjuvant antiestrogen therapy with aromatase inhibitors 3. Continue every 3 week Herceptin for 1 year to be completed May 2017  Return to clinic every 6 weeks for follow-up on Herceptin.

## 2014-12-09 NOTE — Progress Notes (Signed)
Patient Care Team: [name], MD as PCP - General (Family Medicine)  DIAGNOSIS: No matching staging information was found for the patient.  SUMMARY OF ONCOLOGIC HISTORY:   Breast cancer of upper-inner quadrant of right female breast ([place])   06/20/2014 Initial Diagnosis right breast 3:00: Invasive ductal carcinoma with DCIS, grade 2,ER 90%, PR 1%,HER-2 positive ratio 2.97   06/30/2014 Breast MRI Right breast 2.4 x 2.2 x 2.2 cm irregular mass, no abnormal lymph nodes   07/14/2014 - 10/28/2014 Neo-Adjuvant Chemotherapy TCH Perjeta every 3 weeks 6 followed by Herceptin maintenance   11/06/2014 Breast MRI Right breast mass has significantly decreased in size and is less masslike, maximal enhancement is 1.5 cm previously 2.4 cm   12/02/2014 Surgery Right lumpectomy: Residual IDC grade 3; 2.4 cm with DCIS high-grade, posterior margin probably positive for invasive cancer, 1/4 lymph nodes positive with extracapsular extension, T2 N1 stage IIB    CHIEF COMPLIANT: Patient is here to discuss the result of surgery  INTERVAL HISTORY: Marilyn Dalton is a 74 year old with above-mentioned history of HER-2 and ER positive breast cancer who received neoadjuvant chemotherapy with TCH Perjeta. She underwent lumpectomy and is here today to discuss the pathology report. The final report suggested that she still has significant residual cancer and one out of 4 lymph nodes was positive. There was extracapsular extension. The posterior margin was positive however that was the chest wall. She is recovered extremely well from surgery standpoint. She does not have any major discomfort. She'll of course extremely worried and anxious about the involved lymph node.  REVIEW OF SYSTEMS:   Constitutional: Denies fevers, chills or abnormal weight loss Eyes: Denies blurriness of vision Ears, nose, mouth, throat, and face: Denies mucositis or sore throat Respiratory: Denies cough, dyspnea or wheezes Cardiovascular: Denies  palpitation, chest discomfort or lower extremity swelling Gastrointestinal:  Denies nausea, heartburn or change in bowel habits Skin: Denies abnormal skin rashes Lymphatics: Denies new lymphadenopathy or easy bruising Neurological:Denies numbness, tingling or new weaknesses Behavioral/Psych: Mood is stable, no new changes  Breast: Some soreness in the breast from recent surgery All other systems were reviewed with the patient and are negative.  I have reviewed the past medical history, past surgical history, social history and family history with the patient and they are unchanged from previous note.  ALLERGIES:  has No Known Allergies.  MEDICATIONS:  Current Outpatient Prescriptions  Medication Sig Dispense Refill  . dexamethasone (DECADRON) 4 MG tablet Take 1 tablet (4 mg total) by mouth 2 (two) times daily. Start the day before Taxotere. Then again the day after chemo for 3 days. 30 tablet 1  . docusate sodium (COLACE) 100 MG capsule Take 100 mg by mouth 3 (three) times daily as needed for mild constipation (constipation).    [name] HYDROcodone-acetaminophen (NORCO/VICODIN) 5-325 MG per tablet Take 1-2 tablets by mouth every 4 (four) hours as needed for moderate pain or severe pain. 25 tablet 0  . HYDROcodone-acetaminophen (NORCO/VICODIN) 5-325 MG tablet Take 1-2 tablets by mouth every 4 (four) hours as needed for moderate pain or severe pain. 30 tablet 0  . lidocaine-prilocaine (EMLA) cream Apply to affected area once 30 g 3  . LORazepam (ATIVAN) 0.5 MG tablet Take 1 tablet (0.5 mg total) by mouth at bedtime. 30 tablet 1  . mineral oil enema Place 133 mLs (1 enema total) rectally once. 266 mL 0  . ondansetron (ZOFRAN) 8 MG tablet Take 1 tablet (8 mg total) by mouth 2 (two) times daily.  Start the day after chemo for 3 days. Then take as needed for nausea or vomiting. 30 tablet 1  . prochlorperazine (COMPAZINE) 10 MG tablet Take 1 tablet (10 mg total) by mouth every 6 (six) hours as needed  (Nausea or vomiting). 30 tablet 1  . senna (SENOKOT) 8.6 MG TABS tablet Take 1 tablet by mouth.     No current facility-administered medications for this visit.    PHYSICAL EXAMINATION: ECOG PERFORMANCE STATUS: 1 - Symptomatic but completely ambulatory  Filed Vitals:   12/09/14 1054  BP: 149/77  Pulse: 97  Temp: 97.5 F (36.4 C)  Resp: 17   Filed Weights   12/09/14 1054  Weight: 112 lb (50.803 kg)    GENERAL:alert, no distress and comfortable SKIN: skin color, texture, turgor are normal, no rashes or significant lesions EYES: normal, Conjunctiva are pink and non-injected, sclera clear OROPHARYNX:no exudate, no erythema and lips, buccal mucosa, and tongue normal  NECK: supple, thyroid normal size, non-tender, without nodularity LYMPH:  no palpable lymphadenopathy in the cervical, axillary or inguinal LUNGS: clear to auscultation and percussion with normal breathing effort HEART: regular rate & rhythm and no murmurs and no lower extremity edema ABDOMEN:abdomen soft, non-tender and normal bowel sounds Musculoskeletal:no cyanosis of digits and no clubbing  NEURO: alert & oriented x 3 with fluent speech, no focal motor/sensory deficits   LABORATORY DATA:  I have reviewed the data as listed   Chemistry      Component Value Date/Time   NA 144 11/18/2014 0751   NA 140 07/18/2014 0151   K 4.6 11/18/2014 0751   K 3.6 07/18/2014 0151   CL 102 07/18/2014 0151   CO2 26 11/18/2014 0751   CO2 27 07/10/2014 1120   BUN 6.7* 11/18/2014 0751   BUN 19 07/18/2014 0151   CREATININE 0.7 11/18/2014 0751   CREATININE 0.70 07/18/2014 0151      Component Value Date/Time   CALCIUM 9.2 11/18/2014 0751   CALCIUM 10.1 07/10/2014 1120   ALKPHOS 89 11/18/2014 0751   ALKPHOS 70 07/10/2014 1120   AST 19 11/18/2014 0751   AST 25 07/10/2014 1120   ALT 14 11/18/2014 0751   ALT 19 07/10/2014 1120   BILITOT <0.30 11/18/2014 0751   BILITOT 0.5 07/10/2014 1120       Lab Results  Component  Value Date   WBC 3.6* 11/18/2014   HGB 12.2 12/02/2014   HCT 32.5* 11/18/2014   MCV 109.0* 11/18/2014   PLT 148 11/18/2014   NEUTROABS 1.7 11/18/2014   ASSESSMENT & PLAN:  Breast cancer of upper-inner quadrant of right female 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, 2.4 x 2.2 x 2.2 cm mass which abuts the pectoral muscle, grade 2, invasive ductal carcinoma, ER 90%, PR 1%, Ki-67 20%, HER-2 positive ratio 2.97, T2 N0 M0 stage II A clinical stage Status post TCH Perjeta 6 cycles started 07/14/2014 completed 10/28/2014 Right lumpectomy 12/02/2014: Residual IDC grade 3; 2.4 cm with DCIS high-grade, posterior margin probably positive for invasive cancer, 1/4 lymph nodes positive with extracapsular extension, T2 N1 stage IIB  Pathology review: I discussed the final pathology report including the positive posterior margin which happens to be the pectoralis fascia. There is no additional role of surgery. I also discussed the positive lymph node with extracapsular extension. Patient is very disappointed to hear that the chemotherapy did not shrink the cancer. Also very concerned about the positive lymph node. I discussed with her that radiation will help  with preventing local recurrences both in the breast and axilla.  Recommendations: 1. Adjuvant radiation therapy 2. Followed by adjuvant antiestrogen therapy with aromatase inhibitors 3. Continue every 3 week Herceptin for 1 year to be completed May 2017  Return to clinic every 6 weeks for follow-up on Herceptin.   No orders of the defined types were placed in this encounter.   The patient has a good understanding of the overall plan. she agrees with it. she will call with any problems that may develop before the next visit here.   Rulon Eisenmenger, MD 12/09/2014

## 2014-12-11 NOTE — Progress Notes (Signed)
Follow Up New Consult  Right Breast Cancer   12/09/14 Dr. 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   06/30/2014 Breast MRI Right breast 2.4 x 2.2 x 2.2 cm irregular mass, no abnormal lymph nodes   07/14/2014 - 10/28/2014 Neo-Adjuvant Chemotherapy TCH Perjeta every 3 weeks 6 followed by Herceptin maintenance   11/06/2014 Breast MRI Right breast mass has significantly decreased in size and is less masslike, maximal enhancement is 1.5 cm previously 2.4 cm   12/02/2014 Surgery Right lumpectomy: Residual IDC grade 3; 2.4 cm with DCIS high-grade, posterior margin probably positive for invasive cancer, 1/4 lymph nodes positive with extracapsular extension, T2 N1 stage IIB       Recommendations: 1. Adjuvant radiation therapy 2. Followed by adjuvant antiestrogen therapy with aromatase inhibitors 3. Continue every 3 week Herceptin for 1 year to be completed May 2017 next Herceptin scheduled 12/30/14, and follow up with Dr. Lindi Adie 01/20/15 with labs  Diagnosis 12/02/14:  Dr. [name], MD,   1. Breast, lumpectomy, Right - RESIDUAL INVASIVE DUCTAL CARCINOMA, GRADE 3, SPANNING 2.4 CM.- DUCTAL CARCINOMA IN SITU, HIGH GRADE.- POSTERIOR MARGIN IS BROADLY POSITIVE FOR INVASIVE CARCINOMA.- DUCTAL CARCINOMA IN SITU COMES TO WITHIN 0.1-0.2 CM OF THE POSTERIOR MARGIN FOCALLY.- BIOPSY SITE.- SEE ONCOLOGY TABLE. 2. Lymph node, sentinel, biopsy, right axillary #1 - ONE OF ONE LYMPH NODES NEGATIVE FOR CARCINOMA (0/1). 3. Lymph node, sentinel, biopsy, right axillary #2 - ONE OF ONE LYMPH NODES NEGATIVE FOR CARCINOMA (0/1). 4. Lymph node, sentinel, biopsy, right axillary #3 - ONE OF ONE LYMPH NODES NEGATIVE FOR CARCINOMA (0/1). 5. Lymph node, sentinel, biopsy, right axillary #4 - METASTATIC CARCINOMA IN ONE OF ONE LYMPH  NODES (1/1). - EXTRACAPSULAR EXTENSION PRESENT.   Incision under axilla well healed, incision around nipple, some pinkness , slight numbness there, , swelling has gone down stated patient, some nausea still taking compazine prn  energy level  Comes and goes,  10:06 AM BP 144/66 mmHg  Pulse 84  Temp(Src) 97.6 F (36.4 C) (Oral)  Resp 16  Ht 5' 5"  (1.651 m)  Wt 108 lb 6.4 oz (49.17 kg)  BMI 18.04 kg/m2  Wt Readings from Last 3 Encounters:  12/15/14 108 lb 6.4 oz (49.17 kg)  12/09/14 112 lb (50.803 kg)  12/02/14 116 lb (52.617 kg)

## 2014-12-15 ENCOUNTER — Ambulatory Visit
Admission: RE | Admit: 2014-12-15 | Discharge: 2014-12-15 | Disposition: A | Discharge status: Home / Self Care | Payer: Medicare Other | Source: Ambulatory Visit | Attending: Radiation Oncology | Admitting: Radiation Oncology

## 2014-12-15 ENCOUNTER — Encounter: Payer: Self-pay | Admitting: Radiation Oncology

## 2014-12-15 VITALS — BP 144/66 | HR 84 | Temp 97.6°F | Resp 16 | Ht 65.0 in | Wt 108.4 lb

## 2014-12-15 DIAGNOSIS — C50911 Malignant neoplasm of unspecified site of right female breast: Secondary | ICD-10-CM | POA: Diagnosis present

## 2014-12-15 DIAGNOSIS — Z79899 Other long term (current) drug therapy: Secondary | ICD-10-CM | POA: Insufficient documentation

## 2014-12-15 DIAGNOSIS — C50211 Malignant neoplasm of upper-inner quadrant of right female breast: Secondary | ICD-10-CM

## 2014-12-15 NOTE — Progress Notes (Signed)
Please see the Nurse Progress Note in the MD Initial Consult Encounter for this patient. 

## 2014-12-15 NOTE — Progress Notes (Signed)
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     Narrative:  The patient returns today for routine follow-up.  The patient was originally seen in multidisciplinary clinic and was felt to be a good candidate for breast conservation treatment.    The patient underwent right lumpectomy on 12/02/14. In regards to the posterior margin, [name] states: " We knew this was against the chest wall and I took the fascia off of muscle at the time of surgery. There did not appear to be any invasion deep to the fascia and so I do not believe any further surgery is necessary". There was however, 1/4 lymph node positive for invasive cancer.    The patient states she is doing well post-surgery.     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   06/30/2014 Breast MRI Right breast 2.4 x 2.2 x 2.2 cm irregular mass, no abnormal lymph nodes   07/14/2014 - 10/28/2014 Neo-Adjuvant Chemotherapy TCH Perjeta every 3 weeks 6 followed by Herceptin maintenance   11/06/2014 Breast MRI Right breast mass has significantly decreased in size and is less masslike, maximal enhancement is 1.5 cm previously 2.4 cm   12/02/2014 Surgery Right lumpectomy: Residual IDC grade 3; 2.4 cm with DCIS high-grade, posterior margin probably positive for invasive cancer, 1/4 lymph nodes positive with extracapsular extension, T2 N1 stage IIB    ALLERGIES:  has No Known Allergies.  Meds: Current Outpatient Prescriptions  Medication Sig Dispense Refill  . lidocaine-prilocaine (EMLA) cream  Apply to affected area once 30 g 3  . prochlorperazine (COMPAZINE) 10 MG tablet Take 1 tablet (10 mg total) by mouth every 6 (six) hours as needed (Nausea or vomiting). 30 tablet 1  . senna (SENOKOT) 8.6 MG TABS tablet Take 1 tablet by mouth.    . dexamethasone (DECADRON) 4 MG tablet Take 1 tablet (4 mg total) by mouth 2 (two) times daily. Start the day before Taxotere. Then again the day after chemo for 3 days. (Patient not taking: Reported on 12/15/2014) 30 tablet 1  . docusate sodium (COLACE) 100 MG capsule Take 100 mg by mouth 3 (three) times daily as needed for mild constipation (constipation).    [name] HYDROcodone-acetaminophen (NORCO/VICODIN) 5-325 MG per tablet Take 1-2 tablets by mouth every 4 (four) hours as needed for moderate pain or severe pain. (Patient not taking: Reported on 12/15/2014) 25 tablet 0  . HYDROcodone-acetaminophen (NORCO/VICODIN) 5-325 MG tablet Take 1-2 tablets by mouth every 4 (four) hours as needed for moderate pain or severe pain. (Patient not taking: Reported on 12/15/2014) 30 tablet 0  . LORazepam (ATIVAN) 0.5 MG tablet Take 1 tablet (0.5 mg total) by mouth at bedtime. (Patient not taking: Reported on 12/15/2014) 30 tablet 1  . mineral oil enema Place 133 mLs (1 enema total) rectally once. (Patient not taking: Reported on 12/15/2014) 266 mL 0  . ondansetron (ZOFRAN) 8 MG tablet Take 1 tablet (8 mg total) by mouth 2 (two) times daily. Start the day after chemo for 3 days. Then take as needed for nausea or vomiting. (Patient not  taking: Reported on 12/15/2014) 30 tablet 1   No current facility-administered medications for this encounter.    Physical Findings: The patient is in no acute distress. Patient is alert and oriented.  height is _0  (1.651 m) and weight is 108 lb 6.4 oz (49.17 kg). Her oral temperature is 97.6 F (36.4 C). Her blood pressure is 144/66 and her pulse is 84. Her respiration is 16. .   General: Well-developed, in no acute distress HEENT:  Normocephalic, atraumatic Cardiovascular: Regular rate and rhythm Respiratory: Clear to auscultation bilaterally GI: Soft, nontender, normal bowel sounds Extremities: No edema present Breast: Two very well healing incisions in right breast and right axilla , no sign of infection.   Lab Findings: Lab Results  Component Value Date   WBC 3.6* 11/18/2014   HGB 12.2 12/02/2014   HCT 32.5* 11/18/2014   MCV 109.0* 11/18/2014   PLT 148 11/18/2014     Radiographic Findings: Nm Sentinel Node Inj-no Rpt (breast)  12/02/2014  CLINICAL DATA: cancer right breast Sulfur colloid was injected intradermally by the nuclear medicine technologist for breast cancer sentinel node localization.   Mm Breast Surgical Specimen  12/02/2014  CLINICAL DATA:  Right lumpectomy for invasive ductal carcinoma and ductal carcinoma in situ. EXAM: SPECIMEN RADIOGRAPH OF THE RIGHT BREAST COMPARISON:  Previous exam(s). FINDINGS: Status post excision of the right breast. The radioactive seed and biopsy marker clip are present, completely intact, and were marked for pathology. IMPRESSION: Specimen radiograph of the right breast. Electronically Signed   By: Claudie Revering M.D.   On: 12/02/2014 15:25   Mm Rt Radioactive Seed Loc Mammo Guide  12/01/2014  CLINICAL DATA:  Status post neoadjuvant chemotherapy for right breast invasive ductal carcinoma and ductal carcinoma in situ. Preoperative seed localization. EXAM: MAMMOGRAPHIC GUIDED RADIOACTIVE SEED LOCALIZATION OF THE RIGHT BREAST COMPARISON:  Previous exam(s). FINDINGS: Patient presents for radioactive seed localization prior to right lumpectomy. I met with the patient and we discussed the procedure of seed localization including benefits and alternatives. We discussed the high likelihood of a successful procedure. We discussed the risks of the procedure including infection, bleeding, tissue injury and further surgery. We discussed the low dose of radioactivity involved in the  procedure. Informed, written consent was given. The usual time-out protocol was performed immediately prior to the procedure. Using mammographic guidance, sterile technique, 2% lidocaine and an I-125 radioactive seed, the previously placed coil shaped biopsy marker clip in the upper-outer quadrant of the right breast was localized using a lateral approach. The follow-up mammogram images confirm the seed in the expected location and were marked for Dr. [name]. Follow-up survey of the patient confirms presence of the radioactive seed. Order number of I-125 seed:  [id number]. Total activity:  0.251 mCi  Reference Date: 11/26/2014 The patient tolerated the procedure well and was released from the [facility]. She was given instructions regarding seed removal. IMPRESSION: Radioactive seed localization right breast. No apparent complications. Electronically Signed   By: Claudie Revering M.D.   On: 12/01/2014 14:56   Impression:  The patient is recovering well from her right breast lumpectomy. The patient is ready to proceed with radiation treatment.   Plan:  We discussed the possible side effects and risks of treatment in addition to the possible benefits of treatment. We discussed the protocol for radiation treatment.  All of the patient's questions were answered. The patient does wish to proceed with this treatment. A simulation will be scheduled such that we can proceed with treatment  planning. I anticipate a 4 week course of treatment.   Patient sees [name] on Friday.   ----------------------------------------------  Jodelle Gross, MD, PhD  This document serves as a record of services personally performed by Kyung Rudd, MD. It was created on his behalf by Derek Mound, a trained medical scribe. The creation of this record is based on the scribe's personal observations and the provider's statements to them. This document has been checked and approved by the attending provider.

## 2014-12-17 ENCOUNTER — Ambulatory Visit: Payer: Medicare Other

## 2014-12-17 ENCOUNTER — Other Ambulatory Visit: Payer: Medicare Other

## 2014-12-19 ENCOUNTER — Ambulatory Visit
Admission: RE | Admit: 2014-12-19 | Discharge: 2014-12-19 | Disposition: A | Discharge status: Home / Self Care | Payer: Medicare Other | Source: Ambulatory Visit | Attending: Radiation Oncology | Admitting: Radiation Oncology

## 2014-12-19 DIAGNOSIS — C50211 Malignant neoplasm of upper-inner quadrant of right female breast: Secondary | ICD-10-CM

## 2014-12-19 DIAGNOSIS — C50911 Malignant neoplasm of unspecified site of right female breast: Secondary | ICD-10-CM | POA: Diagnosis not present

## 2014-12-24 DIAGNOSIS — C50911 Malignant neoplasm of unspecified site of right female breast: Secondary | ICD-10-CM | POA: Diagnosis not present

## 2014-12-26 ENCOUNTER — Ambulatory Visit
Admission: RE | Admit: 2014-12-26 | Discharge: 2014-12-26 | Disposition: A | Discharge status: Home / Self Care | Payer: Medicare Other | Source: Ambulatory Visit | Attending: Radiation Oncology | Admitting: Radiation Oncology

## 2014-12-26 DIAGNOSIS — C50911 Malignant neoplasm of unspecified site of right female breast: Secondary | ICD-10-CM | POA: Diagnosis not present

## 2014-12-29 ENCOUNTER — Ambulatory Visit
Admission: RE | Admit: 2014-12-29 | Discharge: 2014-12-29 | Disposition: A | Discharge status: Home / Self Care | Payer: Medicare Other | Source: Ambulatory Visit | Attending: Radiation Oncology | Admitting: Radiation Oncology

## 2014-12-29 ENCOUNTER — Encounter: Payer: Self-pay | Admitting: Radiation Oncology

## 2014-12-29 DIAGNOSIS — C50911 Malignant neoplasm of unspecified site of right female breast: Secondary | ICD-10-CM | POA: Diagnosis not present

## 2014-12-30 ENCOUNTER — Ambulatory Visit (HOSPITAL_BASED_OUTPATIENT_CLINIC_OR_DEPARTMENT_OTHER): Payer: Medicare Other

## 2014-12-30 ENCOUNTER — Other Ambulatory Visit: Payer: Self-pay | Admitting: *Deleted

## 2014-12-30 ENCOUNTER — Other Ambulatory Visit (HOSPITAL_BASED_OUTPATIENT_CLINIC_OR_DEPARTMENT_OTHER): Payer: Medicare Other

## 2014-12-30 ENCOUNTER — Encounter: Payer: Self-pay | Admitting: *Deleted

## 2014-12-30 ENCOUNTER — Ambulatory Visit
Admission: RE | Admit: 2014-12-30 | Discharge: 2014-12-30 | Disposition: A | Discharge status: Home / Self Care | Payer: Medicare Other | Source: Ambulatory Visit | Attending: Radiation Oncology | Admitting: Radiation Oncology

## 2014-12-30 ENCOUNTER — Ambulatory Visit: Payer: Medicare Other | Attending: Radiation Oncology

## 2014-12-30 ENCOUNTER — Other Ambulatory Visit: Payer: Medicare Other

## 2014-12-30 VITALS — BP 155/84 | HR 76 | Temp 97.7°F | Resp 18

## 2014-12-30 DIAGNOSIS — C50911 Malignant neoplasm of unspecified site of right female breast: Secondary | ICD-10-CM | POA: Diagnosis not present

## 2014-12-30 DIAGNOSIS — Z5112 Encounter for antineoplastic immunotherapy: Secondary | ICD-10-CM

## 2014-12-30 DIAGNOSIS — C50211 Malignant neoplasm of upper-inner quadrant of right female breast: Secondary | ICD-10-CM

## 2014-12-30 DIAGNOSIS — C50811 Malignant neoplasm of overlapping sites of right female breast: Secondary | ICD-10-CM

## 2014-12-30 LAB — CBC WITH DIFFERENTIAL/PLATELET
BASO%: 0.6 % (ref 0.0–2.0)
Basophils Absolute: 0 10*3/uL (ref 0.0–0.1)
EOS%: 15.1 % — AB (ref 0.0–7.0)
Eosinophils Absolute: 1 10*3/uL — ABNORMAL HIGH (ref 0.0–0.5)
HEMATOCRIT: 39.7 % (ref 34.8–46.6)
HEMOGLOBIN: 13.2 g/dL (ref 11.6–15.9)
LYMPH#: 1.2 10*3/uL (ref 0.9–3.3)
LYMPH%: 18.3 % (ref 14.0–49.7)
MCH: 35.2 pg — ABNORMAL HIGH (ref 25.1–34.0)
MCHC: 33.3 g/dL (ref 31.5–36.0)
MCV: 105.9 fL — ABNORMAL HIGH (ref 79.5–101.0)
MONO#: 0.7 10*3/uL (ref 0.1–0.9)
MONO%: 10.6 % (ref 0.0–14.0)
NEUT%: 55.4 % (ref 38.4–76.8)
NEUTROS ABS: 3.8 10*3/uL (ref 1.5–6.5)
Platelets: 271 10*3/uL (ref 145–400)
RBC: 3.75 10*6/uL (ref 3.70–5.45)
RDW: 15.2 % — AB (ref 11.2–14.5)
WBC: 6.8 10*3/uL (ref 3.9–10.3)

## 2014-12-30 LAB — COMPREHENSIVE METABOLIC PANEL (CC13)
ALBUMIN: 4 g/dL (ref 3.5–5.0)
ALK PHOS: 77 U/L (ref 40–150)
ALT: 13 U/L (ref 0–55)
AST: 24 U/L (ref 5–34)
Anion Gap: 10 mEq/L (ref 3–11)
BILIRUBIN TOTAL: 0.3 mg/dL (ref 0.20–1.20)
BUN: 10.2 mg/dL (ref 7.0–26.0)
CO2: 25 mEq/L (ref 22–29)
CREATININE: 0.8 mg/dL (ref 0.6–1.1)
Calcium: 9.8 mg/dL (ref 8.4–10.4)
Chloride: 109 mEq/L (ref 98–109)
EGFR: 77 mL/min/{1.73_m2} — ABNORMAL LOW (ref >90)
GLUCOSE: 100 mg/dL (ref 70–140)
Potassium: 4.7 mEq/L (ref 3.5–5.1)
SODIUM: 144 meq/L (ref 136–145)
TOTAL PROTEIN: 7.1 g/dL (ref 6.4–8.3)

## 2014-12-30 MED ORDER — ACETAMINOPHEN 325 MG PO TABS
650.0000 mg | ORAL_TABLET | Freq: Once | ORAL | Status: AC
  Administered 2014-12-30: 325 mg via ORAL

## 2014-12-30 MED ORDER — TRASTUZUMAB CHEMO INJECTION 440 MG
6.0000 mg/kg | Freq: Once | INTRAVENOUS | Status: AC
  Administered 2014-12-30: 294 mg via INTRAVENOUS
  Filled 2014-12-30: qty 14

## 2014-12-30 MED ORDER — ACETAMINOPHEN 325 MG PO TABS
ORAL_TABLET | ORAL | Status: AC
  Filled 2014-12-30: qty 1

## 2014-12-30 MED ORDER — DIPHENHYDRAMINE HCL 25 MG PO CAPS
50.0000 mg | ORAL_CAPSULE | Freq: Once | ORAL | Status: AC
  Administered 2014-12-30: 25 mg via ORAL

## 2014-12-30 MED ORDER — HEPARIN SOD (PORK) LOCK FLUSH 100 UNIT/ML IV SOLN
500.0000 [IU] | Freq: Once | INTRAVENOUS | Status: AC | PRN
  Administered 2014-12-30: 500 [IU]
  Filled 2014-12-30: qty 5

## 2014-12-30 MED ORDER — SODIUM CHLORIDE 0.9 % IV SOLN
Freq: Once | INTRAVENOUS | Status: AC
  Administered 2014-12-30: 09:00:00 via INTRAVENOUS

## 2014-12-30 MED ORDER — DIPHENHYDRAMINE HCL 25 MG PO CAPS
ORAL_CAPSULE | ORAL | Status: AC
  Filled 2014-12-30: qty 1

## 2014-12-30 MED ORDER — SODIUM CHLORIDE 0.9 % IJ SOLN
10.0000 mL | INTRAMUSCULAR | Status: DC | PRN
  Administered 2014-12-30: 10 mL
  Filled 2014-12-30: qty 10

## 2014-12-30 NOTE — Patient Instructions (Signed)
Trastuzumab injection for infusion What is this medicine? TRASTUZUMAB (tras TOO zoo mab) is a monoclonal antibody. It is used to treat breast cancer and stomach cancer. This medicine may be used for other purposes; ask your health care provider or pharmacist if you have questions. What should I tell my health care provider before I take this medicine? They need to know if you have any of these conditions: -heart disease -heart failure -infection (especially a virus infection such as chickenpox, cold sores, or herpes) -lung or breathing disease, like asthma -recent or ongoing radiation therapy -an unusual or allergic reaction to trastuzumab, benzyl alcohol, or other medications, foods, dyes, or preservatives -pregnant or trying to get pregnant -breast-feeding How should I use this medicine? This drug is given as an infusion into a vein. It is administered in a hospital or clinic by a specially trained health care professional. Talk to your pediatrician regarding the use of this medicine in children. This medicine is not approved for use in children. Overdosage: If you think you have taken too much of this medicine contact a poison control center or emergency room at once. NOTE: This medicine is only for you. Do not share this medicine with others. What if I miss a dose? It is important not to miss a dose. Call your doctor or health care professional if you are unable to keep an appointment. What may interact with this medicine? -doxorubicin -warfarin This list may not describe all possible interactions. Give your health care provider a list of all the medicines, herbs, non-prescription drugs, or dietary supplements you use. Also tell them if you smoke, drink alcohol, or use illegal drugs. Some items may interact with your medicine. What should I watch for while using this medicine? Visit your doctor for checks on your progress. Report any side effects. Continue your course of treatment even  though you feel ill unless your doctor tells you to stop. Call your doctor or health care professional for advice if you get a fever, chills or sore throat, or other symptoms of a cold or flu. Do not treat yourself. Try to avoid being around people who are sick. You may experience fever, chills and shaking during your first infusion. These effects are usually mild and can be treated with other medicines. Report any side effects during the infusion to your health care professional. Fever and chills usually do not happen with later infusions. Do not become pregnant while taking this medicine or for 7 months after stopping it. Women should inform their doctor if they wish to become pregnant or think they might be pregnant. Women of child-bearing potential will need to have a negative pregnancy test before starting this medicine. There is a potential for serious side effects to an unborn child. Talk to your health care professional or pharmacist for more information. Do not breast-feed an infant while taking this medicine or for 7 months after stopping it. Women must use effective birth control with this medicine. What side effects may I notice from receiving this medicine? Side effects that you should report to your doctor or other health care professional as soon as possible: -breathing difficulties -chest pain or palpitations -cough -dizziness or fainting -fever or chills, sore throat -skin rash, itching or hives -swelling of the legs or ankles -unusually weak or tired Side effects that usually do not require medical attention (report to your doctor or other health care professional if they continue or are bothersome): -loss of appetite -headache -muscle aches -nausea This   list may not describe all possible side effects. Call your doctor for medical advice about side effects. You may report side effects to FDA at 1-800-FDA-1088. Where should I keep my medicine? This drug is given in a hospital  or clinic and will not be stored at home. NOTE: This sheet is a summary. It may not cover all possible information. If you have questions about this medicine, talk to your doctor, pharmacist, or health care provider.    2016, [employer]vier/Gold Standard. (2014-05-16 11:49:32)   

## 2014-12-30 NOTE — Progress Notes (Signed)
Spoke with patient today as she was receiving herceptin.  She complains of "sour stomach" everyday.  She states she has vomited but it is like clear water when she does.  She states this is an everyday occurrence.   Per Dr. Lindi Adie we will order a CT abd/pelvis and he will see her after the scan to discuss.  Patient verbalized understanding.

## 2014-12-31 ENCOUNTER — Ambulatory Visit: Payer: Medicare Other

## 2014-12-31 ENCOUNTER — Other Ambulatory Visit: Payer: Self-pay | Admitting: *Deleted

## 2014-12-31 DIAGNOSIS — C50911 Malignant neoplasm of unspecified site of right female breast: Secondary | ICD-10-CM | POA: Diagnosis not present

## 2014-12-31 DIAGNOSIS — C50211 Malignant neoplasm of upper-inner quadrant of right female breast: Secondary | ICD-10-CM

## 2014-12-31 MED ORDER — PROMETHAZINE HCL 25 MG PO TABS: 25.0000 mg | ORAL_TABLET | Freq: Four times a day (QID) | ORAL | Status: DC | PRN

## 2014-12-31 NOTE — Telephone Encounter (Signed)
Spoke with patient today and she stated she has had a rough night.  She states she has been up all night sick to her stomach and vomiting clear water.  No other symptoms.  Her CT scan is on 11/14 and confirmed follow up appointment with Dr. Lindi Adie for 11/16 at 930am.  Instructed her to try Ativan under the tongue and per Dr. Lindi Adie we can call in some phenergan for her.  Informed her I would send to her pharmacy.  Encouraged her to call back to let me know how this is working.  Patient verbalized understanding.

## 2015-01-01 ENCOUNTER — Ambulatory Visit
Admission: RE | Admit: 2015-01-01 | Discharge: 2015-01-01 | Disposition: A | Discharge status: Home / Self Care | Payer: Medicare Other | Source: Ambulatory Visit | Attending: Radiation Oncology | Admitting: Radiation Oncology

## 2015-01-01 DIAGNOSIS — C50911 Malignant neoplasm of unspecified site of right female breast: Secondary | ICD-10-CM | POA: Diagnosis not present

## 2015-01-02 ENCOUNTER — Ambulatory Visit
Admission: RE | Admit: 2015-01-02 | Discharge: 2015-01-02 | Disposition: A | Discharge status: Home / Self Care | Payer: Medicare Other | Source: Ambulatory Visit | Attending: Radiation Oncology | Admitting: Radiation Oncology

## 2015-01-02 ENCOUNTER — Encounter: Payer: Self-pay | Admitting: Radiation Oncology

## 2015-01-02 VITALS — BP 125/69 | HR 92 | Temp 99.1°F | Resp 18 | Wt 110.0 lb

## 2015-01-02 DIAGNOSIS — C50911 Malignant neoplasm of unspecified site of right female breast: Secondary | ICD-10-CM | POA: Diagnosis not present

## 2015-01-02 DIAGNOSIS — C50211 Malignant neoplasm of upper-inner quadrant of right female breast: Secondary | ICD-10-CM

## 2015-01-02 MED ORDER — ALRA NON-METALLIC DEODORANT (RAD-ONC)
1.0000 "application " | Freq: Once | TOPICAL | Status: AC
  Administered 2015-01-02: 1 via TOPICAL

## 2015-01-02 MED ORDER — RADIAPLEXRX EX GEL
Freq: Once | CUTANEOUS | Status: AC
  Administered 2015-01-02: 14:00:00 via TOPICAL

## 2015-01-02 NOTE — Progress Notes (Signed)
Pt here for patient teaching.  Pt given Radiation and You booklet, skin care instructions and Radiaplex gel. Pt reports they have watched the Radiation Therapy Education video on December 19, 2014.  Reviewed areas of pertinence such as fatigue, skin changes, breast tenderness and breast swelling . Pt able to give teach back of to pat skin, use unscented/gentle soap and drink plenty of water,apply Radiaplex bid, avoid applying anything to skin within 4 hours of treatment, avoid wearing an under wire bra and to use an electric razor if they must shave. Pt demonstrated understanding of information given and will contact nursing with any questions or concerns.

## 2015-01-02 NOTE — Progress Notes (Signed)
  Radiation Oncology         [phone] ________________________________  Name: LUMEN MCCLARD MRN: [id number]  Date: 12/19/2014  DOB: 08-Nov-1940  SIMULATION AND TREATMENT PLANNING NOTE  The patient presented for simulation prior to beginning her course of radiation treatment for her diagnosis of right-sided breast cancer. The patient was placed in a supine position on a breast board. A customized vac-lock bag was constructed and this complex treatment device will be used on a daily basis during her treatment. In this fashion, a CT scan was obtained through the chest area and an isocenter was placed near the chest wall within the breast.  The patient will be planned to receive a course of radiation initially to a dose of 42.5 Gy. This will consist of a whole breast radiotherapy technique. To accomplish this, 2 customized blocks have been designed which will correspond to medial and lateral whole breast tangent fields. This treatment will be accomplished at 2.5 Gy per fraction. A forward planning technique will also be evaluated to determine if this approach improves the plan. It is anticipated that the patient will then receive a 7.5 Gy boost to the seroma cavity which has been contoured. This will be accomplished at 2.5 Gy per fraction.   This initial treatment will consist of a 3-D conformal technique. The seroma has been contoured as the primary target structure. Additionally, dose volume histograms of both this target as well as the lungs and heart will also be evaluated. Such an approach is necessary to ensure that the target area is adequately covered while the nearby critical  normal structures are adequately spared.  Plan:  The final anticipated total dose therefore will correspond to 50 Gy.    _______________________________   Jodelle Gross, MD, PhD

## 2015-01-02 NOTE — Progress Notes (Signed)
Marilyn Dalton has received 5 fractions to her right chest wall.  Note redness without pain in this area.  She reports itching of this area since having chemotherapy, however, no evidence of rash. Has used hydrocortisone and benadryl po for itching.  Education today.

## 2015-01-02 NOTE — Progress Notes (Signed)
Department of Radiation Oncology  Phone:  [phone] Fax:        [phone]  Weekly Treatment Note    Name: Marilyn Dalton Date: 01/02/2015 MRN: [id number] DOB: 1940/11/09   Current dose: 12.5 Gy  Current fraction: 5   MEDICATIONS: Current Outpatient Prescriptions  Medication Sig Dispense Refill  . docusate sodium (COLACE) 100 MG capsule Take 100 mg by mouth 3 (three) times daily as needed for mild constipation (constipation).    . hyaluronate sodium (RADIAPLEXRX) GEL Apply 1 application topically 2 (two) times daily.    [name] lidocaine-prilocaine (EMLA) cream Apply to affected area once 30 g 3  . promethazine (PHENERGAN) 25 MG tablet Take 1 tablet (25 mg total) by mouth every 6 (six) hours as needed for nausea or vomiting. 30 tablet 1  . cetirizine (ZYRTEC) 10 MG tablet Take 10 mg by mouth daily.    [name] dexamethasone (DECADRON) 4 MG tablet Take 1 tablet (4 mg total) by mouth 2 (two) times daily. Start the day before Taxotere. Then again the day after chemo for 3 days. (Patient not taking: Reported on 12/15/2014) 30 tablet 1  . diphenhydrAMINE (BENADRYL) 25 MG tablet Take 25 mg by mouth 2 (two) times daily.    [name] LORazepam (ATIVAN) 0.5 MG tablet Take 1 tablet (0.5 mg total) by mouth at bedtime. (Patient not taking: Reported on 12/15/2014) 30 tablet 1  . mineral oil enema Place 133 mLs (1 enema total) rectally once. (Patient not taking: Reported on 12/15/2014) 266 mL 0  . ondansetron (ZOFRAN) 8 MG tablet Take 1 tablet (8 mg total) by mouth 2 (two) times daily. Start the day after chemo for 3 days. Then take as needed for nausea or vomiting. (Patient not taking: Reported on 12/15/2014) 30 tablet 1  . prochlorperazine (COMPAZINE) 10 MG tablet Take 1 tablet (10 mg total) by mouth every 6 (six) hours as needed (Nausea or vomiting). (Patient not taking: Reported on 01/02/2015) 30 tablet 1  . senna (SENOKOT) 8.6 MG TABS tablet Take 1 tablet by mouth.     No current  facility-administered medications for this encounter.     ALLERGIES: Review of patient's allergies indicates no known allergies.   LABORATORY DATA:  Lab Results  Component Value Date   WBC 6.8 12/30/2014   HGB 13.2 12/30/2014   HCT 39.7 12/30/2014   MCV 105.9* 12/30/2014   PLT 271 12/30/2014   Lab Results  Component Value Date   NA 144 12/30/2014   K 4.7 12/30/2014   CL 102 07/18/2014   CO2 25 12/30/2014   Lab Results  Component Value Date   ALT 13 12/30/2014   AST 24 12/30/2014   ALKPHOS 77 12/30/2014   BILITOT 0.30 12/30/2014     NARRATIVE: Marilyn Dalton was seen today for weekly treatment management. The chart was checked and the patient's films were reviewed.  Marilyn Dalton has received 5 fractions to her right chest wall.  Note redness without pain in this area.  She reports itching of this area since having chemotherapy, however, no evidence of rash. Has used hydrocortisone and benadryl po for itching.  Education today.  PHYSICAL EXAMINATION: weight is 110 lb (49.896 kg). Her temperature is 99.1 F (37.3 C). Her blood pressure is 125/69 and her pulse is 92. Her respiration is 18 and oxygen saturation is 95%.        ASSESSMENT: The patient is doing satisfactorily with treatment.  PLAN: We will continue with the patient's radiation treatment as  planned.

## 2015-01-05 ENCOUNTER — Ambulatory Visit: Payer: Medicare Other

## 2015-01-05 ENCOUNTER — Ambulatory Visit (HOSPITAL_COMMUNITY)
Admission: RE | Admit: 2015-01-05 | Discharge: 2015-01-05 | Disposition: A | Discharge status: Home / Self Care | Payer: Medicare Other | Source: Ambulatory Visit | Attending: Hematology and Oncology | Admitting: Hematology and Oncology

## 2015-01-05 DIAGNOSIS — C50911 Malignant neoplasm of unspecified site of right female breast: Secondary | ICD-10-CM | POA: Diagnosis not present

## 2015-01-05 DIAGNOSIS — C50211 Malignant neoplasm of upper-inner quadrant of right female breast: Secondary | ICD-10-CM

## 2015-01-05 MED ORDER — IOHEXOL 300 MG/ML  SOLN
100.0000 mL | Freq: Once | INTRAMUSCULAR | Status: AC | PRN
  Administered 2015-01-05: 100 mL via INTRAVENOUS

## 2015-01-06 ENCOUNTER — Ambulatory Visit
Admission: RE | Admit: 2015-01-06 | Discharge: 2015-01-06 | Disposition: A | Discharge status: Home / Self Care | Payer: Medicare Other | Source: Ambulatory Visit | Attending: Radiation Oncology | Admitting: Radiation Oncology

## 2015-01-06 DIAGNOSIS — C50911 Malignant neoplasm of unspecified site of right female breast: Secondary | ICD-10-CM | POA: Diagnosis not present

## 2015-01-06 NOTE — Assessment & Plan Note (Signed)
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 Adjuvant radiation started 12/29/2014 2. Followed by adjuvant antiestrogen therapy with aromatase inhibitors 3. Continue every 3 week Herceptin for 1 year to be completed May 2017  Return to clinic in 6 weeks for follow-up and every 3 weeks for Herceptin treatments

## 2015-01-07 ENCOUNTER — Ambulatory Visit (HOSPITAL_BASED_OUTPATIENT_CLINIC_OR_DEPARTMENT_OTHER): Payer: Medicare Other | Admitting: Hematology and Oncology

## 2015-01-07 ENCOUNTER — Ambulatory Visit
Admission: RE | Admit: 2015-01-07 | Discharge: 2015-01-07 | Disposition: A | Discharge status: Home / Self Care | Payer: Medicare Other | Source: Ambulatory Visit | Attending: Radiation Oncology | Admitting: Radiation Oncology

## 2015-01-07 ENCOUNTER — Ambulatory Visit (HOSPITAL_BASED_OUTPATIENT_CLINIC_OR_DEPARTMENT_OTHER): Payer: Medicare Other

## 2015-01-07 ENCOUNTER — Telehealth: Payer: Self-pay | Admitting: Hematology and Oncology

## 2015-01-07 ENCOUNTER — Encounter: Payer: Self-pay | Admitting: Hematology and Oncology

## 2015-01-07 ENCOUNTER — Other Ambulatory Visit: Payer: Medicare Other

## 2015-01-07 ENCOUNTER — Ambulatory Visit: Payer: Medicare Other

## 2015-01-07 VITALS — BP 122/73 | HR 76

## 2015-01-07 VITALS — BP 142/77 | HR 92 | Temp 97.8°F | Resp 18 | Ht 65.0 in | Wt 110.3 lb

## 2015-01-07 DIAGNOSIS — R1013 Epigastric pain: Secondary | ICD-10-CM | POA: Diagnosis not present

## 2015-01-07 DIAGNOSIS — C50211 Malignant neoplasm of upper-inner quadrant of right female breast: Secondary | ICD-10-CM

## 2015-01-07 DIAGNOSIS — R110 Nausea: Secondary | ICD-10-CM | POA: Diagnosis not present

## 2015-01-07 DIAGNOSIS — C50911 Malignant neoplasm of unspecified site of right female breast: Secondary | ICD-10-CM | POA: Diagnosis not present

## 2015-01-07 MED ORDER — PANTOPRAZOLE SODIUM 40 MG PO TBEC: 40.0000 mg | DELAYED_RELEASE_TABLET | Freq: Two times a day (BID) | ORAL | Status: DC

## 2015-01-07 MED ORDER — PALONOSETRON HCL INJECTION 0.25 MG/5ML
INTRAVENOUS | Status: AC
  Filled 2015-01-07: qty 5

## 2015-01-07 MED ORDER — SODIUM CHLORIDE 0.9 % IJ SOLN
10.0000 mL | Freq: Once | INTRAMUSCULAR | Status: AC
  Administered 2015-01-07: 10 mL
  Filled 2015-01-07: qty 10

## 2015-01-07 MED ORDER — HEPARIN SOD (PORK) LOCK FLUSH 100 UNIT/ML IV SOLN
500.0000 [IU] | Freq: Once | INTRAVENOUS | Status: AC
  Administered 2015-01-07: 500 [IU]
  Filled 2015-01-07: qty 5

## 2015-01-07 MED ORDER — PALONOSETRON HCL INJECTION 0.25 MG/5ML
0.2500 mg | Freq: Once | INTRAVENOUS | Status: AC
  Administered 2015-01-07: 0.25 mg via INTRAVENOUS

## 2015-01-07 MED ORDER — SODIUM CHLORIDE 0.9 % IV SOLN
Freq: Once | INTRAVENOUS | Status: AC
  Administered 2015-01-07: 11:00:00 via INTRAVENOUS

## 2015-01-07 NOTE — Telephone Encounter (Signed)
Appointments made and avs printed for patient °

## 2015-01-07 NOTE — Patient Instructions (Signed)
Palonosetron Injection What is this medicine? PALONOSETRON (pal oh NOE se tron) is used to prevent nausea and vomiting caused by chemotherapy. It also helps prevent delayed nausea and vomiting that may occur a few days after your treatment. This medicine may be used for other purposes; ask your health care provider or pharmacist if you have questions. What should I tell my health care provider before I take this medicine? They need to know if you have any of these conditions: -an unusual or allergic reaction to palonosetron, dolasetron, granisetron, ondansetron, other medicines, foods, dyes, or preservatives -pregnant or trying to get pregnant -breast-feeding How should I use this medicine? This medicine is for infusion into a vein. It is given by a health care professional in a hospital or clinic setting. Talk to your pediatrician regarding the use of this medicine in children. While this drug may be prescribed for children as young as 1 month for selected conditions, precautions do apply. Overdosage: If you think you have taken too much of this medicine contact a poison control center or emergency room at once. NOTE: This medicine is only for you. Do not share this medicine with others. What if I miss a dose? This does not apply. What may interact with this medicine? -certain medicines for depression, anxiety, or psychotic disturbances -fentanyl -linezolid -MAOIs like Carbex, Eldepryl, Marplan, Nardil, and Parnate -methylene blue (injected into a vein) -tramadol This list may not describe all possible interactions. Give your health care provider a list of all the medicines, herbs, non-prescription drugs, or dietary supplements you use. Also tell them if you smoke, drink alcohol, or use illegal drugs. Some items may interact with your medicine. What should I watch for while using this medicine? Your condition will be monitored carefully while you are receiving this medicine. What side  effects may I notice from receiving this medicine? Side effects that you should report to your doctor or health care professional as soon as possible: -allergic reactions like skin rash, itching or hives, swelling of the face, lips, or tongue -breathing problems -confusion -dizziness -fast, irregular heartbeat -fever and chills -loss of balance or coordination -seizures -sweating -swelling of the hands and feet -tremors -unusually weak or tired Side effects that usually do not require medical attention (report to your doctor or health care professional if they continue or are bothersome): -constipation or diarrhea -headache This list may not describe all possible side effects. Call your doctor for medical advice about side effects. You may report side effects to FDA at 1-800-FDA-1088. Where should I keep my medicine? This drug is given in a hospital or clinic and will not be stored at home. NOTE: This sheet is a summary. It may not cover all possible information. If you have questions about this medicine, talk to your doctor, pharmacist, or health care provider.    2016, Elsevier/Gold Standard. (2012-12-14 10:38:36) Dehydration, Adult Dehydration is a condition in which you do not have enough fluid or water in your body. It happens when you take in less fluid than you lose. Vital organs such as the kidneys, brain, and heart cannot function without a proper amount of fluids. Any loss of fluids from the body can cause dehydration.  Dehydration can range from mild to severe. This condition should be treated right away to help prevent it from becoming severe. CAUSES  This condition may be caused by:  Vomiting.  Diarrhea.  Excessive sweating, such as when exercising in hot or humid weather.  Not drinking enough fluid  during strenuous exercise or during an illness.  Excessive urine output.  Fever.  Certain medicines. RISK FACTORS This condition is more likely to develop  in:  People who are taking certain medicines that cause the body to lose excess fluid (diuretics).   People who have a chronic illness, such as diabetes, that may increase urination.  Older adults.   People who live at high altitudes.   People who participate in endurance sports.  SYMPTOMS  Mild Dehydration  Thirst.  Dry lips.  Slightly dry mouth.  Dry, warm skin. Moderate Dehydration  Very dry mouth.   Muscle cramps.   Dark urine and decreased urine production.   Decreased tear production.   Headache.   Light-headedness, especially when you stand up from a sitting position.  Severe Dehydration  Changes in skin.   Cold and clammy skin.   Skin does not spring back quickly when lightly pinched and released.   Changes in body fluids.   Extreme thirst.   No tears.   Not able to sweat when body temperature is high, such as in hot weather.   Minimal urine production.   Changes in vital signs.   Rapid, weak pulse (more than 100 beats per minute when you are sitting still).   Rapid breathing.   Low blood pressure.   Other changes.   Sunken eyes.   Cold hands and feet.   Confusion.  Lethargy and difficulty being awakened.  Fainting (syncope).   Short-term weight loss.   Unconsciousness. DIAGNOSIS  This condition may be diagnosed based on your symptoms. You may also have tests to determine how severe your dehydration is. These tests may include:   Urine tests.   Blood tests.  TREATMENT  Treatment for this condition depends on the severity. Mild or moderate dehydration can often be treated at home. Treatment should be started right away. Do not wait until dehydration becomes severe. Severe dehydration needs to be treated at the hospital. Treatment for Mild Dehydration  Drinking plenty of water to replace the fluid you have lost.   Replacing minerals in your blood (electrolytes) that you may have lost.   Treatment for Moderate Dehydration  Consuming oral rehydration solution (ORS). Treatment for Severe Dehydration  Receiving fluid through an IV tube.   Receiving electrolyte solution through a feeding tube that is passed through your nose and into your stomach (nasogastric tube or NG tube).  Correcting any abnormalities in electrolytes. HOME CARE INSTRUCTIONS   Drink enough fluid to keep your urine clear or pale yellow.   Drink water or fluid slowly by taking small sips. You can also try sucking on ice cubes.  Have food or beverages that contain electrolytes. Examples include bananas and sports drinks.  Take over-the-counter and prescription medicines only as told by your health care provider.   Prepare ORS according to the manufacturer's instructions. Take sips of ORS every 5 minutes until your urine returns to normal.  If you have vomiting or diarrhea, continue to try to drink water, ORS, or both.   If you have diarrhea, avoid:   Beverages that contain caffeine.   Fruit juice.   Milk.   Carbonated soft drinks.  Do not take salt tablets. This can lead to the condition of having too much sodium in your body (hypernatremia).  SEEK MEDICAL CARE IF:  You cannot eat or drink without vomiting.  You have had moderate diarrhea during a period of more than 24 hours.  You have a fever. SEEK IMMEDIATE  MEDICAL CARE IF:   You have extreme thirst.  You have severe diarrhea.  You have not urinated in 6-8 hours, or you have urinated only a small amount of very dark urine.  You have shriveled skin.  You are dizzy, confused, or both.   This information is not intended to replace advice given to you by your health care provider. Make sure you discuss any questions you have with your health care provider.   Document Released: 02/07/2005 Document Revised: 10/29/2014 Document Reviewed: 06/25/2014 Elsevier Interactive Patient Education [employer].

## 2015-01-07 NOTE — Progress Notes (Signed)
Patient Care Team: [name], MD as PCP - General (Family Medicine)  DIAGNOSIS: No matching staging information was found for the patient.  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.97   06/30/2014 Breast MRI Right breast 2.4 x 2.2 x 2.2 cm irregular mass, no abnormal lymph nodes   07/14/2014 - 10/28/2014 Neo-Adjuvant Chemotherapy TCH Perjeta every 3 weeks 6 followed by Herceptin maintenance   11/06/2014 Breast MRI Right breast mass has significantly decreased in size and is less masslike, maximal enhancement is 1.5 cm previously 2.4 cm   12/02/2014 Surgery Right lumpectomy: Residual IDC grade 3; 2.4 cm with DCIS high-grade, posterior margin probably positive for invasive cancer, 1/4 lymph nodes positive with extracapsular extension, T2 N1 stage IIB   12/29/2014 -  Radiation Therapy Adjuvant radiation therapy    CHIEF COMPLIANT: Intractable nausea and vomiting  INTERVAL HISTORY: Marilyn Dalton is a 74 year old with above-mentioned history of right breast cancer currently undergoing radiation therapy along with Herceptin maintenance. She came in today complaining of intractable nausea and vomiting issues. She had a prescription for Phenergan which appear to be helping but it only last for 4-5 hours of taking the medicine. She is currently undergoing adjuvant radiation therapy. Appears to be doing quite well.  REVIEW OF SYSTEMS:   Constitutional: Denies fevers, chills or abnormal weight loss Eyes: Denies blurriness of vision Ears, nose, mouth, throat, and face: Denies mucositis or sore throat Respiratory: Denies cough, dyspnea or wheezes Cardiovascular: Denies palpitation, chest discomfort or lower extremity swelling Gastrointestinal:  Denies nausea, heartburn or change in bowel habits Skin: Denies  abnormal skin rashes Lymphatics: Denies new lymphadenopathy or easy bruising Neurological:Denies numbness, tingling or new weaknesses Behavioral/Psych: Mood is stable, no new changes  Breast:  Pain related to surgery previously. All other systems were reviewed with the patient and are negative.  I have reviewed the past medical history, past surgical history, social history and family history with the patient and they are unchanged from previous note.  ALLERGIES:  has No Known Allergies.  MEDICATIONS:  Current Outpatient Prescriptions  Medication Sig Dispense Refill  . cetirizine (ZYRTEC) 10 MG tablet Take 10 mg by mouth daily.    [name] dexamethasone (DECADRON) 4 MG tablet Take 1 tablet (4 mg total) by mouth 2 (two) times daily. Start the day before Taxotere. Then again the day after chemo for 3 days. (Patient not taking: Reported on 12/15/2014) 30 tablet 1  . diphenhydrAMINE (BENADRYL) 25 MG tablet Take 25 mg by mouth 2 (two) times daily.    [name] docusate sodium (COLACE) 100 MG capsule Take 100 mg by mouth 3 (three) times daily as needed for mild constipation (constipation).    . hyaluronate sodium (RADIAPLEXRX) GEL Apply 1 application topically 2 (two) times daily.    [name] lidocaine-prilocaine (EMLA) cream Apply to affected area once 30 g 3  . LORazepam (ATIVAN) 0.5 MG tablet Take 1 tablet (0.5 mg total) by mouth at bedtime. (Patient not taking: Reported on 12/15/2014) 30 tablet 1  . mineral oil enema Place 133 mLs (1 enema total) rectally once. (Patient not taking: Reported on 12/15/2014) 266 mL 0  . ondansetron (ZOFRAN) 8 MG tablet Take 1 tablet (8 mg total) by mouth 2 (two) times daily. Start the day after chemo for 3 days. Then take as needed for nausea  or vomiting. (Patient not taking: Reported on 12/15/2014) 30 tablet 1  . prochlorperazine (COMPAZINE) 10 MG tablet Take 1 tablet (10 mg total) by mouth every 6 (six) hours as needed (Nausea or vomiting). (Patient not taking: Reported on 01/02/2015)  30 tablet 1  . promethazine (PHENERGAN) 25 MG tablet Take 1 tablet (25 mg total) by mouth every 6 (six) hours as needed for nausea or vomiting. 30 tablet 1  . senna (SENOKOT) 8.6 MG TABS tablet Take 1 tablet by mouth.     No current facility-administered medications for this visit.    PHYSICAL EXAMINATION: ECOG PERFORMANCE STATUS: 1 - Symptomatic but completely ambulatory  Filed Vitals:   01/07/15 0847  BP: 142/77  Pulse: 92  Temp: 97.8 F (36.6 C)  Resp: 18   Filed Weights   01/07/15 0847  Weight: 110 lb 4.8 oz (50.032 kg)    GENERAL:alert, no distress and comfortable SKIN: skin color, texture, turgor are normal, no rashes or significant lesions EYES: normal, Conjunctiva are pink and non-injected, sclera clear OROPHARYNX:no exudate, no erythema and lips, buccal mucosa, and tongue normal  NECK: supple, thyroid normal size, non-tender, without nodularity LYMPH:  no palpable lymphadenopathy in the cervical, axillary or inguinal LUNGS: clear to auscultation and percussion with normal breathing effort HEART: regular rate & rhythm and no murmurs and no lower extremity edema ABDOMEN:abdomen soft, non-tender and normal bowel sounds Musculoskeletal:no cyanosis of digits and no clubbing  NEURO: alert & oriented x 3 with fluent speech, no focal motor/sensory deficits   LABORATORY DATA:  I have reviewed the data as listed   Chemistry      Component Value Date/Time   NA 144 12/30/2014 0811   NA 140 07/18/2014 0151   K 4.7 12/30/2014 0811   K 3.6 07/18/2014 0151   CL 102 07/18/2014 0151   CO2 25 12/30/2014 0811   CO2 27 07/10/2014 1120   BUN 10.2 12/30/2014 0811   BUN 19 07/18/2014 0151   CREATININE 0.8 12/30/2014 0811   CREATININE 0.70 07/18/2014 0151      Component Value Date/Time   CALCIUM 9.8 12/30/2014 0811   CALCIUM 10.1 07/10/2014 1120   ALKPHOS 77 12/30/2014 0811   ALKPHOS 70 07/10/2014 1120   AST 24 12/30/2014 0811   AST 25 07/10/2014 1120   ALT 13 12/30/2014  0811   ALT 19 07/10/2014 1120   BILITOT 0.30 12/30/2014 0811   BILITOT 0.5 07/10/2014 1120       Lab Results  Component Value Date   WBC 6.8 12/30/2014   HGB 13.2 12/30/2014   HCT 39.7 12/30/2014   MCV 105.9* 12/30/2014   PLT 271 12/30/2014   NEUTROABS 3.8 12/30/2014    ASSESSMENT & PLAN:  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 Adjuvant radiation started 12/29/2014 2. Followed by adjuvant antiestrogen therapy with aromatase inhibitors 3. Continue every 3 week Herceptin for 1 year to be completed May 2017  Profound nausea and vomiting: Anterior etiology. I  suspect this may be related to gastritis because she has epigastric abdominal pain. She has tried over-the-counter medications but they have not resulted. We prescribed her Phenergan for the nausea which appears to be helping but only last 4-5 hours.  Plan: 1. We will give her Aloxi with saline today. 2. Add Aloxi to Herceptin treatments. 3. Patient has Compazine at home and she will take that. 4. I sent a prescription for orthotics 40 mg by mouth twice a day to control her severe gastritis symptoms. If it does not resolve her symptoms and I might have to send her to gastroenterology.  Return to clinic in the next Herceptin treatment.   No orders of the defined types were placed in this encounter.   The patient has a good understanding of the overall plan.  she agrees with it. she will call with any problems that may develop before the next visit here.   Rulon Eisenmenger, MD 01/07/2015

## 2015-01-07 NOTE — Addendum Note (Signed)
Addended by: Prentiss Bells on: 01/07/2015 05:21 PM   Modules accepted: Medications

## 2015-01-08 ENCOUNTER — Ambulatory Visit
Admission: RE | Admit: 2015-01-08 | Discharge: 2015-01-08 | Disposition: A | Discharge status: Home / Self Care | Payer: Medicare Other | Source: Ambulatory Visit | Attending: Radiation Oncology | Admitting: Radiation Oncology

## 2015-01-08 ENCOUNTER — Other Ambulatory Visit: Payer: Self-pay

## 2015-01-08 DIAGNOSIS — C50211 Malignant neoplasm of upper-inner quadrant of right female breast: Secondary | ICD-10-CM

## 2015-01-08 DIAGNOSIS — C50911 Malignant neoplasm of unspecified site of right female breast: Secondary | ICD-10-CM | POA: Diagnosis not present

## 2015-01-08 MED ORDER — PANTOPRAZOLE SODIUM 40 MG PO TBEC: 40.0000 mg | DELAYED_RELEASE_TABLET | Freq: Every day | ORAL | Status: DC

## 2015-01-08 NOTE — Telephone Encounter (Signed)
LMOVM - Per Dr. Lindi Adie, he wants pt to take 40 mg protonix a day.  New prescription sent over for protonix 40 mg daily, fill 30.  Pt is NOT to take two tablets.

## 2015-01-09 ENCOUNTER — Ambulatory Visit: Admission: RE | Admit: 2015-01-09 | Payer: Medicare Other | Source: Ambulatory Visit | Admitting: Radiation Oncology

## 2015-01-09 ENCOUNTER — Ambulatory Visit
Admission: RE | Admit: 2015-01-09 | Discharge: 2015-01-09 | Disposition: A | Discharge status: Home / Self Care | Payer: Medicare Other | Source: Ambulatory Visit | Attending: Radiation Oncology | Admitting: Radiation Oncology

## 2015-01-09 ENCOUNTER — Encounter: Payer: Self-pay | Admitting: Radiation Oncology

## 2015-01-09 ENCOUNTER — Ambulatory Visit: Payer: Medicare Other | Admitting: Radiation Oncology

## 2015-01-09 VITALS — BP 152/88 | HR 79 | Temp 97.6°F | Resp 20 | Wt 111.2 lb

## 2015-01-09 DIAGNOSIS — C50211 Malignant neoplasm of upper-inner quadrant of right female breast: Secondary | ICD-10-CM

## 2015-01-09 DIAGNOSIS — C50911 Malignant neoplasm of unspecified site of right female breast: Secondary | ICD-10-CM | POA: Diagnosis not present

## 2015-01-09 NOTE — Progress Notes (Signed)
Department of Radiation Oncology  Phone:  [phone] Fax:        [phone]  Weekly Treatment Note    Name: Marilyn Dalton Date: 01/09/2015 MRN: [id number] DOB: 04/30/1940   Current dose: 25 Gy  Current fraction: 10   MEDICATIONS: Current Outpatient Prescriptions  Medication Sig Dispense Refill  . cetirizine (ZYRTEC) 10 MG tablet Take 10 mg by mouth daily.    [name] dexamethasone (DECADRON) 4 MG tablet Take 1 tablet (4 mg total) by mouth 2 (two) times daily. Start the day before Taxotere. Then again the day after chemo for 3 days. 30 tablet 1  . diphenhydrAMINE (BENADRYL) 25 MG tablet Take 25 mg by mouth 2 (two) times daily.    [name] docusate sodium (COLACE) 100 MG capsule Take 100 mg by mouth 3 (three) times daily as needed for mild constipation (constipation).    . hyaluronate sodium (RADIAPLEXRX) GEL Apply 1 application topically 2 (two) times daily.    [name] lidocaine-prilocaine (EMLA) cream Apply to affected area once 30 g 3  . LORazepam (ATIVAN) 0.5 MG tablet Take 1 tablet (0.5 mg total) by mouth at bedtime. 30 tablet 1  . mineral oil enema Place 133 mLs (1 enema total) rectally once. 266 mL 0  . ondansetron (ZOFRAN) 8 MG tablet Take 1 tablet (8 mg total) by mouth 2 (two) times daily. Start the day after chemo for 3 days. Then take as needed for nausea or vomiting. 30 tablet 1  . pantoprazole (PROTONIX) 40 MG tablet Take 1 tablet (40 mg total) by mouth daily. 30 tablet 3  . prochlorperazine (COMPAZINE) 10 MG tablet Take 1 tablet (10 mg total) by mouth every 6 (six) hours as needed (Nausea or vomiting). (Patient not taking: Reported on 01/09/2015) 30 tablet 1  . promethazine (PHENERGAN) 25 MG tablet Take 1 tablet (25 mg total) by mouth every 6 (six) hours as needed for nausea or vomiting. 30 tablet 1  . senna (SENOKOT) 8.6 MG TABS tablet Take 1 tablet by mouth.     No current facility-administered medications for this encounter.     ALLERGIES: Review of patient's allergies  indicates no known allergies.   LABORATORY DATA:  Lab Results  Component Value Date   WBC 6.8 12/30/2014   HGB 13.2 12/30/2014   HCT 39.7 12/30/2014   MCV 105.9* 12/30/2014   PLT 271 12/30/2014   Lab Results  Component Value Date   NA 144 12/30/2014   K 4.7 12/30/2014   CL 102 07/18/2014   CO2 25 12/30/2014   Lab Results  Component Value Date   ALT 13 12/30/2014   AST 24 12/30/2014   ALKPHOS 77 12/30/2014   BILITOT 0.30 12/30/2014     NARRATIVE: Marilyn Dalton was seen today for weekly treatment management. The chart was checked and the patient's films were reviewed.  Marilyn Dalton has received 10 fractions to her right chest wall. She has erythema and nipple discoloration is darker and hard, but she is massaging it daily. She says she has no pain but has nausea. She has picked up her phenergan and will take it when she gets home. She is taking radiaplex twice a day. Her skin does not feel irritated.  PHYSICAL EXAMINATION: vitals were not taken for this visit.  BP 152/88 mmHg  Pulse 79  Temp(Src) 97.6 F (36.4 C) (Oral)  Resp 20  Wt 111 lb 3.2 oz (50.44 kg)  Wt Readings from Last 3 Encounters:   01/09/15  111 lb  3.2 oz (50.44 kg)   01/07/15  110 lb 4.8 oz (50.032 kg)   01/02/15  110 lb (49.896 kg)   Mild hyperpigmentation.       ASSESSMENT: The patient is doing satisfactorily with treatment.  PLAN: We will continue with the patient's radiation treatment as planned.        This document serves as a record of services personally performed by Kyung Rudd, MD. It was created on his behalf by  Lendon Collar, a trained medical scribe. The creation of this record is based on the scribe's personal observations and the provider's statements to them. This document has been checked and approved by the attending provider.

## 2015-01-09 NOTE — Progress Notes (Addendum)
Weekly rad txs right breat, erythema , nipple discoloration darker, and hard, but patient massages daily, no pain just nausea and has picked up her phergan rx will take when she gets home, using radiaplex bid 11:11 AM BP 152/88 mmHg  Pulse 79  Temp(Src) 97.6 F (36.4 C) (Oral)  Resp 20  Wt 111 lb 3.2 oz (50.44 kg) Wt Readings from Last 3 Encounters:  01/09/15 111 lb 3.2 oz (50.44 kg)  01/07/15 110 lb 4.8 oz (50.032 kg)  01/02/15 110 lb (49.896 kg)

## 2015-01-11 ENCOUNTER — Ambulatory Visit
Admission: RE | Admit: 2015-01-11 | Discharge: 2015-01-11 | Disposition: A | Discharge status: Home / Self Care | Payer: Medicare Other | Source: Ambulatory Visit | Attending: Radiation Oncology | Admitting: Radiation Oncology

## 2015-01-11 DIAGNOSIS — C50911 Malignant neoplasm of unspecified site of right female breast: Secondary | ICD-10-CM | POA: Diagnosis not present

## 2015-01-12 ENCOUNTER — Ambulatory Visit
Admission: RE | Admit: 2015-01-12 | Discharge: 2015-01-12 | Disposition: A | Discharge status: Home / Self Care | Payer: Medicare Other | Source: Ambulatory Visit | Attending: Radiation Oncology | Admitting: Radiation Oncology

## 2015-01-12 DIAGNOSIS — C50911 Malignant neoplasm of unspecified site of right female breast: Secondary | ICD-10-CM | POA: Diagnosis not present

## 2015-01-12 DIAGNOSIS — C50211 Malignant neoplasm of upper-inner quadrant of right female breast: Secondary | ICD-10-CM

## 2015-01-12 MED ORDER — SONAFINE EX EMUL
1.0000 "application " | Freq: Once | CUTANEOUS | Status: AC
  Administered 2015-01-12: 1 via TOPICAL
  Filled 2015-01-12: qty 45

## 2015-01-12 NOTE — Progress Notes (Signed)
Patient presented to the clinic following radiation treatment. She reported pimples on her right breast. 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 Provided patient with sonafine cream to reduce itching and moisturize her skin. Directed upon use of sonafine and patient verbalized understanding.

## 2015-01-13 ENCOUNTER — Ambulatory Visit
Admission: RE | Admit: 2015-01-13 | Discharge: 2015-01-13 | Disposition: A | Discharge status: Home / Self Care | Payer: Medicare Other | Source: Ambulatory Visit | Attending: Radiation Oncology | Admitting: Radiation Oncology

## 2015-01-13 VITALS — BP 177/78 | HR 101 | Temp 97.5°F | Resp 18 | Wt 113.4 lb

## 2015-01-13 DIAGNOSIS — C50911 Malignant neoplasm of unspecified site of right female breast: Secondary | ICD-10-CM | POA: Diagnosis not present

## 2015-01-13 DIAGNOSIS — C50211 Malignant neoplasm of upper-inner quadrant of right female breast: Secondary | ICD-10-CM

## 2015-01-13 NOTE — Progress Notes (Signed)
Weekly rad txs 13/17 right breast completed, using sonafine cream now which is helping the itchiness and sopreness, slight dermatigis showing, mild erythema 11:40 AM BP 177/78 mmHg  Pulse 101  Temp(Src) 97.5 F (36.4 C)  Resp 18  Wt 113 lb 6.4 oz (51.438 kg)  SpO2 99%  Wt Readings from Last 3 Encounters:  01/13/15 113 lb 6.4 oz (51.438 kg)  01/09/15 111 lb 3.2 oz (50.44 kg)  01/07/15 110 lb 4.8 oz (50.032 kg)

## 2015-01-13 NOTE — Progress Notes (Signed)
Department of Radiation Oncology  Phone:  [phone] Fax:        [phone]  Weekly Treatment Note    Name: Marilyn Dalton Date: 01/13/2015 MRN: [id number] DOB: 05/23/1940   Current dose: 32.5 Gy  Current fraction: 13   MEDICATIONS: Current Outpatient Prescriptions  Medication Sig Dispense Refill  . cetirizine (ZYRTEC) 10 MG tablet Take 10 mg by mouth daily.    [name] dexamethasone (DECADRON) 4 MG tablet Take 1 tablet (4 mg total) by mouth 2 (two) times daily. Start the day before Taxotere. Then again the day after chemo for 3 days. 30 tablet 1  . diphenhydrAMINE (BENADRYL) 25 MG tablet Take 25 mg by mouth 2 (two) times daily.    [name] docusate sodium (COLACE) 100 MG capsule Take 100 mg by mouth 3 (three) times daily as needed for mild constipation (constipation).    . hyaluronate sodium (RADIAPLEXRX) GEL Apply 1 application topically 2 (two) times daily.    [name] lidocaine-prilocaine (EMLA) cream Apply to affected area once 30 g 3  . LORazepam (ATIVAN) 0.5 MG tablet Take 1 tablet (0.5 mg total) by mouth at bedtime. 30 tablet 1  . mineral oil enema Place 133 mLs (1 enema total) rectally once. 266 mL 0  . ondansetron (ZOFRAN) 8 MG tablet Take 1 tablet (8 mg total) by mouth 2 (two) times daily. Start the day after chemo for 3 days. Then take as needed for nausea or vomiting. 30 tablet 1  . pantoprazole (PROTONIX) 40 MG tablet Take 1 tablet (40 mg total) by mouth daily. 30 tablet 3  . promethazine (PHENERGAN) 25 MG tablet Take 1 tablet (25 mg total) by mouth every 6 (six) hours as needed for nausea or vomiting. 30 tablet 1  . senna (SENOKOT) 8.6 MG TABS tablet Take 1 tablet by mouth.    . prochlorperazine (COMPAZINE) 10 MG tablet Take 1 tablet (10 mg total) by mouth every 6 (six) hours as needed (Nausea or vomiting). (Patient not taking: Reported on 01/09/2015) 30 tablet 1   No current facility-administered medications for this encounter.     ALLERGIES: Review of patient's allergies  indicates no known allergies.   LABORATORY DATA:  Lab Results  Component Value Date   WBC 6.8 12/30/2014   HGB 13.2 12/30/2014   HCT 39.7 12/30/2014   MCV 105.9* 12/30/2014   PLT 271 12/30/2014   Lab Results  Component Value Date   NA 144 12/30/2014   K 4.7 12/30/2014   CL 102 07/18/2014   CO2 25 12/30/2014   Lab Results  Component Value Date   ALT 13 12/30/2014   AST 24 12/30/2014   ALKPHOS 77 12/30/2014   BILITOT 0.30 12/30/2014     NARRATIVE: Marilyn Dalton was seen today for weekly treatment management. The chart was checked and the patient's films were reviewed.  Weekly rad txs 13/17 right breast completed, using sonafine cream now which is helping the itchiness and sopreness, slight dermatigis showing, mild erythema 2:44 PM BP 177/78 mmHg  Pulse 101  Temp(Src) 97.5 F (36.4 C)  Resp 18  Wt 113 lb 6.4 oz (51.438 kg)  SpO2 99%  Wt Readings from Last 3 Encounters:  01/13/15 113 lb 6.4 oz (51.438 kg)  01/09/15 111 lb 3.2 oz (50.44 kg)  01/07/15 110 lb 4.8 oz (50.032 kg)    PHYSICAL EXAMINATION: weight is 113 lb 6.4 oz (51.438 kg). Her temperature is 97.5 F (36.4 C). Her blood pressure is 177/78 and her pulse is  101. Her respiration is 18 and oxygen saturation is 99%.        ASSESSMENT: The patient is doing satisfactorily with treatment.  PLAN: We will continue with the patient's radiation treatment as planned.

## 2015-01-14 ENCOUNTER — Telehealth: Payer: Self-pay | Admitting: *Deleted

## 2015-01-14 ENCOUNTER — Ambulatory Visit
Admission: RE | Admit: 2015-01-14 | Discharge: 2015-01-14 | Disposition: A | Discharge status: Home / Self Care | Payer: Medicare Other | Source: Ambulatory Visit | Attending: Radiation Oncology | Admitting: Radiation Oncology

## 2015-01-14 DIAGNOSIS — C50911 Malignant neoplasm of unspecified site of right female breast: Secondary | ICD-10-CM | POA: Diagnosis not present

## 2015-01-14 NOTE — Telephone Encounter (Signed)
[employer] called with questions for pantoprazole prior authorization.  Call transferred to [employer].

## 2015-01-14 NOTE — Telephone Encounter (Signed)
[employer] called back as voicemail for Drug Replacement specialist repors she is out of office.  This nurse answered questions "does Dr. Lindi Adie wants 1 pills for thirty days or thirty for thirty days".  Per order in EMR thirty for thirty days is ordered with three refills.

## 2015-01-19 ENCOUNTER — Ambulatory Visit
Admission: RE | Admit: 2015-01-19 | Discharge: 2015-01-19 | Disposition: A | Discharge status: Home / Self Care | Payer: Medicare Other | Source: Ambulatory Visit | Attending: Radiation Oncology | Admitting: Radiation Oncology

## 2015-01-19 DIAGNOSIS — C50211 Malignant neoplasm of upper-inner quadrant of right female breast: Secondary | ICD-10-CM

## 2015-01-19 DIAGNOSIS — C50911 Malignant neoplasm of unspecified site of right female breast: Secondary | ICD-10-CM | POA: Diagnosis not present

## 2015-01-19 MED ORDER — SONAFINE EX EMUL
1.0000 "application " | Freq: Once | CUTANEOUS | Status: AC
  Administered 2015-01-19: 1 via TOPICAL
  Filled 2015-01-19: qty 45

## 2015-01-20 ENCOUNTER — Ambulatory Visit
Admission: RE | Admit: 2015-01-20 | Discharge: 2015-01-20 | Disposition: A | Discharge status: Home / Self Care | Payer: Medicare Other | Source: Ambulatory Visit | Attending: Radiation Oncology | Admitting: Radiation Oncology

## 2015-01-20 ENCOUNTER — Encounter: Payer: Self-pay | Admitting: Hematology and Oncology

## 2015-01-20 ENCOUNTER — Ambulatory Visit (HOSPITAL_BASED_OUTPATIENT_CLINIC_OR_DEPARTMENT_OTHER): Payer: Medicare Other | Admitting: Hematology and Oncology

## 2015-01-20 ENCOUNTER — Encounter: Payer: Self-pay | Admitting: *Deleted

## 2015-01-20 ENCOUNTER — Ambulatory Visit (HOSPITAL_BASED_OUTPATIENT_CLINIC_OR_DEPARTMENT_OTHER): Payer: Medicare Other

## 2015-01-20 ENCOUNTER — Other Ambulatory Visit (HOSPITAL_BASED_OUTPATIENT_CLINIC_OR_DEPARTMENT_OTHER): Payer: Medicare Other

## 2015-01-20 ENCOUNTER — Other Ambulatory Visit: Payer: Medicare Other

## 2015-01-20 VITALS — BP 149/85 | HR 89 | Temp 97.0°F | Resp 18 | Ht 65.0 in | Wt 111.4 lb

## 2015-01-20 DIAGNOSIS — R112 Nausea with vomiting, unspecified: Secondary | ICD-10-CM

## 2015-01-20 DIAGNOSIS — C50211 Malignant neoplasm of upper-inner quadrant of right female breast: Secondary | ICD-10-CM

## 2015-01-20 DIAGNOSIS — Z5112 Encounter for antineoplastic immunotherapy: Secondary | ICD-10-CM | POA: Diagnosis present

## 2015-01-20 DIAGNOSIS — C50911 Malignant neoplasm of unspecified site of right female breast: Secondary | ICD-10-CM | POA: Diagnosis not present

## 2015-01-20 LAB — CBC WITH DIFFERENTIAL/PLATELET
BASO%: 0.8 % (ref 0.0–2.0)
BASOS ABS: 0 10*3/uL (ref 0.0–0.1)
EOS ABS: 0.7 10*3/uL — AB (ref 0.0–0.5)
EOS%: 11.5 % — ABNORMAL HIGH (ref 0.0–7.0)
HCT: 41.8 % (ref 34.8–46.6)
HGB: 13.7 g/dL (ref 11.6–15.9)
LYMPH%: 15.1 % (ref 14.0–49.7)
MCH: 33.6 pg (ref 25.1–34.0)
MCHC: 32.8 g/dL (ref 31.5–36.0)
MCV: 102.3 fL — AB (ref 79.5–101.0)
MONO#: 0.6 10*3/uL (ref 0.1–0.9)
MONO%: 9.6 % (ref 0.0–14.0)
NEUT#: 3.7 10*3/uL (ref 1.5–6.5)
NEUT%: 63 % (ref 38.4–76.8)
Platelets: 285 10*3/uL (ref 145–400)
RBC: 4.09 10*6/uL (ref 3.70–5.45)
RDW: 14.8 % — ABNORMAL HIGH (ref 11.2–14.5)
WBC: 5.9 10*3/uL (ref 3.9–10.3)
lymph#: 0.9 10*3/uL (ref 0.9–3.3)

## 2015-01-20 LAB — COMPREHENSIVE METABOLIC PANEL (CC13)
ALBUMIN: 3.8 g/dL (ref 3.5–5.0)
ALK PHOS: 67 U/L (ref 40–150)
ALT: <9 U/L (ref 0–55)
AST: 18 U/L (ref 5–34)
Anion Gap: 9 mEq/L (ref 3–11)
BUN: 9.4 mg/dL (ref 7.0–26.0)
CO2: 25 meq/L (ref 22–29)
Calcium: 9.8 mg/dL (ref 8.4–10.4)
Chloride: 106 mEq/L (ref 98–109)
Creatinine: 0.8 mg/dL (ref 0.6–1.1)
EGFR: 70 mL/min/{1.73_m2} — AB (ref >90)
GLUCOSE: 105 mg/dL (ref 70–140)
POTASSIUM: 4.2 meq/L (ref 3.5–5.1)
SODIUM: 141 meq/L (ref 136–145)
Total Bilirubin: 0.42 mg/dL (ref 0.20–1.20)
Total Protein: 7.2 g/dL (ref 6.4–8.3)

## 2015-01-20 MED ORDER — ACETAMINOPHEN 325 MG PO TABS
ORAL_TABLET | ORAL | Status: AC
  Filled 2015-01-20: qty 2

## 2015-01-20 MED ORDER — ANASTROZOLE 1 MG PO TABS: 1.0000 mg | ORAL_TABLET | Freq: Every day | ORAL | Status: DC

## 2015-01-20 MED ORDER — TRASTUZUMAB CHEMO INJECTION 440 MG
6.0000 mg/kg | Freq: Once | INTRAVENOUS | Status: AC
  Administered 2015-01-20: 294 mg via INTRAVENOUS
  Filled 2015-01-20: qty 14

## 2015-01-20 MED ORDER — SODIUM CHLORIDE 0.9 % IJ SOLN
10.0000 mL | INTRAMUSCULAR | Status: DC | PRN
  Administered 2015-01-20: 10 mL
  Filled 2015-01-20: qty 10

## 2015-01-20 MED ORDER — DIPHENHYDRAMINE HCL 25 MG PO CAPS
50.0000 mg | ORAL_CAPSULE | Freq: Once | ORAL | Status: AC
  Administered 2015-01-20: 25 mg via ORAL

## 2015-01-20 MED ORDER — PALONOSETRON HCL INJECTION 0.25 MG/5ML
0.2500 mg | Freq: Once | INTRAVENOUS | Status: AC
  Administered 2015-01-20: 0.25 mg via INTRAVENOUS

## 2015-01-20 MED ORDER — DIPHENHYDRAMINE HCL 25 MG PO CAPS
ORAL_CAPSULE | ORAL | Status: AC
  Filled 2015-01-20: qty 2

## 2015-01-20 MED ORDER — ACETAMINOPHEN 325 MG PO TABS
650.0000 mg | ORAL_TABLET | Freq: Once | ORAL | Status: AC
  Administered 2015-01-20: 325 mg via ORAL

## 2015-01-20 MED ORDER — SODIUM CHLORIDE 0.9 % IV SOLN
Freq: Once | INTRAVENOUS | Status: AC
  Administered 2015-01-20: 10:00:00 via INTRAVENOUS

## 2015-01-20 MED ORDER — HEPARIN SOD (PORK) LOCK FLUSH 100 UNIT/ML IV SOLN
500.0000 [IU] | Freq: Once | INTRAVENOUS | Status: AC | PRN
  Administered 2015-01-20: 500 [IU]
  Filled 2015-01-20: qty 5

## 2015-01-20 MED ORDER — PALONOSETRON HCL INJECTION 0.25 MG/5ML
INTRAVENOUS | Status: AC
  Filled 2015-01-20: qty 5

## 2015-01-20 NOTE — Patient Instructions (Signed)
[facility] Discharge Instructions for Patients Receiving Chemotherapy  Today you received the following chemotherapy agents: Herceptin   To help prevent nausea and vomiting after your treatment, we encourage you to take your nausea medication as directed.    If you develop nausea and vomiting that is not controlled by your nausea medication, call the clinic.   BELOW ARE SYMPTOMS THAT SHOULD BE REPORTED IMMEDIATELY:  *FEVER GREATER THAN 100.5 F  *CHILLS WITH OR WITHOUT FEVER  NAUSEA AND VOMITING THAT IS NOT CONTROLLED WITH YOUR NAUSEA MEDICATION  *UNUSUAL SHORTNESS OF BREATH  *UNUSUAL BRUISING OR BLEEDING  TENDERNESS IN MOUTH AND THROAT WITH OR WITHOUT PRESENCE OF ULCERS  *URINARY PROBLEMS  *BOWEL PROBLEMS  UNUSUAL RASH Items with * indicate a potential emergency and should be followed up as soon as possible.  Feel free to call the clinic you have any questions or concerns. The clinic phone number is [phone].  Please show the CHEMO ALERT CARD at check-in to the Emergency Department and triage nurse.   

## 2015-01-20 NOTE — Progress Notes (Signed)
Patient Care Team: [name], MD as PCP - General (Family Medicine)  DIAGNOSIS: No matching staging information was found for the patient.  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%,HER-2 positive ratio 2.97   06/30/2014 Breast MRI Right breast 2.4 x 2.2 x 2.2 cm irregular mass, no abnormal lymph nodes   07/14/2014 - 10/28/2014 Neo-Adjuvant Chemotherapy TCH Perjeta every 3 weeks 6 followed by Herceptin maintenance   11/06/2014 Breast MRI Right breast mass has significantly decreased in size and is less masslike, maximal enhancement is 1.5 cm previously 2.4 cm   12/02/2014 Surgery Right lumpectomy: Residual IDC grade 3; 2.4 cm with DCIS high-grade, posterior margin probably positive for invasive cancer, 1/4 lymph nodes positive with extracapsular extension, T2 N1 stage IIB   12/29/2014 -  Radiation Therapy Adjuvant radiation therapy    CHIEF COMPLIANT: improvement in nausea, persistent epigastric abdominal pain and acid reflux  INTERVAL HISTORY: Marilyn Dalton is a 74 year old with above-mentioned history of right breast cancer who is currently undergoing adjuvant radiation therapy as well as Herceptin maintenance. She complains of acid reflux that is extremely severe. She is currently on pantoprazole 40 mg in the morning which has been helping her somewhat. Her symptoms are throughout the day. Nausea is slightly improved because she is on proton pump inhibitor therapy.  REVIEW OF SYSTEMS:   Constitutional: Denies fevers, chills or abnormal weight loss Eyes: Denies blurriness of vision Ears, nose, mouth, throat, and face: Denies mucositis or sore throat Respiratory: Denies cough, dyspnea or wheezes Cardiovascular: Denies palpitation, chest discomfort or lower extremity swelling Gastrointestinal:  Severe GERD and epigastric  abdominal pain Skin: Denies abnormal skin rashes Lymphatics: Denies new lymphadenopathy or easy bruising Neurological:Denies numbness, tingling or new weaknesses Behavioral/Psych: Mood is stable, no new changes  Breast: imaging of the breasts related to radiation All other systems were reviewed with the patient and are negative.  I have reviewed the past medical history, past surgical history, social history and family history with the patient and they are unchanged from previous note.  ALLERGIES:  has No Known Allergies.  MEDICATIONS:  Current Outpatient Prescriptions  Medication Sig Dispense Refill  . [START ON 12/23/2015] anastrozole (ARIMIDEX) 1 MG tablet Take 1 tablet (1 mg total) by mouth daily. 90 tablet 3  . cetirizine (ZYRTEC) 10 MG tablet Take 10 mg by mouth daily.    [name] dexamethasone (DECADRON) 4 MG tablet Take 1 tablet (4 mg total) by mouth 2 (two) times daily. Start the day before Taxotere. Then again the day after chemo for 3 days. 30 tablet 1  . diphenhydrAMINE (BENADRYL) 25 MG tablet Take 25 mg by mouth 2 (two) times daily.    [name] docusate sodium (COLACE) 100 MG capsule Take 100 mg by mouth 3 (three) times daily as needed for mild constipation (constipation).    . hyaluronate sodium (RADIAPLEXRX) GEL Apply 1 application topically 2 (two) times daily.    [name] lidocaine-prilocaine (EMLA) cream Apply to affected area once 30 g 3  . LORazepam (ATIVAN) 0.5 MG tablet Take 1 tablet (0.5 mg total) by mouth at bedtime. 30 tablet 1  . mineral oil enema Place 133 mLs (1 enema total) rectally once. 266 mL 0  . ondansetron (ZOFRAN) 8 MG tablet Take 1 tablet (8 mg total) by mouth 2 (two) times daily. Start the day after  chemo for 3 days. Then take as needed for nausea or vomiting. 30 tablet 1  . pantoprazole (PROTONIX) 40 MG tablet Take 1 tablet (40 mg total) by mouth daily. 30 tablet 3  . prochlorperazine (COMPAZINE) 10 MG tablet Take 1 tablet (10 mg total) by mouth every 6 (six) hours as  needed (Nausea or vomiting). (Patient not taking: Reported on 01/09/2015) 30 tablet 1  . promethazine (PHENERGAN) 25 MG tablet Take 1 tablet (25 mg total) by mouth every 6 (six) hours as needed for nausea or vomiting. 30 tablet 1  . senna (SENOKOT) 8.6 MG TABS tablet Take 1 tablet by mouth.    . Wound Dressings (SONAFINE) Apply 1 application topically 3 (three) times daily.     No current facility-administered medications for this visit.    PHYSICAL EXAMINATION: ECOG PERFORMANCE STATUS: 1 - Symptomatic but completely ambulatory  Filed Vitals:   01/20/15 0833  BP: 149/85  Pulse: 89  Temp: 97 F (36.1 C)  Resp: 18   Filed Weights   01/20/15 0833  Weight: 111 lb 6.4 oz (50.531 kg)    GENERAL:alert, no distress and comfortable SKIN: skin color, texture, turgor are normal, no rashes or significant lesions EYES: normal, Conjunctiva are pink and non-injected, sclera clear OROPHARYNX:no exudate, no erythema and lips, buccal mucosa, and tongue normal  NECK: supple, thyroid normal size, non-tender, without nodularity LYMPH:  no palpable lymphadenopathy in the cervical, axillary or inguinal LUNGS: clear to auscultation and percussion with normal breathing effort HEART: regular rate & rhythm and no murmurs and no lower extremity edema ABDOMEN:abdomen soft, non-tender and normal bowel sounds Musculoskeletal:no cyanosis of digits and no clubbing  NEURO: alert & oriented x 3 with fluent speech, no focal motor/sensory deficits   LABORATORY DATA:  I have reviewed the data as listed   Chemistry      Component Value Date/Time   NA 144 12/30/2014 0811   NA 140 07/18/2014 0151   K 4.7 12/30/2014 0811   K 3.6 07/18/2014 0151   CL 102 07/18/2014 0151   CO2 25 12/30/2014 0811   CO2 27 07/10/2014 1120   BUN 10.2 12/30/2014 0811   BUN 19 07/18/2014 0151   CREATININE 0.8 12/30/2014 0811   CREATININE 0.70 07/18/2014 0151      Component Value Date/Time   CALCIUM 9.8 12/30/2014 0811    CALCIUM 10.1 07/10/2014 1120   ALKPHOS 77 12/30/2014 0811   ALKPHOS 70 07/10/2014 1120   AST 24 12/30/2014 0811   AST 25 07/10/2014 1120   ALT 13 12/30/2014 0811   ALT 19 07/10/2014 1120   BILITOT 0.30 12/30/2014 0811   BILITOT 0.5 07/10/2014 1120       Lab Results  Component Value Date   WBC 5.9 01/20/2015   HGB 13.7 01/20/2015   HCT 41.8 01/20/2015   MCV 102.3* 01/20/2015   PLT 285 01/20/2015   NEUTROABS 3.7 01/20/2015   ASSESSMENT & PLAN:  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 Adjuvant radiation started 12/29/2014 2. Followed by adjuvant antiestrogen therapy with aromatase inhibitors 3. Continue every 3 week Herceptin for 1 year to be completed May 2017  Profound nausea and vomiting: Differential diagnosis gastritis versus chemotherapy related. Currently on omeprazole 40 mg daily, Phenergan as needed along with Compazine. Severe GERD: We will consult her gastroenterologist Dr. Amedeo Plenty  Adjuvant anastrozole: I discussed with her that once radiation is complete, our plan is to initiate antiestrogen therapy with anastrozole. We discussed risks and benefits of anastrozole. Patient already has lot of hot flashes and is very much afraid of more hot flashes. I instructed her to start anastrozole in January 2017.  Return to clinic in 6 weeks for follow-up on Herceptin and evaluation of her nausea and vomiting as well  as tolerability to anastrozole.  No orders of the defined types were placed in this encounter.   The patient has a good understanding of the overall plan. she agrees with it. she will call with any problems that may develop before the next visit here.   Rulon Eisenmenger, MD 01/20/2015

## 2015-01-20 NOTE — Progress Notes (Signed)
Spoke with Museum/gallery conservator at Dr. Teena Irani office [phone]).  They do not have any appt immediately available.  She will contact Dr. Amedeo Plenty assistant to request appt and their office will call the patient directly.

## 2015-01-20 NOTE — Addendum Note (Signed)
Addended by: Prentiss Bells on: 01/20/2015 09:49 AM   Modules accepted: Medications

## 2015-01-20 NOTE — Assessment & Plan Note (Signed)
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 Adjuvant radiation started 12/29/2014 2. Followed by adjuvant antiestrogen therapy with aromatase inhibitors 3. Continue every 3 week Herceptin for 1 year to be completed May 2017  Profound nausea and vomiting: Differential diagnosis gastritis versus chemotherapy related. Currently on omeprazole 40 mg daily, Phenergan as needed along with Compazine.  Return to clinic in 3 weeks for follow-up on Herceptin and evaluation of her nausea and vomiting.

## 2015-01-20 NOTE — Progress Notes (Signed)
Pt requested only 1 tylenol and 1 benadryl

## 2015-01-21 ENCOUNTER — Ambulatory Visit
Admission: RE | Admit: 2015-01-21 | Discharge: 2015-01-21 | Disposition: A | Discharge status: Home / Self Care | Payer: Medicare Other | Source: Ambulatory Visit | Attending: Radiation Oncology | Admitting: Radiation Oncology

## 2015-01-21 DIAGNOSIS — C50911 Malignant neoplasm of unspecified site of right female breast: Secondary | ICD-10-CM | POA: Diagnosis not present

## 2015-01-22 ENCOUNTER — Ambulatory Visit
Admission: RE | Admit: 2015-01-22 | Discharge: 2015-01-22 | Disposition: A | Discharge status: Home / Self Care | Payer: Medicare Other | Source: Ambulatory Visit | Attending: Radiation Oncology | Admitting: Radiation Oncology

## 2015-01-22 ENCOUNTER — Ambulatory Visit: Payer: Medicare Other

## 2015-01-22 ENCOUNTER — Other Ambulatory Visit: Payer: Self-pay

## 2015-01-22 DIAGNOSIS — C50911 Malignant neoplasm of unspecified site of right female breast: Secondary | ICD-10-CM | POA: Diagnosis present

## 2015-01-22 DIAGNOSIS — Z510 Encounter for antineoplastic radiation therapy: Secondary | ICD-10-CM | POA: Diagnosis present

## 2015-01-23 ENCOUNTER — Ambulatory Visit
Admission: RE | Admit: 2015-01-23 | Discharge: 2015-01-23 | Disposition: A | Discharge status: Home / Self Care | Payer: Medicare Other | Source: Ambulatory Visit | Attending: Radiation Oncology | Admitting: Radiation Oncology

## 2015-01-23 ENCOUNTER — Ambulatory Visit: Payer: Medicare Other

## 2015-01-23 ENCOUNTER — Encounter: Payer: Self-pay | Admitting: Radiation Oncology

## 2015-01-23 ENCOUNTER — Other Ambulatory Visit: Payer: Self-pay | Admitting: Gastroenterology

## 2015-01-23 VITALS — BP 153/90 | HR 80 | Temp 97.6°F | Ht 65.0 in | Wt 113.1 lb

## 2015-01-23 DIAGNOSIS — Z510 Encounter for antineoplastic radiation therapy: Secondary | ICD-10-CM | POA: Diagnosis not present

## 2015-01-23 DIAGNOSIS — C50211 Malignant neoplasm of upper-inner quadrant of right female breast: Secondary | ICD-10-CM

## 2015-01-23 NOTE — Progress Notes (Signed)
Department of Radiation Oncology  Phone:  [phone] Fax:        [phone]  Weekly Treatment Note    Name: Marilyn Dalton Date: 01/23/2015 MRN: [id number] DOB: [birth year]   Current dose: 47.5 Gy  Current fraction: 19   MEDICATIONS: Current Outpatient Prescriptions  Medication Sig Dispense Refill  . pantoprazole (PROTONIX) 40 MG tablet Take 1 tablet (40 mg total) by mouth daily. 30 tablet 3  . Wound Dressings (SONAFINE) Apply 1 application topically 3 (three) times daily.    Derrill Memo ON 12/23/2015] anastrozole (ARIMIDEX) 1 MG tablet Take 1 tablet (1 mg total) by mouth daily. (Patient not taking: Reported on 01/23/2015) 90 tablet 3  . cetirizine (ZYRTEC) 10 MG tablet Take 10 mg by mouth daily.    [name] dexamethasone (DECADRON) 4 MG tablet Take 1 tablet (4 mg total) by mouth 2 (two) times daily. Start the day before Taxotere. Then again the day after chemo for 3 days. (Patient not taking: Reported on 01/23/2015) 30 tablet 1  . diphenhydrAMINE (BENADRYL) 25 MG tablet Take 25 mg by mouth 2 (two) times daily.    [name] docusate sodium (COLACE) 100 MG capsule Take 100 mg by mouth 3 (three) times daily as needed for mild constipation (constipation).    . hyaluronate sodium (RADIAPLEXRX) GEL Apply 1 application topically 2 (two) times daily.    [name] lidocaine-prilocaine (EMLA) cream Apply to affected area once (Patient not taking: Reported on 01/23/2015) 30 g 3  . LORazepam (ATIVAN) 0.5 MG tablet Take 1 tablet (0.5 mg total) by mouth at bedtime. (Patient not taking: Reported on 01/23/2015) 30 tablet 1  . mineral oil enema Place 133 mLs (1 enema total) rectally once. (Patient not taking: Reported on 01/23/2015) 266 mL 0  . ondansetron (ZOFRAN) 8 MG tablet Take 1 tablet (8 mg total) by mouth 2 (two) times daily. Start the day after chemo for 3 days. Then take as needed for nausea or vomiting. (Patient not taking: Reported on 01/23/2015) 30 tablet 1  . prochlorperazine (COMPAZINE) 10 MG tablet Take 1  tablet (10 mg total) by mouth every 6 (six) hours as needed (Nausea or vomiting). (Patient not taking: Reported on 01/23/2015) 30 tablet 1  . promethazine (PHENERGAN) 25 MG tablet Take 1 tablet (25 mg total) by mouth every 6 (six) hours as needed for nausea or vomiting. 30 tablet 1  . senna (SENOKOT) 8.6 MG TABS tablet Take 1 tablet by mouth.     No current facility-administered medications for this encounter.     ALLERGIES: Review of patient's allergies indicates no known allergies.   LABORATORY DATA:  Lab Results  Component Value Date   WBC 5.9 01/20/2015   HGB 13.7 01/20/2015   HCT 41.8 01/20/2015   MCV 102.3* 01/20/2015   PLT 285 01/20/2015   Lab Results  Component Value Date   NA 141 01/20/2015   K 4.2 01/20/2015   CL 102 07/18/2014   CO2 25 01/20/2015   Lab Results  Component Value Date   ALT <9 01/20/2015   AST 18 01/20/2015   ALKPHOS 67 01/20/2015   BILITOT 0.42 01/20/2015     NARRATIVE: Marilyn Dalton was seen today for weekly treatment management. The chart was checked and the patient's films were reviewed.  Weekly rad txs 19/20 right breast completed. States she is very tired today, usually gets tired during the day. Appetite is good. Skin has redness to right breast using. Patient is using Sonafene cream to breast.  1:28  PM BP 153/90 mmHg  Pulse 80  Temp(Src) 97.6 F (36.4 C) (Oral)  Ht 5\' 5"  (1.651 m)  Wt 113 lb 1.6 oz (51.302 kg)  BMI 18.82 kg/m2  SpO2 98%  Wt Readings from Last 3 Encounters:  01/23/15 113 lb 1.6 oz (51.302 kg)  01/20/15 111 lb 6.4 oz (50.531 kg)  01/13/15 113 lb 6.4 oz (51.438 kg)    PHYSICAL EXAMINATION: height is 5\' 5"  (1.651 m) and weight is 113 lb 1.6 oz (51.302 kg). Her oral temperature is 97.6 F (36.4 C). Her blood pressure is 153/90 and her pulse is 80. Her oxygen saturation is 98%.    Some diffuse erythema in the treatment area with fairly mild dermtitis in the upper aspect. No desquamation.  ASSESSMENT: The  patient is doing satisfactorily with treatment.  PLAN: We will continue with the patient's radiation treatment as planned. She will complete radiation on next week, 12/5. She will follow up in a month.       This document serves as a record of services personally performed by Tyler Pita, MD. It was created on his behalf by Lendon Collar, a trained medical scribe. The creation of this record is based on the scribe's personal observations and the provider's statements to them. This document has been checked and approved by the attending provider.

## 2015-01-23 NOTE — Progress Notes (Signed)
  Radiation Oncology         [phone] ________________________________  Name: Marilyn Dalton MRN: [id number]  Date: 12/29/2014  DOB: 13-Jun-1940  Complex simulation note  The patient has undergone complex simulation for her upcoming boost treatment for her diagnosis of breast cancer. The patient has initially been planned to receive 42.5 Gy. The patient will now receive a 7.5 Gy boost to the seroma cavity which has been contoured. This will be accomplished using an en face electron field. Based on the depth of the target area, 9 MeV electrons will be used. The patient's final total dose therefore will be 50 Gy. A complex isodose plan is requested for the boost treatment.   _______________________________  Jodelle Gross, MD, PhD

## 2015-01-23 NOTE — Progress Notes (Signed)
Marilyn Dalton has received 19 fractions.  States she is very tired today, usually gets tired as the during the day.  Appetite is good.  Skin has redness to right breast  using Sonafene cream to breast.    BP 153/90 mmHg  Pulse 80  Temp(Src) 97.6 F (36.4 C) (Oral)  Ht 5\' 5"  (1.651 m)  Wt 113 lb 1.6 oz (51.302 kg)  BMI 18.82 kg/m2  SpO2 98%  Wt Readings from Last 3 Encounters:  01/23/15 113 lb 1.6 oz (51.302 kg)  01/20/15 111 lb 6.4 oz (50.531 kg)  01/13/15 113 lb 6.4 oz (51.438 kg)

## 2015-01-26 ENCOUNTER — Telehealth: Payer: Self-pay | Admitting: *Deleted

## 2015-01-26 ENCOUNTER — Encounter: Payer: Self-pay | Admitting: Radiation Oncology

## 2015-01-26 ENCOUNTER — Ambulatory Visit: Payer: Medicare Other

## 2015-01-26 ENCOUNTER — Ambulatory Visit
Admission: RE | Admit: 2015-01-26 | Discharge: 2015-01-26 | Disposition: A | Discharge status: Home / Self Care | Payer: Medicare Other | Source: Ambulatory Visit | Attending: Radiation Oncology | Admitting: Radiation Oncology

## 2015-01-26 DIAGNOSIS — Z510 Encounter for antineoplastic radiation therapy: Secondary | ICD-10-CM | POA: Diagnosis not present

## 2015-01-26 NOTE — Telephone Encounter (Signed)
Left message for a return phone call to follow up after radiation completion.  

## 2015-01-27 ENCOUNTER — Ambulatory Visit: Payer: Medicare Other

## 2015-01-28 ENCOUNTER — Encounter: Payer: Self-pay | Admitting: Hematology and Oncology

## 2015-01-28 ENCOUNTER — Ambulatory Visit (HOSPITAL_BASED_OUTPATIENT_CLINIC_OR_DEPARTMENT_OTHER): Payer: Medicare Other | Admitting: Hematology and Oncology

## 2015-01-28 ENCOUNTER — Telehealth: Payer: Self-pay | Admitting: Hematology and Oncology

## 2015-01-28 VITALS — BP 124/73 | HR 91 | Temp 97.9°F | Resp 18 | Ht 65.0 in | Wt 108.7 lb

## 2015-01-28 DIAGNOSIS — D721 Eosinophilia: Secondary | ICD-10-CM | POA: Diagnosis not present

## 2015-01-28 DIAGNOSIS — R110 Nausea: Secondary | ICD-10-CM

## 2015-01-28 DIAGNOSIS — C50211 Malignant neoplasm of upper-inner quadrant of right female breast: Secondary | ICD-10-CM | POA: Diagnosis present

## 2015-01-28 DIAGNOSIS — K2970 Gastritis, unspecified, without bleeding: Secondary | ICD-10-CM | POA: Diagnosis not present

## 2015-01-28 DIAGNOSIS — C773 Secondary and unspecified malignant neoplasm of axilla and upper limb lymph nodes: Secondary | ICD-10-CM | POA: Diagnosis not present

## 2015-01-28 DIAGNOSIS — R530 Neoplastic (malignant) related fatigue: Secondary | ICD-10-CM

## 2015-01-28 NOTE — Telephone Encounter (Signed)
Appointments already made and in place

## 2015-01-28 NOTE — Progress Notes (Signed)
Patient Care Team: [name], MD as PCP - General (Family Medicine)  DIAGNOSIS: No matching staging information was found for the patient.  SUMMARY OF ONCOLOGIC HISTORY:   Breast cancer of upper-inner quadrant of right female breast ([place])   06/20/2014 Initial Diagnosis right breast 3:00: Invasive ductal carcinoma with DCIS, grade 2,ER 90%, PR 1%,HER-2 positive ratio 2.97   06/30/2014 Breast MRI Right breast 2.4 x 2.2 x 2.2 cm irregular mass, no abnormal lymph nodes   07/14/2014 - 10/28/2014 Neo-Adjuvant Chemotherapy TCH Perjeta every 3 weeks 6 followed by Herceptin maintenance   11/06/2014 Breast MRI Right breast mass has significantly decreased in size and is less masslike, maximal enhancement is 1.5 cm previously 2.4 cm   12/02/2014 Surgery Right lumpectomy: Residual IDC grade 3; 2.4 cm with DCIS high-grade, posterior margin probably positive for invasive cancer, 1/4 lymph nodes positive with extracapsular extension, T2 N1 stage IIB   12/29/2014 - 01/26/2015 Radiation Therapy Adjuvant radiation therapy    CHIEF COMPLIANT: follow-up after recent endoscopy  INTERVAL HISTORY: Marilyn Dalton is a 74 year old with above-mentioned history right breast cancer treated with neoadjuvant chemotherapy followed by lumpectomy and recently completed radiation therapy. She had a lot of gastritis and I put her on Protonix and she was referred to gastroenterology. She had upper endoscopy which revealed gastritis and she was started on prednisone therapy as of today. She also continues on Protonix. She was told that she does not have esophagitis or duodenal inflammation. 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  REVIEW OF SYSTEMS:   Constitutional: Denies fevers, chills or abnormal weight loss Eyes: Denies blurriness of vision Ears, nose, mouth, throat, and face: Denies mucositis or sore throat Respiratory: Denies cough, dyspnea or wheezes Cardiovascular: Denies  palpitation, chest discomfort or lower extremity swelling Gastrointestinal:  Denies nausea, heartburn or change in bowel habits Skin: Denies abnormal skin rashes Lymphatics: Denies new lymphadenopathy or easy bruising Neurological:Denies numbness, tingling or new weaknesses Behavioral/Psych: Mood is stable, no new changes  Breast:  denies any pain or lumps or nodules in either breasts, the port site appears to be tender. All other systems were reviewed with the patient and are negative.  I have reviewed the past medical history, past surgical history, social history and family history with the patient and they are unchanged from previous note.  ALLERGIES:  has No Known Allergies.  MEDICATIONS:  Current Outpatient Prescriptions  Medication Sig Dispense Refill  . [START ON 12/23/2015] anastrozole (ARIMIDEX) 1 MG tablet Take 1 tablet (1 mg total) by mouth daily. (Patient not taking: Reported on 01/23/2015) 90 tablet 3  . cetirizine (ZYRTEC) 10 MG tablet Take 10 mg by mouth daily.    [name] dexamethasone (DECADRON) 4 MG tablet Take 1 tablet (4 mg total) by mouth 2 (two) times daily. Start the day before Taxotere. Then again the day after chemo for 3 days. (Patient not taking: Reported on 01/23/2015) 30 tablet 1  . diphenhydrAMINE (BENADRYL) 25 MG tablet Take 25 mg by mouth 2 (two) times daily.    [name] docusate sodium (COLACE) 100 MG capsule Take 100 mg by mouth 3 (three) times daily as needed for mild constipation (constipation).    . hyaluronate sodium (RADIAPLEXRX) GEL Apply 1 application topically 2 (two) times daily.    [name] lidocaine-prilocaine (EMLA) cream Apply to affected area once (Patient not taking: Reported on 01/23/2015) 30 g 3  . LORazepam (ATIVAN) 0.5 MG tablet Take 1 tablet (0.5 mg total) by mouth at bedtime. (  Patient not taking: Reported on 01/23/2015) 30 tablet 1  . mineral oil enema Place 133 mLs (1 enema total) rectally once. (Patient not taking: Reported on 01/23/2015) 266 mL 0  .  ondansetron (ZOFRAN) 8 MG tablet Take 1 tablet (8 mg total) by mouth 2 (two) times daily. Start the day after chemo for 3 days. Then take as needed for nausea or vomiting. (Patient not taking: Reported on 01/23/2015) 30 tablet 1  . pantoprazole (PROTONIX) 40 MG tablet Take 1 tablet (40 mg total) by mouth daily. 30 tablet 3  . prochlorperazine (COMPAZINE) 10 MG tablet Take 1 tablet (10 mg total) by mouth every 6 (six) hours as needed (Nausea or vomiting). (Patient not taking: Reported on 01/23/2015) 30 tablet 1  . promethazine (PHENERGAN) 25 MG tablet Take 1 tablet (25 mg total) by mouth every 6 (six) hours as needed for nausea or vomiting. 30 tablet 1  . senna (SENOKOT) 8.6 MG TABS tablet Take 1 tablet by mouth.    . Wound Dressings (SONAFINE) Apply 1 application topically 3 (three) times daily.     No current facility-administered medications for this visit.    PHYSICAL EXAMINATION: ECOG PERFORMANCE STATUS: 1 - Symptomatic but completely ambulatory  Filed Vitals:   01/28/15 0909  BP: 124/73  Pulse: 91  Temp: 97.9 F (36.6 C)  Resp: 18   Filed Weights   01/28/15 0909  Weight: 108 lb 11.2 oz (49.306 kg)    GENERAL:alert, no distress and comfortable SKIN: skin color, texture, turgor are normal, no rashes or significant lesions EYES: normal, Conjunctiva are pink and non-injected, sclera clear OROPHARYNX:no exudate, no erythema and lips, buccal mucosa, and tongue normal  NECK: supple, thyroid normal size, non-tender, without nodularity LYMPH:  no palpable lymphadenopathy in the cervical, axillary or inguinal LUNGS: clear to auscultation and percussion with normal breathing effort HEART: regular rate & rhythm and no murmurs and no lower extremity edema ABDOMEN:abdomen soft, non-tender and normal bowel sounds Musculoskeletal:no cyanosis of digits and no clubbing  NEURO: alert & oriented x 3 with fluent speech, no focal motor/sensory deficits  LABORATORY DATA:  I have reviewed the data  as listed   Chemistry      Component Value Date/Time   NA 141 01/20/2015 0820   NA 140 07/18/2014 0151   K 4.2 01/20/2015 0820   K 3.6 07/18/2014 0151   CL 102 07/18/2014 0151   CO2 25 01/20/2015 0820   CO2 27 07/10/2014 1120   BUN 9.4 01/20/2015 0820   BUN 19 07/18/2014 0151   CREATININE 0.8 01/20/2015 0820   CREATININE 0.70 07/18/2014 0151      Component Value Date/Time   CALCIUM 9.8 01/20/2015 0820   CALCIUM 10.1 07/10/2014 1120   ALKPHOS 67 01/20/2015 0820   ALKPHOS 70 07/10/2014 1120   AST 18 01/20/2015 0820   AST 25 07/10/2014 1120   ALT <9 01/20/2015 0820   ALT 19 07/10/2014 1120   BILITOT 0.42 01/20/2015 0820   BILITOT 0.5 07/10/2014 1120       Lab Results  Component Value Date   WBC 5.9 01/20/2015   HGB 13.7 01/20/2015   HCT 41.8 01/20/2015   MCV 102.3* 01/20/2015   PLT 285 01/20/2015   NEUTROABS 3.7 01/20/2015   ASSESSMENT & PLAN:  Breast cancer of upper-inner quadrant of right female 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, 2.4 x 2.2 x 2.2 cm mass which abuts the pectoral muscle, grade 2, invasive ductal carcinoma, ER  90%, PR 1%, Ki-67 20%, HER-2 positive ratio 2.97, T2 N0 M0 stage II A clinical stage Status post TCH Perjeta 6 cycles started 07/14/2014 completed 10/28/2014 Right lumpectomy 12/02/2014: Residual IDC grade 3; 2.4 cm with DCIS high-grade, posterior margin probably positive for invasive cancer, 1/4 lymph nodes positive with extracapsular extension, T2 N1 stage IIB Adj XRT completed 01/26/15  Current treatment: 1. Adjuvant antiestrogen therapy withanastrozole to start January 2017 2. 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 She was prescribed prednisone. I encouraged her to continue with Protonix as well.  Return to clinic January 10 for follow-up and evaluation on anastrozole and for  Herceptin.    No orders of the defined types were placed in this encounter.   The patient has a good understanding of the overall plan. she agrees with it. she will call with any problems that may develop before the next visit here.   Rulon Eisenmenger, MD 01/28/2015

## 2015-01-28 NOTE — Assessment & Plan Note (Signed)
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 Adjuvant antiestrogen therapy withanastrozole to start January 2017 2. 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 She was prescribed prednisone. I encouraged her to continue with Protonix as well.  Return to clinic January 10 for follow-up and evaluation on anastrozole and for Herceptin.

## 2015-02-04 NOTE — Progress Notes (Signed)
  Radiation Oncology         [phone] ________________________________  Name: Marilyn Dalton MRN: [id number]  Date: 01/26/2015  DOB: 1940-08-22  End of Treatment Note  Diagnosis:   Right-sided breast cancer     Indication for treatment:  Curative       Radiation treatment dates:   12/29/2014 through 01/26/2015  Site/dose:   The patient initially received a dose of 42.5 Gy in 17 fractions to the breast using whole-breast tangent fields. This was delivered using a 3-D conformal technique. The patient then received a boost to the seroma. This delivered an additional 7.5 Gy in 3 fractions using an en face electron field due to the depth of the seroma. The total dose was 50 Gy.  Narrative: The patient tolerated radiation treatment relatively well.   The patient had some expected skin irritation as she progressed during treatment. Moist desquamation was not present at the end of treatment.  Plan: The patient has completed radiation treatment. The patient will return to radiation oncology clinic for routine followup in one month. I advised the patient to call or return sooner if they have any questions or concerns related to their recovery or treatment. ________________________________  Jodelle Gross, M.D., Ph.D.

## 2015-02-09 ENCOUNTER — Other Ambulatory Visit: Payer: Self-pay | Admitting: *Deleted

## 2015-02-10 ENCOUNTER — Encounter: Payer: Self-pay | Admitting: *Deleted

## 2015-02-10 ENCOUNTER — Other Ambulatory Visit: Payer: Medicare Other

## 2015-02-10 ENCOUNTER — Ambulatory Visit (HOSPITAL_BASED_OUTPATIENT_CLINIC_OR_DEPARTMENT_OTHER): Payer: Medicare Other

## 2015-02-10 VITALS — BP 157/81 | HR 87 | Temp 98.0°F | Resp 19

## 2015-02-10 DIAGNOSIS — Z5112 Encounter for antineoplastic immunotherapy: Secondary | ICD-10-CM

## 2015-02-10 DIAGNOSIS — C50211 Malignant neoplasm of upper-inner quadrant of right female breast: Secondary | ICD-10-CM

## 2015-02-10 MED ORDER — PALONOSETRON HCL INJECTION 0.25 MG/5ML
INTRAVENOUS | Status: AC
  Filled 2015-02-10: qty 5

## 2015-02-10 MED ORDER — SODIUM CHLORIDE 0.9 % IV SOLN
Freq: Once | INTRAVENOUS | Status: AC
  Administered 2015-02-10: 09:00:00 via INTRAVENOUS

## 2015-02-10 MED ORDER — HEPARIN SOD (PORK) LOCK FLUSH 100 UNIT/ML IV SOLN
500.0000 [IU] | Freq: Once | INTRAVENOUS | Status: DC | PRN
  Filled 2015-02-10: qty 5

## 2015-02-10 MED ORDER — ACETAMINOPHEN 325 MG PO TABS
650.0000 mg | ORAL_TABLET | Freq: Once | ORAL | Status: AC
  Administered 2015-02-10: 325 mg via ORAL

## 2015-02-10 MED ORDER — PALONOSETRON HCL INJECTION 0.25 MG/5ML
0.2500 mg | Freq: Once | INTRAVENOUS | Status: AC
  Administered 2015-02-10: 0.25 mg via INTRAVENOUS

## 2015-02-10 MED ORDER — DIPHENHYDRAMINE HCL 25 MG PO CAPS
50.0000 mg | ORAL_CAPSULE | Freq: Once | ORAL | Status: AC
  Administered 2015-02-10: 25 mg via ORAL

## 2015-02-10 MED ORDER — DIPHENHYDRAMINE HCL 25 MG PO CAPS
ORAL_CAPSULE | ORAL | Status: AC
  Filled 2015-02-10: qty 1

## 2015-02-10 MED ORDER — TRASTUZUMAB CHEMO INJECTION 440 MG
6.0000 mg/kg | Freq: Once | INTRAVENOUS | Status: AC
  Administered 2015-02-10: 294 mg via INTRAVENOUS
  Filled 2015-02-10: qty 14

## 2015-02-10 MED ORDER — SODIUM CHLORIDE 0.9 % IJ SOLN
10.0000 mL | INTRAMUSCULAR | Status: DC | PRN
  Filled 2015-02-10: qty 10

## 2015-02-10 MED ORDER — ACETAMINOPHEN 325 MG PO TABS
ORAL_TABLET | ORAL | Status: AC
  Filled 2015-02-10: qty 1

## 2015-02-10 NOTE — Patient Instructions (Signed)
[facility] Discharge Instructions for Patients Receiving Chemotherapy  Today you received the following chemotherapy agents: Herceptin   To help prevent nausea and vomiting after your treatment, we encourage you to take your nausea medication as directed.    If you develop nausea and vomiting that is not controlled by your nausea medication, call the clinic.   BELOW ARE SYMPTOMS THAT SHOULD BE REPORTED IMMEDIATELY:  *FEVER GREATER THAN 100.5 F  *CHILLS WITH OR WITHOUT FEVER  NAUSEA AND VOMITING THAT IS NOT CONTROLLED WITH YOUR NAUSEA MEDICATION  *UNUSUAL SHORTNESS OF BREATH  *UNUSUAL BRUISING OR BLEEDING  TENDERNESS IN MOUTH AND THROAT WITH OR WITHOUT PRESENCE OF ULCERS  *URINARY PROBLEMS  *BOWEL PROBLEMS  UNUSUAL RASH Items with * indicate a potential emergency and should be followed up as soon as possible.  Feel free to call the clinic you have any questions or concerns. The clinic phone number is [phone].  Please show the CHEMO ALERT CARD at check-in to the Emergency Department and triage nurse.   

## 2015-02-13 ENCOUNTER — Telehealth: Payer: Self-pay | Admitting: *Deleted

## 2015-02-13 ENCOUNTER — Other Ambulatory Visit: Payer: Self-pay | Admitting: *Deleted

## 2015-02-13 DIAGNOSIS — C50211 Malignant neoplasm of upper-inner quadrant of right female breast: Secondary | ICD-10-CM

## 2015-02-13 NOTE — Telephone Encounter (Signed)
Left message for staff to please call the pt and schedule her echo.

## 2015-02-19 ENCOUNTER — Ambulatory Visit (HOSPITAL_COMMUNITY)
Admission: RE | Admit: 2015-02-19 | Discharge: 2015-02-19 | Disposition: A | Discharge status: Home / Self Care | Payer: Medicare Other | Source: Ambulatory Visit | Attending: Hematology and Oncology | Admitting: Hematology and Oncology

## 2015-02-19 DIAGNOSIS — C50211 Malignant neoplasm of upper-inner quadrant of right female breast: Secondary | ICD-10-CM | POA: Insufficient documentation

## 2015-02-19 DIAGNOSIS — I951 Orthostatic hypotension: Secondary | ICD-10-CM | POA: Diagnosis not present

## 2015-02-19 DIAGNOSIS — Z720 Tobacco use: Secondary | ICD-10-CM | POA: Diagnosis not present

## 2015-03-03 ENCOUNTER — Encounter: Payer: Self-pay | Admitting: *Deleted

## 2015-03-03 ENCOUNTER — Ambulatory Visit (HOSPITAL_BASED_OUTPATIENT_CLINIC_OR_DEPARTMENT_OTHER): Payer: Medicare Other

## 2015-03-03 ENCOUNTER — Encounter: Payer: Self-pay | Admitting: Hematology and Oncology

## 2015-03-03 ENCOUNTER — Other Ambulatory Visit: Payer: Self-pay

## 2015-03-03 ENCOUNTER — Other Ambulatory Visit (HOSPITAL_BASED_OUTPATIENT_CLINIC_OR_DEPARTMENT_OTHER): Payer: Medicare Other

## 2015-03-03 ENCOUNTER — Ambulatory Visit (HOSPITAL_BASED_OUTPATIENT_CLINIC_OR_DEPARTMENT_OTHER): Payer: Medicare Other | Admitting: Hematology and Oncology

## 2015-03-03 VITALS — BP 142/85 | HR 83 | Temp 98.0°F | Resp 18 | Wt 114.3 lb

## 2015-03-03 DIAGNOSIS — Z5112 Encounter for antineoplastic immunotherapy: Secondary | ICD-10-CM

## 2015-03-03 DIAGNOSIS — N951 Menopausal and female climacteric states: Secondary | ICD-10-CM | POA: Diagnosis not present

## 2015-03-03 DIAGNOSIS — R530 Neoplastic (malignant) related fatigue: Secondary | ICD-10-CM

## 2015-03-03 DIAGNOSIS — K2970 Gastritis, unspecified, without bleeding: Secondary | ICD-10-CM | POA: Diagnosis not present

## 2015-03-03 DIAGNOSIS — C50211 Malignant neoplasm of upper-inner quadrant of right female breast: Secondary | ICD-10-CM

## 2015-03-03 LAB — CBC WITH DIFFERENTIAL/PLATELET
BASO%: 0.9 % (ref 0.0–2.0)
Basophils Absolute: 0.1 10*3/uL (ref 0.0–0.1)
EOS ABS: 0.1 10*3/uL (ref 0.0–0.5)
EOS%: 1.9 % (ref 0.0–7.0)
HCT: 43 % (ref 34.8–46.6)
HEMOGLOBIN: 13.9 g/dL (ref 11.6–15.9)
LYMPH%: 18.9 % (ref 14.0–49.7)
MCH: 31.8 pg (ref 25.1–34.0)
MCHC: 32.4 g/dL (ref 31.5–36.0)
MCV: 98.2 fL (ref 79.5–101.0)
MONO#: 0.5 10*3/uL (ref 0.1–0.9)
MONO%: 9.4 % (ref 0.0–14.0)
NEUT%: 68.9 % (ref 38.4–76.8)
NEUTROS ABS: 3.7 10*3/uL (ref 1.5–6.5)
PLATELETS: 251 10*3/uL (ref 145–400)
RBC: 4.38 10*6/uL (ref 3.70–5.45)
RDW: 15.6 % — AB (ref 11.2–14.5)
WBC: 5.3 10*3/uL (ref 3.9–10.3)
lymph#: 1 10*3/uL (ref 0.9–3.3)

## 2015-03-03 LAB — COMPREHENSIVE METABOLIC PANEL
ALBUMIN: 4 g/dL (ref 3.5–5.0)
ALK PHOS: 67 U/L (ref 40–150)
ALT: 15 U/L (ref 0–55)
AST: 22 U/L (ref 5–34)
Anion Gap: 10 mEq/L (ref 3–11)
BUN: 8.1 mg/dL (ref 7.0–26.0)
CO2: 25 meq/L (ref 22–29)
CREATININE: 0.8 mg/dL (ref 0.6–1.1)
Calcium: 9.9 mg/dL (ref 8.4–10.4)
Chloride: 108 mEq/L (ref 98–109)
EGFR: 70 mL/min/{1.73_m2} — AB (ref >90)
GLUCOSE: 94 mg/dL (ref 70–140)
Potassium: 4.7 mEq/L (ref 3.5–5.1)
SODIUM: 143 meq/L (ref 136–145)
TOTAL PROTEIN: 7.3 g/dL (ref 6.4–8.3)
Total Bilirubin: 0.44 mg/dL (ref 0.20–1.20)

## 2015-03-03 MED ORDER — DIPHENHYDRAMINE HCL 25 MG PO CAPS
50.0000 mg | ORAL_CAPSULE | Freq: Once | ORAL | Status: AC
  Administered 2015-03-03: 25 mg via ORAL

## 2015-03-03 MED ORDER — PALONOSETRON HCL INJECTION 0.25 MG/5ML
INTRAVENOUS | Status: AC
  Filled 2015-03-03: qty 5

## 2015-03-03 MED ORDER — HEPARIN SOD (PORK) LOCK FLUSH 100 UNIT/ML IV SOLN
500.0000 [IU] | Freq: Once | INTRAVENOUS | Status: AC | PRN
  Administered 2015-03-03: 500 [IU]
  Filled 2015-03-03: qty 5

## 2015-03-03 MED ORDER — ACETAMINOPHEN 325 MG PO TABS
ORAL_TABLET | ORAL | Status: AC
  Filled 2015-03-03: qty 2

## 2015-03-03 MED ORDER — SODIUM CHLORIDE 0.9 % IJ SOLN
10.0000 mL | INTRAMUSCULAR | Status: DC | PRN
  Administered 2015-03-03: 10 mL
  Filled 2015-03-03: qty 10

## 2015-03-03 MED ORDER — SODIUM CHLORIDE 0.9 % IV SOLN
6.0000 mg/kg | Freq: Once | INTRAVENOUS | Status: AC
  Administered 2015-03-03: 294 mg via INTRAVENOUS
  Filled 2015-03-03: qty 14

## 2015-03-03 MED ORDER — DIPHENHYDRAMINE HCL 25 MG PO CAPS
ORAL_CAPSULE | ORAL | Status: AC
  Filled 2015-03-03: qty 2

## 2015-03-03 MED ORDER — PALONOSETRON HCL INJECTION 0.25 MG/5ML
0.2500 mg | Freq: Once | INTRAVENOUS | Status: AC
  Administered 2015-03-03: 0.25 mg via INTRAVENOUS

## 2015-03-03 MED ORDER — ACETAMINOPHEN 325 MG PO TABS
650.0000 mg | ORAL_TABLET | Freq: Once | ORAL | Status: AC
  Administered 2015-03-03: 650 mg via ORAL

## 2015-03-03 MED ORDER — SODIUM CHLORIDE 0.9 % IV SOLN
Freq: Once | INTRAVENOUS | Status: AC
  Administered 2015-03-03: 09:00:00 via INTRAVENOUS

## 2015-03-03 NOTE — Assessment & Plan Note (Signed)
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 Adjuvant antiestrogen therapy with anastrozole started January 2017 2. 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  Anastrozole toxicities:  Return to clinic in 6 weeks for follow-up and every 3 weeks for Herceptin

## 2015-03-03 NOTE — Patient Instructions (Signed)
[facility] Discharge Instructions for Patients Receiving Chemotherapy  Today you received the following chemotherapy agents:  Herceptin  To help prevent nausea and vomiting after your treatment, we encourage you to take your nausea medication as prescribed.   If you develop nausea and vomiting that is not controlled by your nausea medication, call the clinic.   BELOW ARE SYMPTOMS THAT SHOULD BE REPORTED IMMEDIATELY:  *FEVER GREATER THAN 100.5 F  *CHILLS WITH OR WITHOUT FEVER  NAUSEA AND VOMITING THAT IS NOT CONTROLLED WITH YOUR NAUSEA MEDICATION  *UNUSUAL SHORTNESS OF BREATH  *UNUSUAL BRUISING OR BLEEDING  TENDERNESS IN MOUTH AND THROAT WITH OR WITHOUT PRESENCE OF ULCERS  *URINARY PROBLEMS  *BOWEL PROBLEMS  UNUSUAL RASH Items with * indicate a potential emergency and should be followed up as soon as possible.  Feel free to call the clinic you have any questions or concerns. The clinic phone number is [phone].  Please show the CHEMO ALERT CARD at check-in to the Emergency Department and triage nurse.   

## 2015-03-03 NOTE — Progress Notes (Signed)
Patient Care Team: [name], MD as PCP - General (Family Medicine)  DIAGNOSIS: No matching staging information was found for the patient.  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%,HER-2 positive ratio 2.97   06/30/2014 Breast MRI Right breast 2.4 x 2.2 x 2.2 cm irregular mass, no abnormal lymph nodes   07/14/2014 - 10/28/2014 Neo-Adjuvant Chemotherapy TCH Perjeta every 3 weeks 6 followed by Herceptin maintenance   11/06/2014 Breast MRI Right breast mass has significantly decreased in size and is less masslike, maximal enhancement is 1.5 cm previously 2.4 cm   12/02/2014 Surgery Right lumpectomy: Residual IDC grade 3; 2.4 cm with DCIS high-grade, posterior margin probably positive for invasive cancer, 1/4 lymph nodes positive with extracapsular extension, T2 N1 stage IIB   12/29/2014 - 01/26/2015 Radiation Therapy Adjuvant radiation therapy    CHIEF COMPLIANT: Follow-up on Herceptin  INTERVAL HISTORY: Marilyn Dalton is a 75 year old with above-mentioned history of right breast cancer treated with lumpectomy after neoadjuvant chemotherapy. She finished adjuvant radiation therapy. She started anastrozole January 2017 appears to be tolerating it fairly well. She has hot flashes which are no different than before. She has occasional muscle twitching sweeping the shoulder blades as well as still decreased taste in her mouth.  REVIEW OF SYSTEMS:   Constitutional: Denies fevers, chills or abnormal weight loss Eyes: Denies blurriness of vision Ears, nose, mouth, throat, and face: Denies mucositis or sore throat Respiratory: Denies cough, dyspnea or wheezes Cardiovascular: Denies palpitation, chest discomfort Gastrointestinal:  Denies nausea, heartburn or change in bowel habits Skin: Denies abnormal skin rashes Lymphatics:  Denies new lymphadenopathy or easy bruising Neurological:Denies numbness, tingling or new weaknesses Behavioral/Psych: Mood is stable, no new changes  Extremities: No lower extremity edema All other systems were reviewed with the patient and are negative.  I have reviewed the past medical history, past surgical history, social history and family history with the patient and they are unchanged from previous note.  ALLERGIES:  has No Known Allergies.  MEDICATIONS:  Current Outpatient Prescriptions  Medication Sig Dispense Refill  . [START ON 12/23/2015] anastrozole (ARIMIDEX) 1 MG tablet Take 1 tablet (1 mg total) by mouth daily. (Patient not taking: Reported on 01/23/2015) 90 tablet 3  . cetirizine (ZYRTEC) 10 MG tablet Take 10 mg by mouth daily.    . diphenhydrAMINE (BENADRYL) 25 MG tablet Take 25 mg by mouth 2 (two) times daily.    [name] docusate sodium (COLACE) 100 MG capsule Take 100 mg by mouth 3 (three) times daily as needed for mild constipation (constipation).    . hyaluronate sodium (RADIAPLEXRX) GEL Apply 1 application topically 2 (two) times daily.    [name] lidocaine-prilocaine (EMLA) cream Apply to affected area once (Patient not taking: Reported on 01/23/2015) 30 g 3  . LORazepam (ATIVAN) 0.5 MG tablet Take 1 tablet (0.5 mg total) by mouth at bedtime. (Patient not taking: Reported on 01/23/2015) 30 tablet 1  . mineral oil enema Place 133 mLs (1 enema total) rectally once. (Patient not taking: Reported on 01/23/2015) 266 mL 0  . ondansetron (ZOFRAN) 8 MG tablet Take 1 tablet (8 mg total) by mouth 2 (two) times daily. Start the day after chemo for 3 days. Then take as needed for nausea or vomiting. (Patient not taking: Reported on 01/23/2015) 30 tablet 1  . pantoprazole (PROTONIX) 40 MG tablet  Take 1 tablet (40 mg total) by mouth daily. 30 tablet 3  . promethazine (PHENERGAN) 25 MG tablet Take 1 tablet (25 mg total) by mouth every 6 (six) hours as needed for nausea or vomiting. 30 tablet 1  .  senna (SENOKOT) 8.6 MG TABS tablet Take 1 tablet by mouth.    . Wound Dressings (SONAFINE) Apply 1 application topically 3 (three) times daily.     No current facility-administered medications for this visit.    PHYSICAL EXAMINATION: ECOG PERFORMANCE STATUS: 1 - Symptomatic but completely ambulatory  There were no vitals filed for this visit. There were no vitals filed for this visit.  GENERAL:alert, no distress and comfortable SKIN: skin color, texture, turgor are normal, no rashes or significant lesions EYES: normal, Conjunctiva are pink and non-injected, sclera clear OROPHARYNX:no exudate, no erythema and lips, buccal mucosa, and tongue normal  NECK: supple, thyroid normal size, non-tender, without nodularity LYMPH:  no palpable lymphadenopathy in the cervical, axillary or inguinal LUNGS: clear to auscultation and percussion with normal breathing effort HEART: regular rate & rhythm and no murmurs and no lower extremity edema ABDOMEN:abdomen soft, non-tender and normal bowel sounds MUSCULOSKELETAL:no cyanosis of digits and no clubbing  NEURO: alert & oriented x 3 with fluent speech, no focal motor/sensory deficits EXTREMITIES: No lower extremity edema  LABORATORY DATA:  I have reviewed the data as listed   Chemistry      Component Value Date/Time   NA 141 01/20/2015 0820   NA 140 07/18/2014 0151   K 4.2 01/20/2015 0820   K 3.6 07/18/2014 0151   CL 102 07/18/2014 0151   CO2 25 01/20/2015 0820   CO2 27 07/10/2014 1120   BUN 9.4 01/20/2015 0820   BUN 19 07/18/2014 0151   CREATININE 0.8 01/20/2015 0820   CREATININE 0.70 07/18/2014 0151      Component Value Date/Time   CALCIUM 9.8 01/20/2015 0820   CALCIUM 10.1 07/10/2014 1120   ALKPHOS 67 01/20/2015 0820   ALKPHOS 70 07/10/2014 1120   AST 18 01/20/2015 0820   AST 25 07/10/2014 1120   ALT <9 01/20/2015 0820   ALT 19 07/10/2014 1120   BILITOT 0.42 01/20/2015 0820   BILITOT 0.5 07/10/2014 1120       Lab Results    Component Value Date   WBC 5.9 01/20/2015   HGB 13.7 01/20/2015   HCT 41.8 01/20/2015   MCV 102.3* 01/20/2015   PLT 285 01/20/2015   NEUTROABS 3.7 01/20/2015     ASSESSMENT & PLAN:  Breast cancer of upper-inner quadrant of right female 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, 2.4 x 2.2 x 2.2 cm mass which abuts the pectoral muscle, grade 2, invasive ductal carcinoma, ER 90%, PR 1%, Ki-67 20%, HER-2 positive ratio 2.97, T2 N0 M0 stage II A clinical stage Status post TCH Perjeta 6 cycles started 07/14/2014 completed 10/28/2014 Right lumpectomy 12/02/2014: Residual IDC grade 3; 2.4 cm with DCIS high-grade, posterior margin probably positive for invasive cancer, 1/4 lymph nodes positive with extracapsular extension, T2 N1 stage IIB Adj XRT completed 01/26/15  Current treatment: 1. Adjuvant antiestrogen therapy with anastrozole started January 2017 2. 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 On proton pump inhibitor therapy  Anastrozole toxicities: 1. Hot flashes: Same as before Denies any myalgias.  Return to clinic in 6 weeks for follow-up and every  3 weeks for Herceptin  No orders of the defined types were placed in this encounter.   The patient has a good understanding of the overall plan. she agrees with it. she will call with any problems that may develop before the next visit here.   Rulon Eisenmenger, MD 03/03/2015

## 2015-03-03 NOTE — Addendum Note (Signed)
Addended by: Prentiss Bells on: 03/03/2015 09:35 AM   Modules accepted: Medications

## 2015-03-05 ENCOUNTER — Telehealth: Payer: Self-pay | Admitting: *Deleted

## 2015-03-05 NOTE — Telephone Encounter (Signed)
Per POF I have scheduled appts for patient. I have called and spoken to the patient, appts given. Calendar mailed. Patient request lab results to be mailed also.  Desk RN notified. JMW

## 2015-03-24 ENCOUNTER — Other Ambulatory Visit (HOSPITAL_BASED_OUTPATIENT_CLINIC_OR_DEPARTMENT_OTHER): Payer: Medicare Other

## 2015-03-24 ENCOUNTER — Ambulatory Visit: Payer: Medicare Other | Admitting: Hematology and Oncology

## 2015-03-24 ENCOUNTER — Ambulatory Visit (HOSPITAL_BASED_OUTPATIENT_CLINIC_OR_DEPARTMENT_OTHER): Payer: Medicare Other

## 2015-03-24 VITALS — BP 146/71 | HR 90 | Temp 98.0°F | Resp 18

## 2015-03-24 DIAGNOSIS — C50211 Malignant neoplasm of upper-inner quadrant of right female breast: Secondary | ICD-10-CM | POA: Diagnosis present

## 2015-03-24 DIAGNOSIS — Z5112 Encounter for antineoplastic immunotherapy: Secondary | ICD-10-CM | POA: Diagnosis present

## 2015-03-24 LAB — CBC WITH DIFFERENTIAL/PLATELET
BASO%: 0.5 % (ref 0.0–2.0)
BASOS ABS: 0 10*3/uL (ref 0.0–0.1)
EOS%: 1.9 % (ref 0.0–7.0)
Eosinophils Absolute: 0.1 10*3/uL (ref 0.0–0.5)
HEMATOCRIT: 42.2 % (ref 34.8–46.6)
HGB: 14.1 g/dL (ref 11.6–15.9)
LYMPH#: 1.3 10*3/uL (ref 0.9–3.3)
LYMPH%: 22.7 % (ref 14.0–49.7)
MCH: 32.6 pg (ref 25.1–34.0)
MCHC: 33.4 g/dL (ref 31.5–36.0)
MCV: 97.5 fL (ref 79.5–101.0)
MONO#: 0.5 10*3/uL (ref 0.1–0.9)
MONO%: 8.8 % (ref 0.0–14.0)
NEUT#: 3.8 10*3/uL (ref 1.5–6.5)
NEUT%: 66.1 % (ref 38.4–76.8)
PLATELETS: 213 10*3/uL (ref 145–400)
RBC: 4.33 10*6/uL (ref 3.70–5.45)
RDW: 17.4 % — ABNORMAL HIGH (ref 11.2–14.5)
WBC: 5.7 10*3/uL (ref 3.9–10.3)

## 2015-03-24 LAB — COMPREHENSIVE METABOLIC PANEL
ALT: 22 U/L (ref 0–55)
ANION GAP: 13 meq/L — AB (ref 3–11)
AST: 35 U/L — ABNORMAL HIGH (ref 5–34)
Albumin: 4.1 g/dL (ref 3.5–5.0)
Alkaline Phosphatase: 64 U/L (ref 40–150)
BUN: 10.1 mg/dL (ref 7.0–26.0)
CALCIUM: 9.8 mg/dL (ref 8.4–10.4)
CHLORIDE: 104 meq/L (ref 98–109)
CO2: 24 mEq/L (ref 22–29)
CREATININE: 0.8 mg/dL (ref 0.6–1.1)
EGFR: 68 mL/min/{1.73_m2} — AB (ref >90)
Glucose: 94 mg/dl (ref 70–140)
POTASSIUM: 4.1 meq/L (ref 3.5–5.1)
Sodium: 141 mEq/L (ref 136–145)
Total Bilirubin: 0.4 mg/dL (ref 0.20–1.20)
Total Protein: 7.6 g/dL (ref 6.4–8.3)

## 2015-03-24 MED ORDER — TRASTUZUMAB CHEMO INJECTION 440 MG
6.0000 mg/kg | Freq: Once | INTRAVENOUS | Status: AC
  Administered 2015-03-24: 294 mg via INTRAVENOUS
  Filled 2015-03-24: qty 14

## 2015-03-24 MED ORDER — HEPARIN SOD (PORK) LOCK FLUSH 100 UNIT/ML IV SOLN
500.0000 [IU] | Freq: Once | INTRAVENOUS | Status: AC | PRN
  Administered 2015-03-24: 500 [IU]
  Filled 2015-03-24: qty 5

## 2015-03-24 MED ORDER — DIPHENHYDRAMINE HCL 25 MG PO CAPS
50.0000 mg | ORAL_CAPSULE | Freq: Once | ORAL | Status: AC
  Administered 2015-03-24: 25 mg via ORAL

## 2015-03-24 MED ORDER — ACETAMINOPHEN 325 MG PO TABS
650.0000 mg | ORAL_TABLET | Freq: Once | ORAL | Status: AC
  Administered 2015-03-24: 650 mg via ORAL

## 2015-03-24 MED ORDER — PALONOSETRON HCL INJECTION 0.25 MG/5ML
INTRAVENOUS | Status: AC
  Filled 2015-03-24: qty 5

## 2015-03-24 MED ORDER — SODIUM CHLORIDE 0.9 % IV SOLN
Freq: Once | INTRAVENOUS | Status: AC
  Administered 2015-03-24: 10:00:00 via INTRAVENOUS

## 2015-03-24 MED ORDER — ACETAMINOPHEN 325 MG PO TABS
ORAL_TABLET | ORAL | Status: AC
  Filled 2015-03-24: qty 2

## 2015-03-24 MED ORDER — PALONOSETRON HCL INJECTION 0.25 MG/5ML
0.2500 mg | Freq: Once | INTRAVENOUS | Status: AC
  Administered 2015-03-24: 0.25 mg via INTRAVENOUS

## 2015-03-24 MED ORDER — SODIUM CHLORIDE 0.9 % IJ SOLN
10.0000 mL | INTRAMUSCULAR | Status: DC | PRN
  Administered 2015-03-24: 10 mL
  Filled 2015-03-24: qty 10

## 2015-03-24 NOTE — Patient Instructions (Signed)
[facility] Discharge Instructions for Patients Receiving Chemotherapy  Today you received the following chemotherapy agents herceptin   If you develop nausea and vomiting that is not controlled by your nausea medication, call the clinic.   BELOW ARE SYMPTOMS THAT SHOULD BE REPORTED IMMEDIATELY:  *FEVER GREATER THAN 100.5 F  *CHILLS WITH OR WITHOUT FEVER  NAUSEA AND VOMITING THAT IS NOT CONTROLLED WITH YOUR NAUSEA MEDICATION  *UNUSUAL SHORTNESS OF BREATH  *UNUSUAL BRUISING OR BLEEDING  TENDERNESS IN MOUTH AND THROAT WITH OR WITHOUT PRESENCE OF ULCERS  *URINARY PROBLEMS  *BOWEL PROBLEMS  UNUSUAL RASH Items with * indicate a potential emergency and should be followed up as soon as possible.  Feel free to call the clinic you have any questions or concerns. The clinic phone number is [phone].  Please show the CHEMO ALERT CARD at check-in to the Emergency Department and triage nurse.   

## 2015-04-13 NOTE — Assessment & Plan Note (Signed)
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 Adjuvant antiestrogen therapy with anastrozole started January 2017 2. 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 On proton pump inhibitor therapy  Anastrozole toxicities: 1. Hot flashes: Same as before Denies any myalgias.  Return to clinic in 6 weeks for follow-up and every 3 weeks for Herceptin

## 2015-04-14 ENCOUNTER — Ambulatory Visit (HOSPITAL_BASED_OUTPATIENT_CLINIC_OR_DEPARTMENT_OTHER): Payer: Medicare Other | Admitting: Hematology and Oncology

## 2015-04-14 ENCOUNTER — Encounter: Payer: Self-pay | Admitting: Hematology and Oncology

## 2015-04-14 ENCOUNTER — Ambulatory Visit (HOSPITAL_BASED_OUTPATIENT_CLINIC_OR_DEPARTMENT_OTHER): Payer: Medicare Other

## 2015-04-14 ENCOUNTER — Other Ambulatory Visit (HOSPITAL_BASED_OUTPATIENT_CLINIC_OR_DEPARTMENT_OTHER): Payer: Medicare Other

## 2015-04-14 ENCOUNTER — Other Ambulatory Visit: Payer: Self-pay | Admitting: *Deleted

## 2015-04-14 VITALS — BP 150/80 | HR 88 | Temp 97.8°F | Resp 18 | Wt 111.2 lb

## 2015-04-14 DIAGNOSIS — C50211 Malignant neoplasm of upper-inner quadrant of right female breast: Secondary | ICD-10-CM

## 2015-04-14 DIAGNOSIS — K2970 Gastritis, unspecified, without bleeding: Secondary | ICD-10-CM | POA: Diagnosis not present

## 2015-04-14 DIAGNOSIS — Z5112 Encounter for antineoplastic immunotherapy: Secondary | ICD-10-CM | POA: Diagnosis present

## 2015-04-14 DIAGNOSIS — N951 Menopausal and female climacteric states: Secondary | ICD-10-CM | POA: Diagnosis not present

## 2015-04-14 LAB — COMPREHENSIVE METABOLIC PANEL
ALBUMIN: 4.1 g/dL (ref 3.5–5.0)
ALK PHOS: 70 U/L (ref 40–150)
ALT: 16 U/L (ref 0–55)
AST: 27 U/L (ref 5–34)
Anion Gap: 11 mEq/L (ref 3–11)
BILIRUBIN TOTAL: 0.53 mg/dL (ref 0.20–1.20)
BUN: 8.6 mg/dL (ref 7.0–26.0)
CALCIUM: 10 mg/dL (ref 8.4–10.4)
CO2: 26 mEq/L (ref 22–29)
Chloride: 105 mEq/L (ref 98–109)
Creatinine: 0.8 mg/dL (ref 0.6–1.1)
EGFR: 70 mL/min/{1.73_m2} — AB (ref >90)
GLUCOSE: 89 mg/dL (ref 70–140)
Potassium: 5.2 mEq/L — ABNORMAL HIGH (ref 3.5–5.1)
SODIUM: 143 meq/L (ref 136–145)
TOTAL PROTEIN: 7.8 g/dL (ref 6.4–8.3)

## 2015-04-14 LAB — CBC WITH DIFFERENTIAL/PLATELET
BASO%: 0.8 % (ref 0.0–2.0)
BASOS ABS: 0.1 10*3/uL (ref 0.0–0.1)
EOS ABS: 0.3 10*3/uL (ref 0.0–0.5)
EOS%: 3.3 % (ref 0.0–7.0)
HEMATOCRIT: 45.5 % (ref 34.8–46.6)
HEMOGLOBIN: 15.2 g/dL (ref 11.6–15.9)
LYMPH#: 1.3 10*3/uL (ref 0.9–3.3)
LYMPH%: 15.8 % (ref 14.0–49.7)
MCH: 32.6 pg (ref 25.1–34.0)
MCHC: 33.3 g/dL (ref 31.5–36.0)
MCV: 97.8 fL (ref 79.5–101.0)
MONO#: 0.8 10*3/uL (ref 0.1–0.9)
MONO%: 9.9 % (ref 0.0–14.0)
NEUT%: 70.2 % (ref 38.4–76.8)
NEUTROS ABS: 5.6 10*3/uL (ref 1.5–6.5)
Platelets: 281 10*3/uL (ref 145–400)
RBC: 4.65 10*6/uL (ref 3.70–5.45)
RDW: 18.8 % — AB (ref 11.2–14.5)
WBC: 7.9 10*3/uL (ref 3.9–10.3)

## 2015-04-14 MED ORDER — SODIUM CHLORIDE 0.9 % IV SOLN
Freq: Once | INTRAVENOUS | Status: AC
  Administered 2015-04-14: 11:00:00 via INTRAVENOUS

## 2015-04-14 MED ORDER — PALONOSETRON HCL INJECTION 0.25 MG/5ML
0.2500 mg | Freq: Once | INTRAVENOUS | Status: AC
  Administered 2015-04-14: 0.25 mg via INTRAVENOUS

## 2015-04-14 MED ORDER — ACETAMINOPHEN 325 MG PO TABS
650.0000 mg | ORAL_TABLET | Freq: Once | ORAL | Status: AC
  Administered 2015-04-14: 650 mg via ORAL

## 2015-04-14 MED ORDER — TRASTUZUMAB CHEMO INJECTION 440 MG
6.0000 mg/kg | Freq: Once | INTRAVENOUS | Status: AC
  Administered 2015-04-14: 294 mg via INTRAVENOUS
  Filled 2015-04-14: qty 14

## 2015-04-14 MED ORDER — SODIUM CHLORIDE 0.9 % IJ SOLN
10.0000 mL | INTRAMUSCULAR | Status: DC | PRN
  Administered 2015-04-14: 10 mL
  Filled 2015-04-14: qty 10

## 2015-04-14 MED ORDER — PALONOSETRON HCL INJECTION 0.25 MG/5ML
INTRAVENOUS | Status: AC
  Filled 2015-04-14: qty 5

## 2015-04-14 MED ORDER — ACETAMINOPHEN 325 MG PO TABS
ORAL_TABLET | ORAL | Status: AC
  Filled 2015-04-14: qty 2

## 2015-04-14 MED ORDER — HEPARIN SOD (PORK) LOCK FLUSH 100 UNIT/ML IV SOLN
500.0000 [IU] | Freq: Once | INTRAVENOUS | Status: AC | PRN
  Administered 2015-04-14: 500 [IU]
  Filled 2015-04-14: qty 5

## 2015-04-14 MED ORDER — DIPHENHYDRAMINE HCL 25 MG PO CAPS
25.0000 mg | ORAL_CAPSULE | Freq: Once | ORAL | Status: AC
  Administered 2015-04-14: 25 mg via ORAL

## 2015-04-14 MED ORDER — DIPHENHYDRAMINE HCL 25 MG PO CAPS
ORAL_CAPSULE | ORAL | Status: AC
  Filled 2015-04-14: qty 2

## 2015-04-14 NOTE — Progress Notes (Signed)
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%,HER-2 positive ratio 2.97   06/30/2014 Breast MRI Right breast 2.4 x 2.2 x 2.2 cm irregular mass, no abnormal lymph nodes   07/14/2014 - 10/28/2014 Neo-Adjuvant Chemotherapy TCH Perjeta every 3 weeks 6 followed by Herceptin maintenance   11/06/2014 Breast MRI Right breast mass has significantly decreased in size and is less masslike, maximal enhancement is 1.5 cm previously 2.4 cm   12/02/2014 Surgery Right lumpectomy: Residual IDC grade 3; 2.4 cm with DCIS high-grade, posterior margin probably positive for invasive cancer, 1/4 lymph nodes positive with extracapsular extension, T2 N1 stage IIB   12/29/2014 - 01/26/2015 Radiation Therapy Adjuvant radiation therapy   02/24/2015 -  Anti-estrogen oral therapy Anastrozole 1 mg daily    CHIEF COMPLIANT: follow-up on anastrozole and Herceptin  INTERVAL HISTORY: Marilyn Dalton is a 75 year old of an emergency right breast cancer treated with neoadjuvant chemotherapy followed by lumpectomy and radiation is currently on anastrozole therapy. She has hot flashes related to anastrozole but otherwise tolerating it fairly well. She continues to be on maintenance Herceptin and does not have any cardiac issues. She continues to have discomfort of the tips of the fingers as well as somewhat decreased taste sensation. She is also experiencing skin irritation when she gets sun exposure.  REVIEW OF SYSTEMS:   Constitutional: Denies fevers, chills or abnormal weight loss Eyes: Denies blurriness of vision Ears, nose, mouth, throat, and face: Denies mucositis or sore throat Respiratory: Denies cough, dyspnea or wheezes Cardiovascular: Denies palpitation, chest discomfort Gastrointestinal:   Denies nausea, heartburn or change in bowel habits Skin: redness and irritation with some exposure Lymphatics: Denies new lymphadenopathy or easy bruising Neurological:Denies numbness, tingling or new weaknesses Behavioral/Psych: Mood is stable, no new changes  Extremities: No lower extremity edema Breast:  denies any pain or lumps or nodules in either breasts All other systems were reviewed with the patient and are negative.  I have reviewed the past medical history, past surgical history, social history and family history with the patient and they are unchanged from previous note.  ALLERGIES:  has No Known Allergies.  MEDICATIONS:  Current Outpatient Prescriptions  Medication Sig Dispense Refill  . [START ON 12/23/2015] anastrozole (ARIMIDEX) 1 MG tablet Take 1 tablet (1 mg total) by mouth daily. 90 tablet 3  . b complex vitamins tablet Take 1 tablet by mouth daily.    . Cholecalciferol (D3 ADULT PO) Take by mouth daily.    . diphenhydrAMINE (BENADRYL) 25 MG tablet Take 25 mg by mouth 2 (two) times daily.    . Fish Oil OIL 360 mg by Does not apply route daily.    [name] lidocaine-prilocaine (EMLA) cream Apply to affected area once 30 g 3  . Magnesium 250 MG TABS Take by mouth.    [name] PNEUMOVAX 23 25 MCG/0.5ML injection ADM 0.5ML IM UTD  0  . Potassium 99 MG TABS Take 595 mg by mouth daily.    [name] senna (SENOKOT) 8.6 MG TABS tablet Take 1 tablet by mouth.    [name] UNABLE TO FIND Med Name: tumeric 557m    . vitamin E 400 UNIT capsule Take 400 Units by mouth daily.     No current facility-administered medications for this visit.  PHYSICAL EXAMINATION: ECOG PERFORMANCE STATUS: 1 - Symptomatic but completely ambulatory  Filed Vitals:   04/14/15 0925  BP: 150/80  Pulse: 88  Temp: 97.8 F (36.6 C)  Resp: 18   Filed Weights   04/14/15 0925  Weight: 111 lb 3.2 oz (50.44 kg)    GENERAL:alert, no distress and comfortable SKIN: skin color, texture, turgor are normal, no rashes or  significant lesions EYES: normal, Conjunctiva are pink and non-injected, sclera clear OROPHARYNX:no exudate, no erythema and lips, buccal mucosa, and tongue normal  NECK: supple, thyroid normal size, non-tender, without nodularity LYMPH:  no palpable lymphadenopathy in the cervical, axillary or inguinal LUNGS: clear to auscultation and percussion with normal breathing effort HEART: regular rate & rhythm and no murmurs and no lower extremity edema ABDOMEN:abdomen soft, non-tender and normal bowel sounds MUSCULOSKELETAL:no cyanosis of digits and no clubbing  NEURO: alert & oriented x 3 with fluent speech, no focal motor/sensory deficits EXTREMITIES: No lower extremity edema LABORATORY DATA:  I have reviewed the data as listed   Chemistry      Component Value Date/Time   NA 143 04/14/2015 0914   NA 140 07/18/2014 0151   K 5.2 No hemolysis* 04/14/2015 0914   K 3.6 07/18/2014 0151   CL 102 07/18/2014 0151   CO2 26 04/14/2015 0914   CO2 27 07/10/2014 1120   BUN 8.6 04/14/2015 0914   BUN 19 07/18/2014 0151   CREATININE 0.8 04/14/2015 0914   CREATININE 0.70 07/18/2014 0151      Component Value Date/Time   CALCIUM 10.0 04/14/2015 0914   CALCIUM 10.1 07/10/2014 1120   ALKPHOS 70 04/14/2015 0914   ALKPHOS 70 07/10/2014 1120   AST 27 04/14/2015 0914   AST 25 07/10/2014 1120   ALT 16 04/14/2015 0914   ALT 19 07/10/2014 1120   BILITOT 0.53 04/14/2015 0914   BILITOT 0.5 07/10/2014 1120       Lab Results  Component Value Date   WBC 7.9 04/14/2015   HGB 15.2 04/14/2015   HCT 45.5 04/14/2015   MCV 97.8 04/14/2015   PLT 281 04/14/2015   NEUTROABS 5.6 04/14/2015   ASSESSMENT & PLAN:  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 Adjuvant antiestrogen therapy with anastrozole started January 2017 2. 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 On proton pump inhibitor therapy  Anastrozole toxicities: 1. Hot flashes: Same as before 2. Rash when she gets exposed to hot weather or high temperatures.  Denies any myalgias.  Return to clinic in 6 weeks for follow-up and every 3 weeks for Herceptin   No orders of the defined types were placed in this encounter.   The patient has a good understanding of the overall plan. she agrees with it. she will call with any problems that may develop before the next visit here.   Lindi Adie,  Tyler Deis, MD 04/14/2015

## 2015-04-14 NOTE — Patient Instructions (Signed)
[facility] Discharge Instructions for Patients Receiving Chemotherapy  Today you received the following chemotherapy agents herceptin   If you develop nausea and vomiting that is not controlled by your nausea medication, call the clinic.   BELOW ARE SYMPTOMS THAT SHOULD BE REPORTED IMMEDIATELY:  *FEVER GREATER THAN 100.5 F  *CHILLS WITH OR WITHOUT FEVER  NAUSEA AND VOMITING THAT IS NOT CONTROLLED WITH YOUR NAUSEA MEDICATION  *UNUSUAL SHORTNESS OF BREATH  *UNUSUAL BRUISING OR BLEEDING  TENDERNESS IN MOUTH AND THROAT WITH OR WITHOUT PRESENCE OF ULCERS  *URINARY PROBLEMS  *BOWEL PROBLEMS  UNUSUAL RASH Items with * indicate a potential emergency and should be followed up as soon as possible.  Feel free to call the clinic you have any questions or concerns. The clinic phone number is [phone].  Please show the CHEMO ALERT CARD at check-in to the Emergency Department and triage nurse.   

## 2015-04-17 ENCOUNTER — Telehealth: Payer: Self-pay | Admitting: Hematology and Oncology

## 2015-04-17 NOTE — Telephone Encounter (Signed)
Left message to inform patient of next scheduled appt 3/14.

## 2015-05-05 ENCOUNTER — Other Ambulatory Visit: Payer: Self-pay | Admitting: *Deleted

## 2015-05-05 ENCOUNTER — Other Ambulatory Visit (HOSPITAL_BASED_OUTPATIENT_CLINIC_OR_DEPARTMENT_OTHER): Payer: Medicare Other

## 2015-05-05 ENCOUNTER — Telehealth: Payer: Self-pay | Admitting: Hematology and Oncology

## 2015-05-05 ENCOUNTER — Ambulatory Visit (HOSPITAL_BASED_OUTPATIENT_CLINIC_OR_DEPARTMENT_OTHER): Payer: Medicare Other

## 2015-05-05 VITALS — BP 133/67 | HR 89 | Temp 98.1°F | Resp 16

## 2015-05-05 DIAGNOSIS — C50211 Malignant neoplasm of upper-inner quadrant of right female breast: Secondary | ICD-10-CM

## 2015-05-05 DIAGNOSIS — Z5112 Encounter for antineoplastic immunotherapy: Secondary | ICD-10-CM | POA: Diagnosis present

## 2015-05-05 LAB — COMPREHENSIVE METABOLIC PANEL
ALT: 19 U/L (ref 0–55)
ANION GAP: 9 meq/L (ref 3–11)
AST: 28 U/L (ref 5–34)
Albumin: 4.1 g/dL (ref 3.5–5.0)
Alkaline Phosphatase: 66 U/L (ref 40–150)
BILIRUBIN TOTAL: 0.46 mg/dL (ref 0.20–1.20)
BUN: 12.6 mg/dL (ref 7.0–26.0)
CALCIUM: 10.1 mg/dL (ref 8.4–10.4)
CO2: 29 mEq/L (ref 22–29)
CREATININE: 0.9 mg/dL (ref 0.6–1.1)
Chloride: 104 mEq/L (ref 98–109)
EGFR: 66 mL/min/{1.73_m2} — ABNORMAL LOW (ref >90)
Glucose: 101 mg/dl (ref 70–140)
Potassium: 4.8 mEq/L (ref 3.5–5.1)
Sodium: 142 mEq/L (ref 136–145)
TOTAL PROTEIN: 7.8 g/dL (ref 6.4–8.3)

## 2015-05-05 LAB — CBC WITH DIFFERENTIAL/PLATELET
BASO%: 0.9 % (ref 0.0–2.0)
Basophils Absolute: 0.1 10*3/uL (ref 0.0–0.1)
EOS%: 3.1 % (ref 0.0–7.0)
Eosinophils Absolute: 0.2 10*3/uL (ref 0.0–0.5)
HEMATOCRIT: 45.2 % (ref 34.8–46.6)
HGB: 15 g/dL (ref 11.6–15.9)
LYMPH#: 1.5 10*3/uL (ref 0.9–3.3)
LYMPH%: 21.5 % (ref 14.0–49.7)
MCH: 33.1 pg (ref 25.1–34.0)
MCHC: 33.2 g/dL (ref 31.5–36.0)
MCV: 99.5 fL (ref 79.5–101.0)
MONO#: 0.8 10*3/uL (ref 0.1–0.9)
MONO%: 11.5 % (ref 0.0–14.0)
NEUT%: 63 % (ref 38.4–76.8)
NEUTROS ABS: 4.4 10*3/uL (ref 1.5–6.5)
PLATELETS: 263 10*3/uL (ref 145–400)
RBC: 4.54 10*6/uL (ref 3.70–5.45)
RDW: 18.4 % — ABNORMAL HIGH (ref 11.2–14.5)
WBC: 7 10*3/uL (ref 3.9–10.3)

## 2015-05-05 MED ORDER — HEPARIN SOD (PORK) LOCK FLUSH 100 UNIT/ML IV SOLN
500.0000 [IU] | Freq: Once | INTRAVENOUS | Status: AC | PRN
  Administered 2015-05-05: 500 [IU]
  Filled 2015-05-05: qty 5

## 2015-05-05 MED ORDER — DIPHENHYDRAMINE HCL 25 MG PO CAPS
ORAL_CAPSULE | ORAL | Status: AC
  Filled 2015-05-05: qty 1

## 2015-05-05 MED ORDER — ACETAMINOPHEN 325 MG PO TABS
650.0000 mg | ORAL_TABLET | Freq: Once | ORAL | Status: AC
  Administered 2015-05-05: 650 mg via ORAL

## 2015-05-05 MED ORDER — SODIUM CHLORIDE 0.9 % IJ SOLN
10.0000 mL | INTRAMUSCULAR | Status: DC | PRN
  Administered 2015-05-05: 10 mL
  Filled 2015-05-05: qty 10

## 2015-05-05 MED ORDER — PALONOSETRON HCL INJECTION 0.25 MG/5ML
0.2500 mg | Freq: Once | INTRAVENOUS | Status: AC
  Administered 2015-05-05: 0.25 mg via INTRAVENOUS

## 2015-05-05 MED ORDER — DIPHENHYDRAMINE HCL 25 MG PO CAPS
25.0000 mg | ORAL_CAPSULE | Freq: Once | ORAL | Status: AC
  Administered 2015-05-05: 25 mg via ORAL

## 2015-05-05 MED ORDER — ACETAMINOPHEN 325 MG PO TABS
ORAL_TABLET | ORAL | Status: AC
  Filled 2015-05-05: qty 2

## 2015-05-05 MED ORDER — SODIUM CHLORIDE 0.9 % IV SOLN
Freq: Once | INTRAVENOUS | Status: AC
  Administered 2015-05-05: 09:00:00 via INTRAVENOUS

## 2015-05-05 MED ORDER — PALONOSETRON HCL INJECTION 0.25 MG/5ML
INTRAVENOUS | Status: AC
  Filled 2015-05-05: qty 5

## 2015-05-05 MED ORDER — SODIUM CHLORIDE 0.9 % IV SOLN
6.0000 mg/kg | Freq: Once | INTRAVENOUS | Status: AC
  Administered 2015-05-05: 294 mg via INTRAVENOUS
  Filled 2015-05-05: qty 14

## 2015-05-05 NOTE — Progress Notes (Signed)
Note sent to Kevan Rosebush to schedule patient for an echo at the end of March 2017.

## 2015-05-05 NOTE — Telephone Encounter (Signed)
Added treatment appt to 4/4 appt

## 2015-05-05 NOTE — Patient Instructions (Signed)
[facility] Discharge Instructions for Patients Receiving Chemotherapy  Today you received the following chemotherapy agents herceptin   If you develop nausea and vomiting that is not controlled by your nausea medication, call the clinic.   BELOW ARE SYMPTOMS THAT SHOULD BE REPORTED IMMEDIATELY:  *FEVER GREATER THAN 100.5 F  *CHILLS WITH OR WITHOUT FEVER  NAUSEA AND VOMITING THAT IS NOT CONTROLLED WITH YOUR NAUSEA MEDICATION  *UNUSUAL SHORTNESS OF BREATH  *UNUSUAL BRUISING OR BLEEDING  TENDERNESS IN MOUTH AND THROAT WITH OR WITHOUT PRESENCE OF ULCERS  *URINARY PROBLEMS  *BOWEL PROBLEMS  UNUSUAL RASH Items with * indicate a potential emergency and should be followed up as soon as possible.  Feel free to call the clinic you have any questions or concerns. The clinic phone number is [phone].  Please show the CHEMO ALERT CARD at check-in to the Emergency Department and triage nurse.   

## 2015-05-07 ENCOUNTER — Other Ambulatory Visit: Payer: Self-pay | Admitting: *Deleted

## 2015-05-07 ENCOUNTER — Telehealth: Payer: Self-pay | Admitting: Hematology and Oncology

## 2015-05-07 DIAGNOSIS — C50211 Malignant neoplasm of upper-inner quadrant of right female breast: Secondary | ICD-10-CM

## 2015-05-07 DIAGNOSIS — Z9189 Other specified personal risk factors, not elsewhere classified: Secondary | ICD-10-CM

## 2015-05-07 NOTE — Telephone Encounter (Signed)
sch echo per 3/16 pof. Left vm for patient to inform her of date/time

## 2015-05-20 ENCOUNTER — Telehealth: Payer: Self-pay

## 2015-05-20 ENCOUNTER — Ambulatory Visit (HOSPITAL_COMMUNITY)
Admission: RE | Admit: 2015-05-20 | Discharge: 2015-05-20 | Disposition: A | Discharge status: Home / Self Care | Payer: Medicare Other | Source: Ambulatory Visit | Attending: Hematology and Oncology | Admitting: Hematology and Oncology

## 2015-05-20 DIAGNOSIS — Z720 Tobacco use: Secondary | ICD-10-CM | POA: Diagnosis not present

## 2015-05-20 DIAGNOSIS — T451X5D Adverse effect of antineoplastic and immunosuppressive drugs, subsequent encounter: Secondary | ICD-10-CM | POA: Diagnosis not present

## 2015-05-20 DIAGNOSIS — C50211 Malignant neoplasm of upper-inner quadrant of right female breast: Secondary | ICD-10-CM | POA: Diagnosis not present

## 2015-05-20 DIAGNOSIS — Z9189 Other specified personal risk factors, not elsewhere classified: Secondary | ICD-10-CM | POA: Insufficient documentation

## 2015-05-20 DIAGNOSIS — I340 Nonrheumatic mitral (valve) insufficiency: Secondary | ICD-10-CM | POA: Insufficient documentation

## 2015-05-20 DIAGNOSIS — Z09 Encounter for follow-up examination after completed treatment for conditions other than malignant neoplasm: Secondary | ICD-10-CM | POA: Diagnosis present

## 2015-05-20 NOTE — Telephone Encounter (Signed)
Let pt know echo result 60-65% = better than previous.  Pt voiced understanding.

## 2015-05-20 NOTE — Progress Notes (Signed)
*  PRELIMINARY RESULTS* Echocardiogram 2D Echocardiogram has been performed.  Leavy Cella 05/20/2015, 10:10 AM

## 2015-05-26 ENCOUNTER — Other Ambulatory Visit (HOSPITAL_BASED_OUTPATIENT_CLINIC_OR_DEPARTMENT_OTHER): Payer: Medicare Other

## 2015-05-26 ENCOUNTER — Encounter: Payer: Self-pay | Admitting: Hematology and Oncology

## 2015-05-26 ENCOUNTER — Ambulatory Visit (HOSPITAL_BASED_OUTPATIENT_CLINIC_OR_DEPARTMENT_OTHER): Payer: Medicare Other

## 2015-05-26 ENCOUNTER — Ambulatory Visit (HOSPITAL_BASED_OUTPATIENT_CLINIC_OR_DEPARTMENT_OTHER): Payer: Medicare Other | Admitting: Hematology and Oncology

## 2015-05-26 ENCOUNTER — Telehealth: Payer: Self-pay | Admitting: Hematology and Oncology

## 2015-05-26 VITALS — BP 137/75 | HR 84 | Temp 98.1°F | Resp 18 | Ht 65.0 in | Wt 112.9 lb

## 2015-05-26 DIAGNOSIS — F329 Major depressive disorder, single episode, unspecified: Secondary | ICD-10-CM

## 2015-05-26 DIAGNOSIS — F332 Major depressive disorder, recurrent severe without psychotic features: Secondary | ICD-10-CM

## 2015-05-26 DIAGNOSIS — K2970 Gastritis, unspecified, without bleeding: Secondary | ICD-10-CM | POA: Diagnosis not present

## 2015-05-26 DIAGNOSIS — Z5112 Encounter for antineoplastic immunotherapy: Secondary | ICD-10-CM

## 2015-05-26 DIAGNOSIS — C50211 Malignant neoplasm of upper-inner quadrant of right female breast: Secondary | ICD-10-CM | POA: Diagnosis present

## 2015-05-26 LAB — COMPREHENSIVE METABOLIC PANEL
ALT: 19 U/L (ref 0–55)
ANION GAP: 10 meq/L (ref 3–11)
AST: 26 U/L (ref 5–34)
Albumin: 3.8 g/dL (ref 3.5–5.0)
Alkaline Phosphatase: 60 U/L (ref 40–150)
BILIRUBIN TOTAL: 0.45 mg/dL (ref 0.20–1.20)
BUN: 13.3 mg/dL (ref 7.0–26.0)
CHLORIDE: 106 meq/L (ref 98–109)
CO2: 26 meq/L (ref 22–29)
Calcium: 9.8 mg/dL (ref 8.4–10.4)
Creatinine: 0.8 mg/dL (ref 0.6–1.1)
EGFR: 68 mL/min/{1.73_m2} — AB (ref >90)
GLUCOSE: 118 mg/dL (ref 70–140)
Potassium: 4.7 mEq/L (ref 3.5–5.1)
SODIUM: 142 meq/L (ref 136–145)
TOTAL PROTEIN: 7.5 g/dL (ref 6.4–8.3)

## 2015-05-26 LAB — CBC WITH DIFFERENTIAL/PLATELET
BASO%: 0.5 % (ref 0.0–2.0)
Basophils Absolute: 0 10*3/uL (ref 0.0–0.1)
EOS ABS: 0.2 10*3/uL (ref 0.0–0.5)
EOS%: 4.1 % (ref 0.0–7.0)
HCT: 42.6 % (ref 34.8–46.6)
HGB: 14.4 g/dL (ref 11.6–15.9)
LYMPH%: 27.9 % (ref 14.0–49.7)
MCH: 33.8 pg (ref 25.1–34.0)
MCHC: 33.8 g/dL (ref 31.5–36.0)
MCV: 100 fL (ref 79.5–101.0)
MONO#: 0.5 10*3/uL (ref 0.1–0.9)
MONO%: 8.6 % (ref 0.0–14.0)
NEUT%: 58.9 % (ref 38.4–76.8)
NEUTROS ABS: 3.4 10*3/uL (ref 1.5–6.5)
PLATELETS: 264 10*3/uL (ref 145–400)
RBC: 4.26 10*6/uL (ref 3.70–5.45)
RDW: 15.9 % — ABNORMAL HIGH (ref 11.2–14.5)
WBC: 5.8 10*3/uL (ref 3.9–10.3)
lymph#: 1.6 10*3/uL (ref 0.9–3.3)

## 2015-05-26 MED ORDER — ACETAMINOPHEN 325 MG PO TABS
ORAL_TABLET | ORAL | Status: AC
  Filled 2015-05-26: qty 2

## 2015-05-26 MED ORDER — SODIUM CHLORIDE 0.9 % IJ SOLN
10.0000 mL | INTRAMUSCULAR | Status: DC | PRN
  Administered 2015-05-26: 10 mL
  Filled 2015-05-26: qty 10

## 2015-05-26 MED ORDER — DIPHENHYDRAMINE HCL 25 MG PO CAPS
ORAL_CAPSULE | ORAL | Status: AC
  Filled 2015-05-26: qty 1

## 2015-05-26 MED ORDER — ACETAMINOPHEN 325 MG PO TABS
650.0000 mg | ORAL_TABLET | Freq: Once | ORAL | Status: AC
  Administered 2015-05-26: 650 mg via ORAL

## 2015-05-26 MED ORDER — TRASTUZUMAB CHEMO INJECTION 440 MG
6.0000 mg/kg | Freq: Once | INTRAVENOUS | Status: AC
  Administered 2015-05-26: 294 mg via INTRAVENOUS
  Filled 2015-05-26: qty 14

## 2015-05-26 MED ORDER — SODIUM CHLORIDE 0.9 % IV SOLN
Freq: Once | INTRAVENOUS | Status: AC
  Administered 2015-05-26: 09:00:00 via INTRAVENOUS

## 2015-05-26 MED ORDER — HEPARIN SOD (PORK) LOCK FLUSH 100 UNIT/ML IV SOLN
500.0000 [IU] | Freq: Once | INTRAVENOUS | Status: AC | PRN
  Administered 2015-05-26: 500 [IU]
  Filled 2015-05-26: qty 5

## 2015-05-26 MED ORDER — DIPHENHYDRAMINE HCL 25 MG PO CAPS
25.0000 mg | ORAL_CAPSULE | Freq: Once | ORAL | Status: AC
  Administered 2015-05-26: 25 mg via ORAL

## 2015-05-26 MED ORDER — VENLAFAXINE HCL ER 37.5 MG PO CP24: 37.5000 mg | ORAL_CAPSULE | Freq: Every day | ORAL | Status: DC

## 2015-05-26 MED ORDER — PALONOSETRON HCL INJECTION 0.25 MG/5ML
0.2500 mg | Freq: Once | INTRAVENOUS | Status: AC
  Administered 2015-05-26: 0.25 mg via INTRAVENOUS

## 2015-05-26 MED ORDER — PALONOSETRON HCL INJECTION 0.25 MG/5ML
INTRAVENOUS | Status: AC
  Filled 2015-05-26: qty 5

## 2015-05-26 NOTE — Progress Notes (Addendum)
Unable to get in to exam room prior to MD.  No assessment performed.  

## 2015-05-26 NOTE — Patient Instructions (Signed)
[facility] Discharge Instructions for Patients Receiving Chemotherapy  Today you received the following: Herceptin  To help prevent nausea and vomiting after your treatment, we encourage you to take your nausea medication as direcetd.   If you develop nausea and vomiting that is not controlled by your nausea medication, call the clinic.   BELOW ARE SYMPTOMS THAT SHOULD BE REPORTED IMMEDIATELY:  *FEVER GREATER THAN 100.5 F  *CHILLS WITH OR WITHOUT FEVER  NAUSEA AND VOMITING THAT IS NOT CONTROLLED WITH YOUR NAUSEA MEDICATION  *UNUSUAL SHORTNESS OF BREATH  *UNUSUAL BRUISING OR BLEEDING  TENDERNESS IN MOUTH AND THROAT WITH OR WITHOUT PRESENCE OF ULCERS  *URINARY PROBLEMS  *BOWEL PROBLEMS  UNUSUAL RASH Items with * indicate a potential emergency and should be followed up as soon as possible.  Feel free to call the clinic you have any questions or concerns. The clinic phone number is [phone].  Please show the [facility] at check-in to the Emergency Department and triage nurse.

## 2015-05-26 NOTE — Progress Notes (Signed)
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%,HER-2 positive ratio 2.97   06/30/2014 Breast MRI Right breast 2.4 x 2.2 x 2.2 cm irregular mass, no abnormal lymph nodes   07/14/2014 - 10/28/2014 Neo-Adjuvant Chemotherapy TCH Perjeta every 3 weeks 6 followed by Herceptin maintenance   11/06/2014 Breast MRI Right breast mass has significantly decreased in size and is less masslike, maximal enhancement is 1.5 cm previously 2.4 cm   12/02/2014 Surgery Right lumpectomy: Residual IDC grade 3; 2.4 cm with DCIS high-grade, posterior margin probably positive for invasive cancer, 1/4 lymph nodes positive with extracapsular extension, T2 N1 stage IIB   12/29/2014 - 01/26/2015 Radiation Therapy Adjuvant radiation therapy   02/24/2015 -  Anti-estrogen oral therapy Anastrozole 1 mg daily    CHIEF COMPLIANT: Severe mood swings and depression  INTERVAL HISTORY: Marilyn Dalton is a 75 year old with above-mentioned history of right breast cancer ER/PR HER-2 positive disease who had residual disease after neoadjuvant chemotherapy. She will have positive lymph nodes after neoadjuvant treatment. She was on anastrozole started January 2017. Over the past 3 months she had experienced profound mood swings emotional disturbances tearfulness and feeling of ill health. She had lack of taste and appetite as well as decreased energy towards end of the day. But mostly her biggest complaint has been tearfulness and mood swings. She stopped anastrozole a week ago and is here today to discuss overall plan. She does not want to go back on antiestrogen therapy.  REVIEW OF SYSTEMS:   Constitutional: Denies fevers, chills or abnormal weight loss, fatigue, decreased taste and appetite  Eyes: Denies  blurriness of vision Ears, nose, mouth, throat, and face: Denies mucositis or sore throat Respiratory: Denies cough, dyspnea or wheezes Cardiovascular: Denies palpitation, chest discomfort Gastrointestinal:  Denies nausea, heartburn or change in bowel habits Skin: Denies abnormal skin rashes Lymphatics: Denies new lymphadenopathy or easy bruising Neurological:Denies numbness, tingling or new weaknesses Behavioral/Psych:  Depression and mood swings Extremities: No lower extremity edema Breast:  denies any pain or lumps or nodules in either breasts All other systems were reviewed with the patient and are negative.  I have reviewed the past medical history, past surgical history, social history and family history with the patient and they are unchanged from previous note.  ALLERGIES:  has No Known Allergies.  MEDICATIONS:  Current Outpatient Prescriptions  Medication Sig Dispense Refill  . b complex vitamins tablet Take 1 tablet by mouth daily.    . Cholecalciferol (D3 ADULT PO) Take by mouth daily.    . diphenhydrAMINE (BENADRYL) 25 MG tablet Take 25 mg by mouth 2 (two) times daily.    . Fish Oil OIL 360 mg by Does not apply route daily.    [name] lidocaine-prilocaine (EMLA) cream Apply to affected area once 30 g 3  . Magnesium 250 MG TABS Take by mouth.    [name] PNEUMOVAX 23 25 MCG/0.5ML injection ADM 0.5ML IM UTD  0  . Potassium 99 MG TABS Take 595 mg by mouth daily.    [name] senna (SENOKOT) 8.6 MG TABS tablet Take 1 tablet by mouth.    [name] UNABLE TO FIND Med Name: tumeric 558m    . venlafaxine XR (EFFEXOR-XR) 37.5 MG 24 hr capsule Take 1 capsule (37.5 mg total) by mouth  daily with breakfast. 30 capsule 6  . vitamin E 400 UNIT capsule Take 400 Units by mouth daily.     No current facility-administered medications for this visit.    PHYSICAL EXAMINATION: ECOG PERFORMANCE STATUS: 1 - Symptomatic but completely ambulatory  Filed Vitals:   05/26/15 0827  BP: 137/75  Pulse: 84  Temp: 98.1 F  (36.7 C)  Resp: 18   Filed Weights   05/26/15 0827  Weight: 112 lb 14.4 oz (51.211 kg)    GENERAL:alert, no distress and comfortable SKIN: skin color, texture, turgor are normal, no rashes or significant lesions EYES: normal, Conjunctiva are pink and non-injected, sclera clear OROPHARYNX:no exudate, no erythema and lips, buccal mucosa, and tongue normal  NECK: supple, thyroid normal size, non-tender, without nodularity LYMPH:  no palpable lymphadenopathy in the cervical, axillary or inguinal LUNGS: clear to auscultation and percussion with normal breathing effort HEART: regular rate & rhythm and no murmurs and no lower extremity edema ABDOMEN:abdomen soft, non-tender and normal bowel sounds MUSCULOSKELETAL:no cyanosis of digits and no clubbing  NEURO: alert & oriented x 3 with fluent speech, no focal motor/sensory deficits EXTREMITIES: No lower extremity edema   LABORATORY DATA:  I have reviewed the data as listed   Chemistry      Component Value Date/Time   NA 142 05/26/2015 0804   NA 140 07/18/2014 0151   K 4.7 05/26/2015 0804   K 3.6 07/18/2014 0151   CL 102 07/18/2014 0151   CO2 26 05/26/2015 0804   CO2 27 07/10/2014 1120   BUN 13.3 05/26/2015 0804   BUN 19 07/18/2014 0151   CREATININE 0.8 05/26/2015 0804   CREATININE 0.70 07/18/2014 0151      Component Value Date/Time   CALCIUM 9.8 05/26/2015 0804   CALCIUM 10.1 07/10/2014 1120   ALKPHOS 60 05/26/2015 0804   ALKPHOS 70 07/10/2014 1120   AST 26 05/26/2015 0804   AST 25 07/10/2014 1120   ALT 19 05/26/2015 0804   ALT 19 07/10/2014 1120   BILITOT 0.45 05/26/2015 0804   BILITOT 0.5 07/10/2014 1120       Lab Results  Component Value Date   WBC 5.8 05/26/2015   HGB 14.4 05/26/2015   HCT 42.6 05/26/2015   MCV 100.0 05/26/2015   PLT 264 05/26/2015   NEUTROABS 3.4 05/26/2015     ASSESSMENT & PLAN:  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 Adjuvant antiestrogen therapy with anastrozole started January 2017 2. 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 On proton pump inhibitor therapy  Anastrozole toxicities: 1. Hot flashes: Same as before 2. Profound emotional disturbances with depression and mood swings 3. Lack of taste, decreased energy and decreased appetite  Patient discontinued anastrozole 05/19/2015.   Recommendation:  1. Start Effexor XR 37.5 mg daily for depression.  2.  Major depression: Starting  Effexor X are 37.5 mg daily. Once her depression gets better, we will address restarting anti-estrogen therapy because I feel very strongly that she needs to be on anti-estrogens given the extent of her disease post neoadjuvant chemotherapy.  Denies any myalgias.  Return to clinic in 6 weeks for follow-up and every 3 weeks for Herceptin   No orders of the defined types were placed in this encounter.   The patient has a good understanding of the overall plan. she agrees with it. she will call with any problems that may develop before the next visit here.   Rulon Eisenmenger, MD 05/26/2015

## 2015-05-26 NOTE — Assessment & Plan Note (Signed)
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 Adjuvant antiestrogen therapy with anastrozole started January 2017 2. 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 On proton pump inhibitor therapy  Anastrozole toxicities: 1. Hot flashes: Same as before 2. Rash when she gets exposed to hot weather or high temperatures.  Denies any myalgias.  Return to clinic in 6 weeks for follow-up and every 3 weeks for Herceptin

## 2015-05-26 NOTE — Telephone Encounter (Signed)
appt made and avs will print in treatment room °

## 2015-05-26 NOTE — Progress Notes (Signed)
Pt refused AVS.

## 2015-06-16 ENCOUNTER — Ambulatory Visit: Payer: Medicare Other

## 2015-06-16 ENCOUNTER — Other Ambulatory Visit: Payer: Self-pay

## 2015-06-16 ENCOUNTER — Ambulatory Visit (HOSPITAL_BASED_OUTPATIENT_CLINIC_OR_DEPARTMENT_OTHER): Payer: Medicare Other

## 2015-06-16 ENCOUNTER — Other Ambulatory Visit (HOSPITAL_BASED_OUTPATIENT_CLINIC_OR_DEPARTMENT_OTHER): Payer: Medicare Other

## 2015-06-16 VITALS — BP 149/81 | HR 96 | Temp 98.5°F | Resp 18

## 2015-06-16 DIAGNOSIS — C50211 Malignant neoplasm of upper-inner quadrant of right female breast: Secondary | ICD-10-CM

## 2015-06-16 DIAGNOSIS — F332 Major depressive disorder, recurrent severe without psychotic features: Secondary | ICD-10-CM

## 2015-06-16 DIAGNOSIS — Z5112 Encounter for antineoplastic immunotherapy: Secondary | ICD-10-CM | POA: Diagnosis present

## 2015-06-16 LAB — COMPREHENSIVE METABOLIC PANEL
ALBUMIN: 4.1 g/dL (ref 3.5–5.0)
ALK PHOS: 71 U/L (ref 40–150)
ALT: 18 U/L (ref 0–55)
ANION GAP: 13 meq/L — AB (ref 3–11)
AST: 30 U/L (ref 5–34)
BILIRUBIN TOTAL: 0.56 mg/dL (ref 0.20–1.20)
BUN: 7.5 mg/dL (ref 7.0–26.0)
CO2: 25 meq/L (ref 22–29)
Calcium: 10.2 mg/dL (ref 8.4–10.4)
Chloride: 103 mEq/L (ref 98–109)
Creatinine: 0.8 mg/dL (ref 0.6–1.1)
EGFR: 73 mL/min/{1.73_m2} — AB (ref >90)
Glucose: 95 mg/dl (ref 70–140)
POTASSIUM: 4.7 meq/L (ref 3.5–5.1)
Sodium: 141 mEq/L (ref 136–145)
TOTAL PROTEIN: 7.9 g/dL (ref 6.4–8.3)

## 2015-06-16 LAB — CBC WITH DIFFERENTIAL/PLATELET
BASO%: 1 % (ref 0.0–2.0)
BASOS ABS: 0.1 10*3/uL (ref 0.0–0.1)
EOS ABS: 0.2 10*3/uL (ref 0.0–0.5)
EOS%: 2.8 % (ref 0.0–7.0)
HCT: 47.6 % — ABNORMAL HIGH (ref 34.8–46.6)
HGB: 15.7 g/dL (ref 11.6–15.9)
LYMPH%: 19.3 % (ref 14.0–49.7)
MCH: 33.3 pg (ref 25.1–34.0)
MCHC: 32.9 g/dL (ref 31.5–36.0)
MCV: 101.2 fL — AB (ref 79.5–101.0)
MONO#: 0.8 10*3/uL (ref 0.1–0.9)
MONO%: 10.8 % (ref 0.0–14.0)
NEUT%: 66.1 % (ref 38.4–76.8)
NEUTROS ABS: 4.8 10*3/uL (ref 1.5–6.5)
PLATELETS: 298 10*3/uL (ref 145–400)
RBC: 4.7 10*6/uL (ref 3.70–5.45)
RDW: 14.7 % — ABNORMAL HIGH (ref 11.2–14.5)
WBC: 7.2 10*3/uL (ref 3.9–10.3)
lymph#: 1.4 10*3/uL (ref 0.9–3.3)

## 2015-06-16 MED ORDER — PALONOSETRON HCL INJECTION 0.25 MG/5ML
0.2500 mg | Freq: Once | INTRAVENOUS | Status: AC
  Administered 2015-06-16: 0.25 mg via INTRAVENOUS

## 2015-06-16 MED ORDER — SODIUM CHLORIDE 0.9 % IV SOLN
6.0000 mg/kg | Freq: Once | INTRAVENOUS | Status: AC
  Administered 2015-06-16: 294 mg via INTRAVENOUS
  Filled 2015-06-16: qty 14

## 2015-06-16 MED ORDER — VENLAFAXINE HCL ER 75 MG PO CP24: 75.0000 mg | ORAL_CAPSULE | Freq: Every day | ORAL | Status: DC

## 2015-06-16 MED ORDER — PALONOSETRON HCL INJECTION 0.25 MG/5ML
INTRAVENOUS | Status: AC
  Filled 2015-06-16: qty 5

## 2015-06-16 MED ORDER — HEPARIN SOD (PORK) LOCK FLUSH 100 UNIT/ML IV SOLN
500.0000 [IU] | Freq: Once | INTRAVENOUS | Status: AC | PRN
  Administered 2015-06-16: 500 [IU]
  Filled 2015-06-16: qty 5

## 2015-06-16 MED ORDER — SODIUM CHLORIDE 0.9 % IV SOLN
Freq: Once | INTRAVENOUS | Status: AC
  Administered 2015-06-16: 09:00:00 via INTRAVENOUS

## 2015-06-16 MED ORDER — DIPHENHYDRAMINE HCL 25 MG PO CAPS
ORAL_CAPSULE | ORAL | Status: AC
  Filled 2015-06-16: qty 1

## 2015-06-16 MED ORDER — ACETAMINOPHEN 325 MG PO TABS
650.0000 mg | ORAL_TABLET | Freq: Once | ORAL | Status: AC
  Administered 2015-06-16: 650 mg via ORAL

## 2015-06-16 MED ORDER — SODIUM CHLORIDE 0.9 % IJ SOLN
10.0000 mL | INTRAMUSCULAR | Status: DC | PRN
  Administered 2015-06-16: 10 mL
  Filled 2015-06-16: qty 10

## 2015-06-16 MED ORDER — DIPHENHYDRAMINE HCL 25 MG PO CAPS
25.0000 mg | ORAL_CAPSULE | Freq: Once | ORAL | Status: AC
Start: 2015-06-16 — End: 2015-06-16
  Administered 2015-06-16: 25 mg via ORAL

## 2015-06-16 MED ORDER — ACETAMINOPHEN 325 MG PO TABS
ORAL_TABLET | ORAL | Status: AC
  Filled 2015-06-16: qty 2

## 2015-06-16 NOTE — Progress Notes (Signed)
I spoke with the patient in infusion at her request.  Patient was highly stressed and tearful.  She reports she has finished chemo and she is "worse".  She clarified she is emotionally.  Patient reports:  Fatigue, fuzzy head, tearful, anxious, feelings her hear is racing, "burning and pain at her port", chills after last two herceptin treatments (denies any temps - temp check with last episode was 97.6), infection in right tooth - it is an implant and oral surgeon reports bone loss - will need oral surgery which her surgeon refuses to do until she completes chemo.    Per Dr. Lindi Adie, pt should increase venlafaxine to 75 mg daily and cultures ordered.  Cultures to be drawn in infusion - orders entered, infusion RN notified and requested lab send labels to infusion.  Venlafaxine Rx refilled for 75 mg.  Pt provided with copy of labs for oral surgeon and pt advised we will be happy to provide her a clearance letter if needed.  Pt voiced understanding.

## 2015-06-16 NOTE — Patient Instructions (Signed)
[facility] Discharge Instructions for Patients Receiving Chemotherapy  Today you received the following chemotherapy agents:  Herceptin  To help prevent nausea and vomiting after your treatment, we encourage you to take your nausea medication as prescribed.   If you develop nausea and vomiting that is not controlled by your nausea medication, call the clinic.   BELOW ARE SYMPTOMS THAT SHOULD BE REPORTED IMMEDIATELY:  *FEVER GREATER THAN 100.5 F  *CHILLS WITH OR WITHOUT FEVER  NAUSEA AND VOMITING THAT IS NOT CONTROLLED WITH YOUR NAUSEA MEDICATION  *UNUSUAL SHORTNESS OF BREATH  *UNUSUAL BRUISING OR BLEEDING  TENDERNESS IN MOUTH AND THROAT WITH OR WITHOUT PRESENCE OF ULCERS  *URINARY PROBLEMS  *BOWEL PROBLEMS  UNUSUAL RASH Items with * indicate a potential emergency and should be followed up as soon as possible.  Feel free to call the clinic you have any questions or concerns. The clinic phone number is [phone].  Please show the CHEMO ALERT CARD at check-in to the Emergency Department and triage nurse.   

## 2015-06-18 ENCOUNTER — Telehealth: Payer: Self-pay

## 2015-06-18 NOTE — Telephone Encounter (Signed)
Pt called about her problems with emotional issues, as discussed with this RN while in infusion on 4/25.  Patient reports after much discussion with her husband and children, she would like to try to manage the emotional problems without increasing the venlafaxine.  She feels her issues have been caused by stress, menopause symptoms and the aromatase inhibitors.  She reports her husband and children have agreed to help reduce the stress in her life and she would really like to try this on her own before increasing meds.  She feels that now that she knows what to expect she can work through it.  She thought he life would return to normal and "was unprepared".   [facility] and did not have a good experience.  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  I advised patient we do not want her to feel bad and want to ensure she has the resources she needs.   Patient declined but voiced understanding and affirmed she would call if she needed the additional support resources.

## 2015-06-22 LAB — CULTURE, BLOOD (SINGLE)

## 2015-07-07 ENCOUNTER — Other Ambulatory Visit (HOSPITAL_BASED_OUTPATIENT_CLINIC_OR_DEPARTMENT_OTHER): Payer: Medicare Other

## 2015-07-07 ENCOUNTER — Telehealth: Payer: Self-pay | Admitting: Hematology and Oncology

## 2015-07-07 ENCOUNTER — Encounter: Payer: Self-pay | Admitting: *Deleted

## 2015-07-07 ENCOUNTER — Ambulatory Visit (HOSPITAL_BASED_OUTPATIENT_CLINIC_OR_DEPARTMENT_OTHER): Payer: Medicare Other | Admitting: Hematology and Oncology

## 2015-07-07 ENCOUNTER — Encounter: Payer: Self-pay | Admitting: Hematology and Oncology

## 2015-07-07 ENCOUNTER — Ambulatory Visit (HOSPITAL_BASED_OUTPATIENT_CLINIC_OR_DEPARTMENT_OTHER): Payer: Medicare Other

## 2015-07-07 VITALS — BP 125/73 | HR 72

## 2015-07-07 VITALS — BP 166/89 | HR 80 | Temp 97.9°F | Resp 18 | Wt 110.7 lb

## 2015-07-07 DIAGNOSIS — N951 Menopausal and female climacteric states: Secondary | ICD-10-CM | POA: Diagnosis not present

## 2015-07-07 DIAGNOSIS — M8580 Other specified disorders of bone density and structure, unspecified site: Secondary | ICD-10-CM | POA: Diagnosis not present

## 2015-07-07 DIAGNOSIS — C50211 Malignant neoplasm of upper-inner quadrant of right female breast: Secondary | ICD-10-CM

## 2015-07-07 DIAGNOSIS — Z5112 Encounter for antineoplastic immunotherapy: Secondary | ICD-10-CM

## 2015-07-07 DIAGNOSIS — F329 Major depressive disorder, single episode, unspecified: Secondary | ICD-10-CM

## 2015-07-07 LAB — CBC WITH DIFFERENTIAL/PLATELET
BASO%: 0.7 % (ref 0.0–2.0)
Basophils Absolute: 0 10*3/uL (ref 0.0–0.1)
EOS%: 1.6 % (ref 0.0–7.0)
Eosinophils Absolute: 0.1 10*3/uL (ref 0.0–0.5)
HEMATOCRIT: 43.4 % (ref 34.8–46.6)
HGB: 14.8 g/dL (ref 11.6–15.9)
LYMPH#: 1.3 10*3/uL (ref 0.9–3.3)
LYMPH%: 21.5 % (ref 14.0–49.7)
MCH: 33.9 pg (ref 25.1–34.0)
MCHC: 34.1 g/dL (ref 31.5–36.0)
MCV: 99.5 fL (ref 79.5–101.0)
MONO#: 0.8 10*3/uL (ref 0.1–0.9)
MONO%: 12.7 % (ref 0.0–14.0)
NEUT#: 3.9 10*3/uL (ref 1.5–6.5)
NEUT%: 63.5 % (ref 38.4–76.8)
PLATELETS: 258 10*3/uL (ref 145–400)
RBC: 4.36 10*6/uL (ref 3.70–5.45)
RDW: 15.1 % — AB (ref 11.2–14.5)
WBC: 6.1 10*3/uL (ref 3.9–10.3)

## 2015-07-07 LAB — COMPREHENSIVE METABOLIC PANEL
ALT: 22 U/L (ref 0–55)
ANION GAP: 9 meq/L (ref 3–11)
AST: 32 U/L (ref 5–34)
Albumin: 4.1 g/dL (ref 3.5–5.0)
Alkaline Phosphatase: 64 U/L (ref 40–150)
BUN: 9.1 mg/dL (ref 7.0–26.0)
CHLORIDE: 104 meq/L (ref 98–109)
CO2: 26 meq/L (ref 22–29)
Calcium: 9.9 mg/dL (ref 8.4–10.4)
Creatinine: 0.8 mg/dL (ref 0.6–1.1)
EGFR: 75 mL/min/{1.73_m2} — AB (ref >90)
Glucose: 101 mg/dl (ref 70–140)
POTASSIUM: 5 meq/L (ref 3.5–5.1)
Sodium: 139 mEq/L (ref 136–145)
TOTAL PROTEIN: 7.6 g/dL (ref 6.4–8.3)
Total Bilirubin: 0.42 mg/dL (ref 0.20–1.20)

## 2015-07-07 MED ORDER — ACETAMINOPHEN 325 MG PO TABS
650.0000 mg | ORAL_TABLET | Freq: Once | ORAL | Status: AC
  Administered 2015-07-07: 650 mg via ORAL

## 2015-07-07 MED ORDER — HEPARIN SOD (PORK) LOCK FLUSH 100 UNIT/ML IV SOLN
500.0000 [IU] | Freq: Once | INTRAVENOUS | Status: AC | PRN
  Administered 2015-07-07: 500 [IU]
  Filled 2015-07-07: qty 5

## 2015-07-07 MED ORDER — PALONOSETRON HCL INJECTION 0.25 MG/5ML
0.2500 mg | Freq: Once | INTRAVENOUS | Status: AC
  Administered 2015-07-07: 0.25 mg via INTRAVENOUS

## 2015-07-07 MED ORDER — TRASTUZUMAB CHEMO INJECTION 440 MG
6.0000 mg/kg | Freq: Once | INTRAVENOUS | Status: AC
  Administered 2015-07-07: 294 mg via INTRAVENOUS
  Filled 2015-07-07: qty 14

## 2015-07-07 MED ORDER — SODIUM CHLORIDE 0.9 % IJ SOLN
10.0000 mL | INTRAMUSCULAR | Status: DC | PRN
  Administered 2015-07-07: 10 mL
  Filled 2015-07-07: qty 10

## 2015-07-07 MED ORDER — ACETAMINOPHEN 325 MG PO TABS
ORAL_TABLET | ORAL | Status: AC
  Filled 2015-07-07: qty 2

## 2015-07-07 MED ORDER — PALONOSETRON HCL INJECTION 0.25 MG/5ML
INTRAVENOUS | Status: AC
  Filled 2015-07-07: qty 5

## 2015-07-07 MED ORDER — SODIUM CHLORIDE 0.9 % IV SOLN
Freq: Once | INTRAVENOUS | Status: AC
  Administered 2015-07-07: 09:00:00 via INTRAVENOUS

## 2015-07-07 MED ORDER — DIPHENHYDRAMINE HCL 25 MG PO CAPS
25.0000 mg | ORAL_CAPSULE | Freq: Once | ORAL | Status: AC
  Administered 2015-07-07: 25 mg via ORAL

## 2015-07-07 MED ORDER — DIPHENHYDRAMINE HCL 25 MG PO CAPS
ORAL_CAPSULE | ORAL | Status: AC
  Filled 2015-07-07: qty 1

## 2015-07-07 NOTE — Patient Instructions (Signed)
[facility] Discharge Instructions for Patients Receiving Chemotherapy  Today you received the following chemotherapy agents:  Herceptin  To help prevent nausea and vomiting after your treatment, we encourage you to take your nausea medication as ordered per MD.   If you develop nausea and vomiting that is not controlled by your nausea medication, call the clinic.   BELOW ARE SYMPTOMS THAT SHOULD BE REPORTED IMMEDIATELY:  *FEVER GREATER THAN 100.5 F  *CHILLS WITH OR WITHOUT FEVER  NAUSEA AND VOMITING THAT IS NOT CONTROLLED WITH YOUR NAUSEA MEDICATION  *UNUSUAL SHORTNESS OF BREATH  *UNUSUAL BRUISING OR BLEEDING  TENDERNESS IN MOUTH AND THROAT WITH OR WITHOUT PRESENCE OF ULCERS  *URINARY PROBLEMS  *BOWEL PROBLEMS  UNUSUAL RASH Items with * indicate a potential emergency and should be followed up as soon as possible.  Feel free to call the clinic you have any questions or concerns. The clinic phone number is [phone].  Please show the CHEMO ALERT CARD at check-in to the Emergency Department and triage nurse.   

## 2015-07-07 NOTE — Progress Notes (Signed)
Patient Care Team: [name], MD as PCP - General (Family Medicine)  .SUMMARY OF ONCOLOGIC HISTORY:   Breast cancer of upper-inner quadrant of right female breast ([place])   06/20/2014 Initial Diagnosis right breast 3:00: Invasive ductal carcinoma with DCIS, grade 2,ER 90%, PR 1%,HER-2 positive ratio 2.97   06/30/2014 Breast MRI Right breast 2.4 x 2.2 x 2.2 cm irregular mass, no abnormal lymph nodes   07/14/2014 - 10/28/2014 Neo-Adjuvant Chemotherapy TCH Perjeta every 3 weeks 6 followed by Herceptin maintenance   11/06/2014 Breast MRI Right breast mass has significantly decreased in size and is less masslike, maximal enhancement is 1.5 cm previously 2.4 cm   12/02/2014 Surgery Right lumpectomy: Residual IDC grade 3; 2.4 cm with DCIS high-grade, posterior margin probably positive for invasive cancer, 1/4 lymph nodes positive with extracapsular extension, T2 N1 stage IIB   12/29/2014 - 01/26/2015 Radiation Therapy Adjuvant radiation therapy   02/24/2015 -  Anti-estrogen oral therapy Anastrozole 1 mg daily    CHIEF COMPLIANT: depression much improved  INTERVAL HISTORY: Marilyn Dalton is a 75 year old with above-mentioned history of right breast cancer who underwent neoadjuvant chemotherapy followed by lumpectomy and radiation. She took anastrozole briefly and felt very depressed and we discontinued it. She has been on antidepressant with Effexor and has had remarkable improvement in her depression. She does have occasional anxiety attacks and nightmares.She has had recent issues with her jaw. She did see her dentist who diagnosed her withinfection and was treated with antibiotics. He informed her that she has osteoporosis and that the dental implant needs to come out. Patient is not very keen on removing the dental implant at this time because it is not causing him any trouble. She does have a submandibular lymph node which is new.  REVIEW OF SYSTEMS:   Constitutional: Denies fevers, chills or  abnormal weight loss Eyes: Denies blurriness of vision Ears, nose, mouth, throat, and face: Denies mucositis or sore throat, recent dental infection Respiratory: Denies cough, dyspnea or wheezes Cardiovascular: Denies palpitation, chest discomfort Gastrointestinal:  Denies nausea, heartburn or change in bowel habits Skin: Denies abnormal skin rashes Lymphatics: lymph node beneath the jaw Neurological:Denies numbness, tingling or new weaknesses Behavioral/Psych: Mood is stable, no new changes  Extremities: No lower extremity edema Breast:  denies any pain or lumps or nodules in either breasts All other systems were reviewed with the patient and are negative.  I have reviewed the past medical history, past surgical history, social history and family history with the patient and they are unchanged from previous note.  ALLERGIES:  has No Known Allergies.  MEDICATIONS:  Current Outpatient Prescriptions  Medication Sig Dispense Refill  . b complex vitamins tablet Take 1 tablet by mouth daily.    . Cholecalciferol (D3 ADULT PO) Take by mouth daily.    . diphenhydrAMINE (BENADRYL) 25 MG tablet Take 25 mg by mouth 2 (two) times daily.    . Fish Oil OIL 360 mg by Does not apply route daily.    [name] lidocaine-prilocaine (EMLA) cream Apply to affected area once 30 g 3  . Magnesium 250 MG TABS Take by mouth.    [name] PNEUMOVAX 23 25 MCG/0.5ML injection ADM 0.5ML IM UTD  0  . Potassium 99 MG TABS Take 595 mg by mouth daily.    [name] senna (SENOKOT) 8.6 MG TABS tablet Take 1 tablet by mouth.    [name] UNABLE TO FIND Med Name: tumeric 577m    . venlafaxine XR (EFFEXOR-XR) 75 MG 24  hr capsule Take 1 capsule (75 mg total) by mouth daily. 30 capsule 1  . vitamin E 400 UNIT capsule Take 400 Units by mouth daily.     No current facility-administered medications for this visit.   Facility-Administered Medications Ordered in Other Visits  Medication Dose Route Frequency Provider Last Rate Last Dose  . heparin lock  flush 100 unit/mL  500 Units Intracatheter Once PRN Nicholas Lose, MD      . sodium chloride 0.9 % injection 10 mL  10 mL Intracatheter PRN Nicholas Lose, MD      . trastuzumab (HERCEPTIN) 294 mg in sodium chloride 0.9 % 250 mL chemo infusion  6 mg/kg (Treatment Plan Actual) Intravenous Once Nicholas Lose, MD        PHYSICAL EXAMINATION: ECOG PERFORMANCE STATUS: 1 - Symptomatic but completely ambulatory  Filed Vitals:   07/07/15 0827  BP: 166/89  Pulse: 80  Temp: 97.9 F (36.6 C)  Resp: 18   Filed Weights   07/07/15 0827  Weight: 110 lb 11.2 oz (50.213 kg)    GENERAL:alert, no distress and comfortable SKIN: skin color, texture, turgor are normal, no rashes or significant lesions EYES: normal, Conjunctiva are pink and non-injected, sclera clear OROPHARYNX:no exudate, no erythema and lips, buccal mucosa, and tongue normal  NECK: supple, thyroid normal size, non-tender, without nodularity LYMPH:  no palpable lymphadenopathy in the cervical, axillary or inguinal LUNGS: clear to auscultation and percussion with normal breathing effort HEART: regular rate & rhythm and no murmurs and no lower extremity edema ABDOMEN:abdomen soft, non-tender and normal bowel sounds MUSCULOSKELETAL:no cyanosis of digits and no clubbing  NEURO: alert & oriented x 3 with fluent speech, no focal motor/sensory deficits EXTREMITIES: No lower extremity edema  LABORATORY DATA:  I have reviewed the data as listed   Chemistry      Component Value Date/Time   NA 139 07/07/2015 0813   NA 140 07/18/2014 0151   K 5.0 07/07/2015 0813   K 3.6 07/18/2014 0151   CL 102 07/18/2014 0151   CO2 26 07/07/2015 0813   CO2 27 07/10/2014 1120   BUN 9.1 07/07/2015 0813   BUN 19 07/18/2014 0151   CREATININE 0.8 07/07/2015 0813   CREATININE 0.70 07/18/2014 0151      Component Value Date/Time   CALCIUM 9.9 07/07/2015 0813   CALCIUM 10.1 07/10/2014 1120   ALKPHOS 64 07/07/2015 0813   ALKPHOS 70 07/10/2014 1120   AST 32  07/07/2015 0813   AST 25 07/10/2014 1120   ALT 22 07/07/2015 0813   ALT 19 07/10/2014 1120   BILITOT 0.42 07/07/2015 0813   BILITOT 0.5 07/10/2014 1120       Lab Results  Component Value Date   WBC 6.1 07/07/2015   HGB 14.8 07/07/2015   HCT 43.4 07/07/2015   MCV 99.5 07/07/2015   PLT 258 07/07/2015   NEUTROABS 3.9 07/07/2015     ASSESSMENT & PLAN:  Breast cancer of upper-inner quadrant of right female 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, 2.4 x 2.2 x 2.2 cm mass which abuts the pectoral muscle, grade 2, invasive ductal carcinoma, ER 90%, PR 1%, Ki-67 20%, HER-2 positive ratio 2.97, T2 N0 M0 stage II A clinical stage Status post TCH Perjeta 6 cycles started 07/14/2014 completed 10/28/2014 Right lumpectomy 12/02/2014: Residual IDC grade 3; 2.4 cm with DCIS high-grade, posterior margin probably positive for invasive cancer, 1/4 lymph nodes positive with extracapsular extension, T2 N1 stage IIB Adj XRT completed  01/26/15  Current treatment: 1. Adjuvant antiestrogen therapy with anastrozole started January 2017 2. 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 On proton pump inhibitor therapy  Anastrozole toxicities: 1. Hot flashes: Same as before 2. Profound emotional disturbances with depression and mood swings 3. Lack of taste, decreased energy and decreased appetite  Patient discontinued anastrozole 05/19/2015.   Recommendation:  Major depression: Effexor XR are 37.5 mg daily. Her depression has improved and she feels early normal with the exception of occasional anxiety attacks and nightmares.  Dental issues: Her dentist had informed her that she has osteoporosis. I would like to get a bone density test for further evaluation. If she truly has osteoporosis then I will send for tamoxifen instead of  anastrozole. She may also need to be on calcium and vitamin D plus/minus bisphosphonate therapy. She was instructed that she needs her implant removed the jaw. However patient is planning to not proceed with any additional dental surgeries at this time.  Submandibular lymph node: She reports that this came upsince her recent jaw infection. It has not gone away even though the joint infection is better. I asked her to see ENT for further evaluation. She is participating in the [facility] class and appears to be enjoying it.  Today is her last Herceptin treatment.  Return to clinic in 3 months for follow-up. Orders Placed This Encounter  Procedures  . DG Bone Density    EPIC ORDER PF: NONE NO NEEDS  CR/SHEMEKA  MEDICARE  NO CAL SUPP     Standing Status: Future     Number of Occurrences:      Standing Expiration Date: 07/06/2016    Order Specific Question:  Reason for Exam (SYMPTOM  OR DIAGNOSIS REQUIRED)    Answer:  Osteopenia evaluation    Order Specific Question:  Preferred imaging location?    Answer:  [facility]  . Ambulatory referral to ENT    Referral Priority:  Routine    Referral Type:  Consultation    Referral Reason:  Specialty Services Required    Referred to Provider:  Jodi Marble, MD    Requested Specialty:  Otolaryngology    Number of Visits Requested:  1  . ECHOCARDIOGRAM COMPLETE    Standing Status: Future     Number of Occurrences:      Standing Expiration Date: 10/05/2016    Order Specific Question:  Where should this test be performed    Answer:  [facility]    Order Specific Question:  Complete or Limited study?    Answer:  Complete    Order Specific Question:  With Image Enhancing Agent or without Image Enhancing Agent?    Answer:  With Image Enhancing Agent    Order Specific Question:  Reason for exam-Echo    Answer:  Chemo  V67.2 / Z09   The patient has a good understanding of the overall plan. she agrees with it. she will call with any problems that may  develop before the next visit here.   Rulon Eisenmenger, MD 07/07/2015

## 2015-07-07 NOTE — Telephone Encounter (Signed)
Made all appts ..the patient is established with Dr. Claiborne Rigg per MD Lindi Adie ok to keep that Dr.

## 2015-07-07 NOTE — Assessment & Plan Note (Signed)
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 Adjuvant antiestrogen therapy with anastrozole started January 2017 2. 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 On proton pump inhibitor therapy  Anastrozole toxicities: 1. Hot flashes: Same as before 2. Profound emotional disturbances with depression and mood swings 3. Lack of taste, decreased energy and decreased appetite  Patient discontinued anastrozole 05/19/2015.   Recommendation:  Major depression: Effexor X are 37.5 mg daily. Once her depression gets better, we will address restarting anti-estrogen therapy because I feel very strongly that she needs to be on anti-estrogens given the extent of her disease post neoadjuvant chemotherapy.  Denies any myalgias.  Return to clinic in 6 weeks for follow-up and every 3 weeks for Herceptin

## 2015-07-08 ENCOUNTER — Telehealth: Payer: Self-pay | Admitting: Hematology and Oncology

## 2015-07-08 NOTE — Telephone Encounter (Signed)
lvm for pt regarding to to echo, bone density, md....pt ok and aware

## 2015-07-09 DIAGNOSIS — F17200 Nicotine dependence, unspecified, uncomplicated: Secondary | ICD-10-CM | POA: Insufficient documentation

## 2015-07-09 DIAGNOSIS — Z79890 Hormone replacement therapy (postmenopausal): Secondary | ICD-10-CM | POA: Insufficient documentation

## 2015-07-09 DIAGNOSIS — J309 Allergic rhinitis, unspecified: Secondary | ICD-10-CM | POA: Insufficient documentation

## 2015-07-15 ENCOUNTER — Ambulatory Visit
Admission: RE | Admit: 2015-07-15 | Discharge: 2015-07-15 | Disposition: A | Discharge status: Home / Self Care | Payer: Medicare Other | Source: Ambulatory Visit | Attending: Hematology and Oncology | Admitting: Hematology and Oncology

## 2015-07-15 DIAGNOSIS — C50211 Malignant neoplasm of upper-inner quadrant of right female breast: Secondary | ICD-10-CM

## 2015-08-07 ENCOUNTER — Other Ambulatory Visit: Payer: Self-pay | Admitting: Otolaryngology

## 2015-08-07 DIAGNOSIS — L040 Acute lymphadenitis of face, head and neck: Secondary | ICD-10-CM

## 2015-08-17 ENCOUNTER — Ambulatory Visit
Admission: RE | Admit: 2015-08-17 | Discharge: 2015-08-17 | Disposition: A | Discharge status: Home / Self Care | Payer: Medicare Other | Source: Ambulatory Visit | Attending: Otolaryngology | Admitting: Otolaryngology

## 2015-08-17 DIAGNOSIS — L040 Acute lymphadenitis of face, head and neck: Secondary | ICD-10-CM

## 2015-08-17 MED ORDER — IOPAMIDOL (ISOVUE-300) INJECTION 61%
75.0000 mL | Freq: Once | INTRAVENOUS | Status: AC | PRN
  Administered 2015-08-17: 75 mL via INTRAVENOUS

## 2015-08-25 ENCOUNTER — Other Ambulatory Visit: Payer: Self-pay | Admitting: Nurse Practitioner

## 2015-08-26 ENCOUNTER — Other Ambulatory Visit: Payer: Self-pay | Admitting: Otolaryngology

## 2015-08-26 ENCOUNTER — Ambulatory Visit (HOSPITAL_COMMUNITY): Payer: Medicare Other

## 2015-08-31 ENCOUNTER — Ambulatory Visit (HOSPITAL_COMMUNITY)
Admission: RE | Admit: 2015-08-31 | Discharge: 2015-08-31 | Disposition: A | Discharge status: Home / Self Care | Payer: Medicare Other | Source: Ambulatory Visit | Attending: Hematology and Oncology | Admitting: Hematology and Oncology

## 2015-08-31 ENCOUNTER — Other Ambulatory Visit: Payer: Self-pay | Admitting: Hematology and Oncology

## 2015-08-31 DIAGNOSIS — C50211 Malignant neoplasm of upper-inner quadrant of right female breast: Secondary | ICD-10-CM

## 2015-08-31 DIAGNOSIS — I071 Rheumatic tricuspid insufficiency: Secondary | ICD-10-CM | POA: Diagnosis not present

## 2015-08-31 DIAGNOSIS — I358 Other nonrheumatic aortic valve disorders: Secondary | ICD-10-CM | POA: Insufficient documentation

## 2015-08-31 DIAGNOSIS — T451X5D Adverse effect of antineoplastic and immunosuppressive drugs, subsequent encounter: Secondary | ICD-10-CM | POA: Diagnosis not present

## 2015-08-31 NOTE — Progress Notes (Signed)
Echocardiogram 2D Echocardiogram limited has been performed.  Tresa Res 08/31/2015, 11:29 AM

## 2015-10-02 ENCOUNTER — Encounter: Payer: Self-pay | Admitting: Hematology and Oncology

## 2015-10-02 NOTE — Progress Notes (Unsigned)
Patient's spouse called about insurance denial for echo. Emailed Freda Munro in coding to follow up and contact him directly.

## 2015-10-06 ENCOUNTER — Encounter: Payer: Self-pay | Admitting: Hematology and Oncology

## 2015-10-06 ENCOUNTER — Ambulatory Visit (HOSPITAL_BASED_OUTPATIENT_CLINIC_OR_DEPARTMENT_OTHER): Payer: Medicare Other | Admitting: Hematology and Oncology

## 2015-10-06 ENCOUNTER — Telehealth: Payer: Self-pay | Admitting: Hematology and Oncology

## 2015-10-06 ENCOUNTER — Encounter: Payer: Self-pay | Admitting: *Deleted

## 2015-10-06 DIAGNOSIS — F329 Major depressive disorder, single episode, unspecified: Secondary | ICD-10-CM | POA: Diagnosis not present

## 2015-10-06 DIAGNOSIS — K2970 Gastritis, unspecified, without bleeding: Secondary | ICD-10-CM | POA: Diagnosis not present

## 2015-10-06 DIAGNOSIS — C50211 Malignant neoplasm of upper-inner quadrant of right female breast: Secondary | ICD-10-CM

## 2015-10-06 NOTE — Telephone Encounter (Signed)
appt made and avs printed °

## 2015-10-06 NOTE — Assessment & Plan Note (Signed)
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 Adjuvant antiestrogen therapy with anastrozole started January 2017 stopped March 2017 2. 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 On proton pump inhibitor therapy  Anastrozole toxicities: Hot flashes, Profound emotional disturbances with depression and mood swings, Lack of taste, decreased energy and decreased appetite  Patient discontinued anastrozole 05/19/2015.   Major depression: Effexor XR are 37.5 mg daily. Her depression has improved and she feels early normal with the exception of occasional anxiety attacks and nightmares.  Bone density 07/15/2015: Normal CT neck 08/17/2015: Right submandibular region 1 cm ill-defined structure which could be a lymph node of digastric muscle: ENT evaluation.  Treatment plan: I discussed with her about reinitiating antiestrogen therapy and we discussed different options including letrozole or exemestane.  Return to clinic in

## 2015-10-06 NOTE — Progress Notes (Signed)
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%,HER-2 positive ratio 2.97     06/30/2014 Breast MRI    Right breast 2.4 x 2.2 x 2.2 cm irregular mass, no abnormal lymph nodes     07/14/2014 - 10/28/2014 Neo-Adjuvant Chemotherapy    TCH Perjeta every 3 weeks 6 followed by Herceptin maintenance     11/06/2014 Breast MRI    Right breast mass has significantly decreased in size and is less masslike, maximal enhancement is 1.5 cm previously 2.4 cm     12/02/2014 Surgery    Right lumpectomy: Residual IDC grade 3; 2.4 cm with DCIS high-grade, posterior margin probably positive for invasive cancer, 1/4 lymph nodes positive with extracapsular extension, T2 N1 stage IIB     12/29/2014 - 01/26/2015 Radiation Therapy    Adjuvant radiation therapy     02/24/2015 -  Anti-estrogen oral therapy    Anastrozole 1 mg daily      CHIEF COMPLIANT: Follow-up on anastrozole  INTERVAL HISTORY: Marilyn Dalton is a 75 year old with above-mentioned history of right breast cancer treated with neoadjuvant chemotherapy followed by lumpectomy and radiation but could not tolerate antiestrogen therapy because of severe emotional problems and mood swings. All of which have resolved after she had discontinued with the therapy. She is feeling a lot better today. She continues to have hot flashes. Physical activities have resolved back to normal.  REVIEW OF SYSTEMS:   Constitutional: Denies fevers, chills or abnormal weight loss Eyes: Denies blurriness of vision Ears, nose, mouth, throat, and face: Denies mucositis or sore throat Respiratory: Denies cough, dyspnea or wheezes Cardiovascular: Denies palpitation, chest discomfort Gastrointestinal:  Denies nausea, heartburn or change in  bowel habits Skin: Denies abnormal skin rashes Lymphatics: Denies new lymphadenopathy or easy bruising Neurological:Denies numbness, tingling or new weaknesses Behavioral/Psych: Mood is stable, no new changes  Extremities: No lower extremity edema Breast:  denies any pain or lumps or nodules in either breasts All other systems were reviewed with the patient and are negative.  I have reviewed the past medical history, past surgical history, social history and family history with the patient and they are unchanged from previous note.  ALLERGIES:  has No Known Allergies.  MEDICATIONS:  Current Outpatient Prescriptions  Medication Sig Dispense Refill  . b complex vitamins tablet Take 1 tablet by mouth daily.    . Cholecalciferol (D3 ADULT PO) Take by mouth daily.    . diphenhydrAMINE (BENADRYL) 25 MG tablet Take 25 mg by mouth 2 (two) times daily.    . Fish Oil OIL 360 mg by Does not apply route daily.    [name] lidocaine-prilocaine (EMLA) cream Apply to affected area once 30 g 3  . Magnesium 250 MG TABS Take by mouth.    [name] PNEUMOVAX 23 25 MCG/0.5ML injection ADM 0.5ML IM UTD  0  . Potassium 99 MG TABS Take 595 mg by mouth daily.    [name] senna (SENOKOT) 8.6 MG TABS tablet Take 1 tablet by mouth.    [name] UNABLE TO FIND Med Name: tumeric 510m    . venlafaxine XR (EFFEXOR-XR) 75 MG 24 hr capsule Take 1 capsule (75 mg total) by mouth daily. 30 capsule 1  . vitamin E 400 UNIT capsule Take 400 Units  by mouth daily.     No current facility-administered medications for this visit.     PHYSICAL EXAMINATION: ECOG PERFORMANCE STATUS: 0 - Asymptomatic  Vitals:   10/06/15 0902  BP: (!) 150/87  Pulse: 90  Resp: 18  Temp: 98.3 F (36.8 C)   Filed Weights   10/06/15 0902  Weight: 109 lb 1.6 oz (49.5 kg)    GENERAL:alert, no distress and comfortable SKIN: skin color, texture, turgor are normal, no rashes or significant lesions EYES: normal, Conjunctiva are pink and non-injected, sclera  clear OROPHARYNX:no exudate, no erythema and lips, buccal mucosa, and tongue normal  NECK: supple, thyroid normal size, non-tender, without nodularity LYMPH:  no palpable lymphadenopathy in the cervical, axillary or inguinal LUNGS: clear to auscultation and percussion with normal breathing effort HEART: regular rate & rhythm and no murmurs and no lower extremity edema ABDOMEN:abdomen soft, non-tender and normal bowel sounds MUSCULOSKELETAL:no cyanosis of digits and no clubbing  NEURO: alert & oriented x 3 with fluent speech, no focal motor/sensory deficits EXTREMITIES: No lower extremity edema BREAST: No palpable masses or nodules in either right or left breasts. No palpable axillary supraclavicular or infraclavicular adenopathy no breast tenderness or nipple discharge. (exam performed in the presence of a chaperone)  LABORATORY DATA:  I have reviewed the data as listed   Chemistry      Component Value Date/Time   NA 139 07/07/2015 0813   K 5.0 07/07/2015 0813   CL 102 07/18/2014 0151   CO2 26 07/07/2015 0813   BUN 9.1 07/07/2015 0813   CREATININE 0.8 07/07/2015 0813      Component Value Date/Time   CALCIUM 9.9 07/07/2015 0813   ALKPHOS 64 07/07/2015 0813   AST 32 07/07/2015 0813   ALT 22 07/07/2015 0813   BILITOT 0.42 07/07/2015 0813       Lab Results  Component Value Date   WBC 6.1 07/07/2015   HGB 14.8 07/07/2015   HCT 43.4 07/07/2015   MCV 99.5 07/07/2015   PLT 258 07/07/2015   NEUTROABS 3.9 07/07/2015     ASSESSMENT & PLAN:  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 Adjuvant antiestrogen therapy with anastrozole started January 2017 stopped March 2017 2. 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 On proton pump inhibitor therapy  Anastrozole toxicities: Hot flashes, Profound emotional disturbances with depression and mood swings, Lack of taste, decreased energy and decreased appetite  Patient discontinued anastrozole 05/19/2015.   Major depression: Effexor XR are 37.5 mg daily. Her depression has improved and she feels early normal with the exception of occasional anxiety attacks and nightmares.  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  Return to clinic in 6 months for follow-up  No orders of the defined types were placed in this encounter.  The patient has a good understanding of the overall plan. she agrees with it. she will call with any problems that may develop before the next visit here.   Rulon Eisenmenger, MD 10/06/15

## 2015-12-29 ENCOUNTER — Other Ambulatory Visit: Payer: Self-pay | Admitting: *Deleted

## 2015-12-29 ENCOUNTER — Telehealth: Payer: Self-pay | Admitting: *Deleted

## 2015-12-29 DIAGNOSIS — Z170 Estrogen receptor positive status [ER+]: Principal | ICD-10-CM

## 2015-12-29 DIAGNOSIS — C50211 Malignant neoplasm of upper-inner quadrant of right female breast: Secondary | ICD-10-CM

## 2015-12-29 NOTE — Telephone Encounter (Signed)
Received call from patient stating she has been having right hip pain for about a month.  She states it hurts to walk and lay on her right side.  She has arthritis in her knee but she states that her hip feels different and she is very worried about it.  Per Elzie Rings we will order an hip xray and get the results and if they are negative she can stop anastrozole for a couple weeks and then re-evaluate.   Patient verbalized understanding.

## 2015-12-31 ENCOUNTER — Ambulatory Visit (HOSPITAL_COMMUNITY)
Admission: RE | Admit: 2015-12-31 | Discharge: 2015-12-31 | Disposition: A | Discharge status: Home / Self Care | Payer: Medicare Other | Source: Ambulatory Visit | Attending: Adult Health | Admitting: Adult Health

## 2015-12-31 DIAGNOSIS — C50211 Malignant neoplasm of upper-inner quadrant of right female breast: Secondary | ICD-10-CM | POA: Diagnosis not present

## 2015-12-31 DIAGNOSIS — Z170 Estrogen receptor positive status [ER+]: Secondary | ICD-10-CM | POA: Diagnosis present

## 2015-12-31 DIAGNOSIS — M1611 Unilateral primary osteoarthritis, right hip: Secondary | ICD-10-CM | POA: Insufficient documentation

## 2016-01-01 ENCOUNTER — Other Ambulatory Visit: Payer: Self-pay | Admitting: Hematology and Oncology

## 2016-01-01 DIAGNOSIS — F332 Major depressive disorder, recurrent severe without psychotic features: Secondary | ICD-10-CM

## 2016-01-06 ENCOUNTER — Telehealth: Payer: Self-pay | Admitting: Hematology and Oncology

## 2016-01-06 NOTE — Telephone Encounter (Signed)
Called to confirm LTS appt per LOS. Pt stated she does not need appt and will be on vacation the week of Dec 11 as per stated in LOS. canceled appt per pt request

## 2016-01-25 ENCOUNTER — Other Ambulatory Visit: Payer: Self-pay | Admitting: Hematology and Oncology

## 2016-01-25 DIAGNOSIS — F332 Major depressive disorder, recurrent severe without psychotic features: Secondary | ICD-10-CM

## 2016-01-25 DIAGNOSIS — C50211 Malignant neoplasm of upper-inner quadrant of right female breast: Secondary | ICD-10-CM

## 2016-02-04 ENCOUNTER — Encounter: Payer: Medicare Other | Admitting: Adult Health

## 2016-03-03 ENCOUNTER — Other Ambulatory Visit: Payer: Self-pay | Admitting: Hematology and Oncology

## 2016-03-03 DIAGNOSIS — F332 Major depressive disorder, recurrent severe without psychotic features: Secondary | ICD-10-CM

## 2016-03-21 ENCOUNTER — Telehealth: Payer: Self-pay | Admitting: *Deleted

## 2016-03-21 NOTE — Telephone Encounter (Signed)
Received call from patient stating she has had some seizures and blood pressure issues.  She states she hasn't seen a MD about her blood pressure she has been monitoring at home.  As far as the seizures, she states she had some tremors but her doctor retired and she hasn't seen anyone else.  Instructed her to call her doctor's office to get an appointment ASAP.  She needs to find out why she is having these issues and may need other tests run.  She was under the impression that Dr. Lindi Adie could treat everything.  Informed her she could stop the anastrozole until she sees him 2/13. Patient verbalized understanding.

## 2016-04-05 ENCOUNTER — Ambulatory Visit (HOSPITAL_BASED_OUTPATIENT_CLINIC_OR_DEPARTMENT_OTHER): Payer: Medicare Other | Admitting: Hematology and Oncology

## 2016-04-05 ENCOUNTER — Encounter: Payer: Self-pay | Admitting: Hematology and Oncology

## 2016-04-05 DIAGNOSIS — C50211 Malignant neoplasm of upper-inner quadrant of right female breast: Secondary | ICD-10-CM

## 2016-04-05 DIAGNOSIS — F332 Major depressive disorder, recurrent severe without psychotic features: Secondary | ICD-10-CM

## 2016-04-05 DIAGNOSIS — M1611 Unilateral primary osteoarthritis, right hip: Secondary | ICD-10-CM | POA: Diagnosis not present

## 2016-04-05 DIAGNOSIS — K2970 Gastritis, unspecified, without bleeding: Secondary | ICD-10-CM | POA: Diagnosis not present

## 2016-04-05 DIAGNOSIS — Z170 Estrogen receptor positive status [ER+]: Principal | ICD-10-CM

## 2016-04-05 MED ORDER — VENLAFAXINE HCL ER 75 MG PO CP24: 75.0000 mg | ORAL_CAPSULE | Freq: Every day | ORAL | 1 refills | Status: DC

## 2016-04-05 NOTE — Assessment & Plan Note (Signed)
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 Adjuvant antiestrogen therapy with anastrozole started January 2017 stopped March 2017 2. 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 On proton pump inhibitor therapy  Anastrozole toxicities: Hot flashes, Profound emotional disturbances with depression and mood swings, Lack of taste, decreased energy and decreased appetite  Patient discontinued anastrozole 05/19/2015.   Major depression: Effexor XR are 37.5 mg daily. Her depression has improved and she feels early normal with the exception of occasional anxiety attacks and nightmares.  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  Return to clinic in 6 months for follow-up

## 2016-04-05 NOTE — Progress Notes (Signed)
Patient Care Team: [name], MD as PCP - General (Family Medicine)  DIAGNOSIS:  Encounter Diagnoses  Name Primary?  . Malignant neoplasm of upper-inner quadrant of right breast in female, estrogen receptor positive ([place])   . Severe episode of recurrent major depressive disorder, without psychotic features ([place])     SUMMARY OF ONCOLOGIC HISTORY:   Breast cancer of upper-inner quadrant of right female breast ([place])   06/20/2014 Initial Diagnosis    right breast 3:00: Invasive ductal carcinoma with DCIS, grade 2,ER 90%, PR 1%,HER-2 positive ratio 2.97      06/30/2014 Breast MRI    Right breast 2.4 x 2.2 x 2.2 cm irregular mass, no abnormal lymph nodes      07/14/2014 - 10/28/2014 Neo-Adjuvant Chemotherapy    TCH Perjeta every 3 weeks 6 followed by Herceptin maintenance      11/06/2014 Breast MRI    Right breast mass has significantly decreased in size and is less masslike, maximal enhancement is 1.5 cm previously 2.4 cm      12/02/2014 Surgery    Right lumpectomy: Residual IDC grade 3; 2.4 cm with DCIS high-grade, posterior margin probably positive for invasive cancer, 1/4 lymph nodes positive with extracapsular extension, T2 N1 stage IIB      12/29/2014 - 01/26/2015 Radiation Therapy    Adjuvant radiation therapy      02/24/2015 - 05/19/2015 Anti-estrogen oral therapy    Anastrozole 1 mg daily stopped due to hot flashes, emotional disturbances, decreased energy and decreased appetite       CHIEF COMPLIANT: Profound emotional distress tearfulness, right hip pain, cataract surgery recently causing photophobia  INTERVAL HISTORY: Marilyn Dalton is a 76 year old with above-mentioned history of right breast cancer treated with neoadjuvant chemotherapy followed by lumpectomy and radiation. She went on anastrozole therapy but could not tolerate it and she stopped it and she resumed it back and she could not some tolerated and recently she has been off of it. It appears that she  has been profoundly depressed and tearful. This is in spite of being on Effexor 37.5 mg. There are many issues happening at the same time including the fact that her dog is dying from cancer and she still does not feel physically able to do much activity. She has profound right hip pain which appears to be bothering her and at previous x-ray showed arthritis. She is also having marital issues and she is getting marriage counseling  REVIEW OF SYSTEMS:   Constitutional: Denies fevers, chills or abnormal weight loss, generalized fatigue and weakness Eyes: Denies blurriness of vision Ears, nose, mouth, throat, and face: Denies mucositis or sore throat Respiratory: Denies cough, dyspnea or wheezes Cardiovascular: Denies palpitation, chest discomfort Gastrointestinal:  Denies nausea, heartburn or change in bowel habits Skin: Denies abnormal skin rashes Lymphatics: Denies new lymphadenopathy or easy bruising Neurological:Denies numbness, tingling or new weaknesses Behavioral/Psych: Severe depression  Extremities: No lower extremity edema, right hip pain Breast:  denies any pain or lumps or nodules in either breasts All other systems were reviewed with the patient and are negative.  I have reviewed the past medical history, past surgical history, social history and family history with the patient and they are unchanged from previous note.  ALLERGIES:  has No Known Allergies.  MEDICATIONS:  Current Outpatient Prescriptions  Medication Sig Dispense Refill  . anastrozole (ARIMIDEX) 1 MG tablet TAKE 1 TABLET(1 MG) BY MOUTH DAILY 90 tablet 0  . b complex vitamins tablet Take 1 tablet by mouth  daily.    . Cholecalciferol (D3 ADULT PO) Take by mouth daily.    . diphenhydrAMINE (BENADRYL) 25 MG tablet Take 25 mg by mouth 2 (two) times daily.    . Fish Oil OIL 360 mg by Does not apply route daily.    [name] lidocaine-prilocaine (EMLA) cream Apply to affected area once 30 g 3  . Magnesium 250 MG TABS Take by  mouth.    [name] PNEUMOVAX 23 25 MCG/0.5ML injection ADM 0.5ML IM UTD  0  . Potassium 99 MG TABS Take 595 mg by mouth daily.    [name] senna (SENOKOT) 8.6 MG TABS tablet Take 1 tablet by mouth.    [name] UNABLE TO FIND Med Name: tumeric 579m    . venlafaxine XR (EFFEXOR-XR) 75 MG 24 hr capsule Take 1 capsule (75 mg total) by mouth daily. 30 capsule 1  . vitamin E 400 UNIT capsule Take 400 Units by mouth daily.     No current facility-administered medications for this visit.     PHYSICAL EXAMINATION: ECOG PERFORMANCE STATUS: 1 - Symptomatic but completely ambulatory  Vitals:   04/05/16 1332  BP: (!) 151/93  Pulse: 93  Resp: 18  Temp: 97.5 F (36.4 C)   Filed Weights   04/05/16 1332  Weight: 109 lb 11.2 oz (49.8 kg)    GENERAL:alert, no distress and comfortable SKIN: skin color, texture, turgor are normal, no rashes or significant lesions EYES: normal, Conjunctiva are pink and non-injected, sclera clear OROPHARYNX:no exudate, no erythema and lips, buccal mucosa, and tongue normal  NECK: supple, thyroid normal size, non-tender, without nodularity LYMPH:  no palpable lymphadenopathy in the cervical, axillary or inguinal LUNGS: clear to auscultation and percussion with normal breathing effort HEART: regular rate & rhythm and no murmurs and no lower extremity edema ABDOMEN:abdomen soft, non-tender and normal bowel sounds MUSCULOSKELETAL:no cyanosis of digits and no clubbing  NEURO: alert & oriented x 3 with fluent speech, no focal motor/sensory deficits EXTREMITIES: No lower extremity edema  LABORATORY DATA:  I have reviewed the data as listed   Chemistry      Component Value Date/Time   NA 139 07/07/2015 0813   K 5.0 07/07/2015 0813   CL 102 07/18/2014 0151   CO2 26 07/07/2015 0813   BUN 9.1 07/07/2015 0813   CREATININE 0.8 07/07/2015 0813      Component Value Date/Time   CALCIUM 9.9 07/07/2015 0813   ALKPHOS 64 07/07/2015 0813   AST 32 07/07/2015 0813   ALT 22 07/07/2015 0813    BILITOT 0.42 07/07/2015 0813       Lab Results  Component Value Date   WBC 6.1 07/07/2015   HGB 14.8 07/07/2015   HCT 43.4 07/07/2015   MCV 99.5 07/07/2015   PLT 258 07/07/2015   NEUTROABS 3.9 07/07/2015    ASSESSMENT & PLAN:  Breast cancer of upper-inner quadrant of right female 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, 2.4 x 2.2 x 2.2 cm mass which abuts the pectoral muscle, grade 2, invasive ductal carcinoma, ER 90%, PR 1%, Ki-67 20%, HER-2 positive ratio 2.97, T2 N0 M0 stage II A clinical stage Status post TCH Perjeta 6 cycles started 07/14/2014 completed 10/28/2014 Right lumpectomy 12/02/2014: Residual IDC grade 3; 2.4 cm with DCIS high-grade, posterior margin probably positive for invasive cancer, 1/4 lymph nodes positive with extracapsular extension, T2 N1 stage IIB Adj XRT completed 01/26/15  Current treatment: 1. Adjuvant antiestrogen therapy with anastrozole started January 2017 stopped March 2017  2. 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 On proton pump inhibitor therapy Major depression: Effexor XR are 37.5 mg daily. Her depression has not improved. I recommended increasing the dosage to 75 mg. If this does not improve then she might need to see psychiatry. The patient is very reluctant to consider seeing a psychiatrist. Severe right hip pain: X-ray showed arthritis. I will refer her to orthopedic specialists to consider joint injections.  Bone density 07/15/2015: Normal  Return to clinic in 3 months for follow-up. This time she was discontinued anastrozole but she would be willing to consider tamoxifen if her general symptoms improve.   I spent 25 minutes talking to the patient of which more than half was spent in counseling and coordination of care.  No orders of the defined types were placed in this  encounter.  The patient has a good understanding of the overall plan. she agrees with it. she will call with any problems that may develop before the next visit here.   Rulon Eisenmenger, MD 04/05/16

## 2016-04-07 ENCOUNTER — Other Ambulatory Visit: Payer: Self-pay | Admitting: Emergency Medicine

## 2016-04-07 DIAGNOSIS — Z170 Estrogen receptor positive status [ER+]: Principal | ICD-10-CM

## 2016-04-07 DIAGNOSIS — C50211 Malignant neoplasm of upper-inner quadrant of right female breast: Secondary | ICD-10-CM

## 2016-04-08 ENCOUNTER — Other Ambulatory Visit: Payer: Self-pay | Admitting: General Surgery

## 2016-04-08 DIAGNOSIS — C50911 Malignant neoplasm of unspecified site of right female breast: Secondary | ICD-10-CM

## 2016-04-14 ENCOUNTER — Ambulatory Visit
Admission: RE | Admit: 2016-04-14 | Discharge: 2016-04-14 | Disposition: A | Discharge status: Home / Self Care | Payer: Medicare Other | Source: Ambulatory Visit | Attending: General Surgery | Admitting: General Surgery

## 2016-04-14 DIAGNOSIS — C50911 Malignant neoplasm of unspecified site of right female breast: Secondary | ICD-10-CM

## 2016-04-18 ENCOUNTER — Telehealth: Payer: Self-pay | Admitting: Hematology and Oncology

## 2016-04-18 NOTE — Telephone Encounter (Signed)
Faxed records to Dr. Wynelle Link

## 2016-05-11 IMAGING — DX DG CHEST 1V PORT
1 series · 1 of 1 positions shown · non-contrast
Comparison: 06/20/2013

CLINICAL DATA: Status post left port placement, history of breast
carcinoma

EXAM:
PORTABLE CHEST - 1 VIEW

[chest ap]
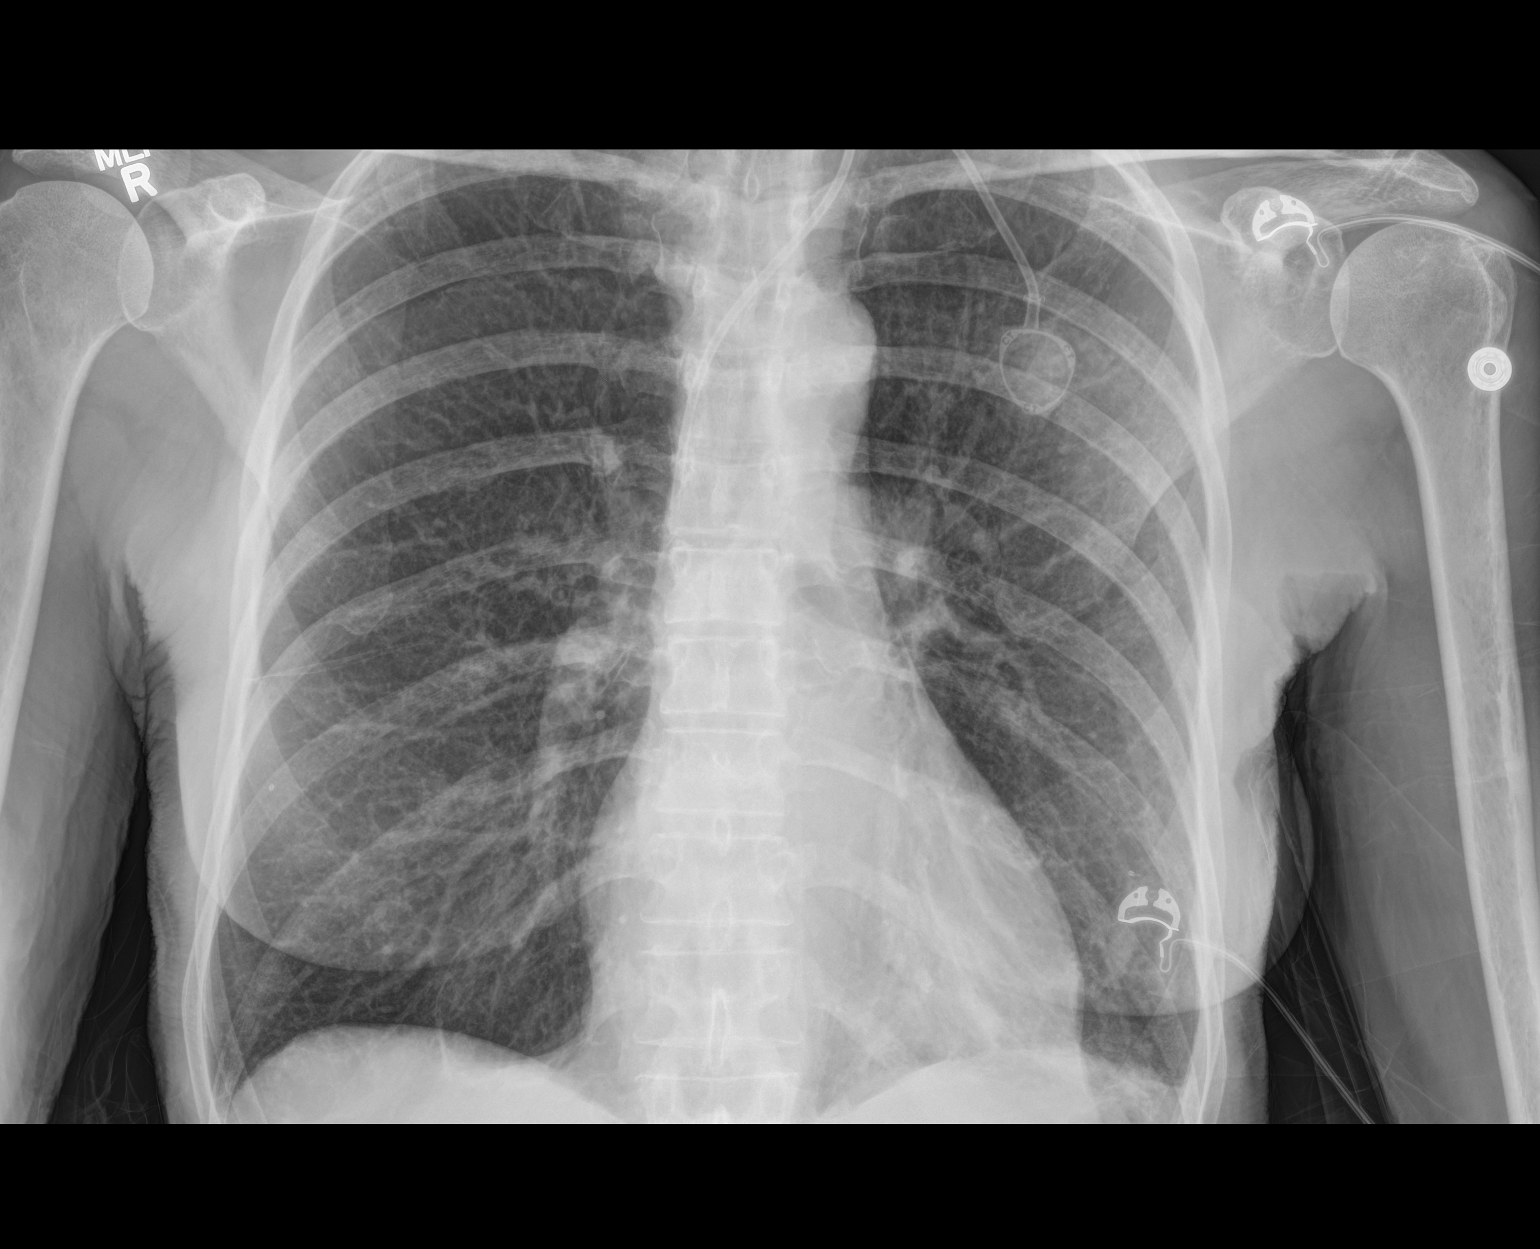

[1 of 1 positions shown; findings below may reference images not displayed]

FINDINGS: Cardiac shadow is within normal limits. The lungs are well aerated
bilaterally. Minimal left basilar atelectasis is noted. No
pneumothorax is seen. A left chest wall port is now seen with the
catheter tip in the mid superior vena cava. There is a somewhat high
oriented loop within the neck incompletely evaluated on this exam.
IMPRESSION: Status post port placement. No pneumothorax is noted. The superior
most aspect of the catheter loop in the neck is not evaluated on
this image. If clinically indicated this could be imaged with soft
tissue neck films.

These results were called by telephone at the time of interpretation
on 07/11/2014 at [DATE] to Asnong, the PACU nurse, who verbally
acknowledged these results.

## 2016-05-30 ENCOUNTER — Telehealth: Payer: Self-pay | Admitting: *Deleted

## 2016-05-30 ENCOUNTER — Other Ambulatory Visit: Payer: Self-pay | Admitting: *Deleted

## 2016-05-30 DIAGNOSIS — Z170 Estrogen receptor positive status [ER+]: Principal | ICD-10-CM

## 2016-05-30 DIAGNOSIS — C50211 Malignant neoplasm of upper-inner quadrant of right female breast: Secondary | ICD-10-CM

## 2016-05-30 MED ORDER — LETROZOLE 2.5 MG PO TABS: 2.5000 mg | ORAL_TABLET | Freq: Every day | ORAL | 3 refills | Status: DC

## 2016-05-30 NOTE — Telephone Encounter (Signed)
Spoke with patient and informed her we would call in femara for her to try.  Explained to her that she could take 1/2 tablet to start out with and see how she does with that. She verbalized understanding.

## 2016-06-14 ENCOUNTER — Other Ambulatory Visit: Payer: Self-pay | Admitting: Hematology and Oncology

## 2016-06-14 DIAGNOSIS — Z170 Estrogen receptor positive status [ER+]: Secondary | ICD-10-CM

## 2016-06-14 DIAGNOSIS — C50211 Malignant neoplasm of upper-inner quadrant of right female breast: Secondary | ICD-10-CM

## 2016-06-14 DIAGNOSIS — F332 Major depressive disorder, recurrent severe without psychotic features: Secondary | ICD-10-CM

## 2016-07-07 ENCOUNTER — Encounter: Payer: Self-pay | Admitting: *Deleted

## 2016-07-07 NOTE — Assessment & Plan Note (Signed)
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 Adjuvant antiestrogen therapy with anastrozole started January 2017 stopped March 2017 2. 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 On proton pump inhibitor therapy Major depression: Effexor XR are 37.5 mg daily. Her depression has not improved. I recommended increasing the dosage to 75 mg. If this does not improve then she might need to see psychiatry. The patient is very reluctant to consider seeing a psychiatrist. Severe right hip pain: X-ray showed arthritis. I will refer her to orthopedic specialists to consider joint injections.  Bone density 07/15/2015: Normal  Return to clinic in 3 months for follow-up. This time she was discontinued anastrozole but she would be willing to consider tamoxifen if her general symptoms improve.

## 2016-07-07 NOTE — Progress Notes (Signed)
[place] Work  Clinical Social Work was referred by patient for possible counseling.  Clinical Social Worker contacted patient at home to offer support and assess for needs. Pt shared she has ongoing concerns related to her cancer and was directed by Breast Cancer Support group facilitator to seek additional support. Pt feels her depression has improved with the increase dose of her Effexor. She continues to do couples therapy with her husband and feels this is helpful. She described "being stuck" and "needs new goals". She was educated on counseling options through [facility] and pt is open to seeing counseling intern/PhD student for brief counseling support. She agrees to referral, which CSW will complete. CSW also encouraged pt to consider requesting additional counselor through therapist that she and her husband see together, if ongoing support is needed afterwards. She stated understanding and agrees to reach out to CSW as needed.      Clinical Social Work interventions: Supportive Psychiatric nurse education and referral  Loren Racer, LCSW, OSW-C Clinical Social Worker [place]  [place] Phone: [phone] Fax: [phone]

## 2016-07-08 ENCOUNTER — Ambulatory Visit (HOSPITAL_BASED_OUTPATIENT_CLINIC_OR_DEPARTMENT_OTHER): Payer: Medicare Other | Admitting: Hematology and Oncology

## 2016-07-08 ENCOUNTER — Encounter: Payer: Self-pay | Admitting: Hematology and Oncology

## 2016-07-08 DIAGNOSIS — Z79811 Long term (current) use of aromatase inhibitors: Secondary | ICD-10-CM | POA: Diagnosis not present

## 2016-07-08 DIAGNOSIS — K2970 Gastritis, unspecified, without bleeding: Secondary | ICD-10-CM

## 2016-07-08 DIAGNOSIS — F329 Major depressive disorder, single episode, unspecified: Secondary | ICD-10-CM | POA: Diagnosis not present

## 2016-07-08 DIAGNOSIS — C50211 Malignant neoplasm of upper-inner quadrant of right female breast: Secondary | ICD-10-CM | POA: Diagnosis present

## 2016-07-08 DIAGNOSIS — Z170 Estrogen receptor positive status [ER+]: Secondary | ICD-10-CM | POA: Diagnosis not present

## 2016-07-08 NOTE — Progress Notes (Signed)
Patient Care Team: [name], MD as PCP - General (Family Medicine)  DIAGNOSIS:  Encounter Diagnosis  Name Primary?  . Malignant neoplasm of upper-inner quadrant of right breast in female, estrogen receptor positive ([place])     SUMMARY OF ONCOLOGIC HISTORY:   Breast cancer of upper-inner quadrant of right female breast ([place])   06/20/2014 Initial Diagnosis    right breast 3:00: Invasive ductal carcinoma with DCIS, grade 2,ER 90%, PR 1%,HER-2 positive ratio 2.97      06/30/2014 Breast MRI    Right breast 2.4 x 2.2 x 2.2 cm irregular mass, no abnormal lymph nodes      07/14/2014 - 10/28/2014 Neo-Adjuvant Chemotherapy    TCH Perjeta every 3 weeks 6 followed by Herceptin maintenance      11/06/2014 Breast MRI    Right breast mass has significantly decreased in size and is less masslike, maximal enhancement is 1.5 cm previously 2.4 cm      12/02/2014 Surgery    Right lumpectomy: Residual IDC grade 3; 2.4 cm with DCIS high-grade, posterior margin probably positive for invasive cancer, 1/4 lymph nodes positive with extracapsular extension, T2 N1 stage IIB      12/29/2014 - 01/26/2015 Radiation Therapy    Adjuvant radiation therapy      02/24/2015 -  Anti-estrogen oral therapy    Anastrozole 1 mg daily stopped due to hot flashes, emotional disturbances, decreased energy and decreased appetite stopped 05/19/15. Restarted 05/22/16      CHIEF COMPLIANT: Follow-up of breast cancer  INTERVAL HISTORY: Marilyn Dalton is a 76 year old with above-mentioned history of right breast cancer treated with neoadjuvant chemotherapy followed by lumpectomy and radiation. She could not tolerate antiestrogen therapy with anastrozole because of emotional disturbances along with decreased energy and appetite. She started back on anastrozole 6 weeks ago and appears to be tolerating it much better. Energy levels have improved and emotionally she is doing much better as well. However patient feels that she  has not achieved her maximal improvement that she had prior to breast cancer diagnosis and is tired of people telling her what to do to increase her energy levels. She is going to see a therapist as well.  REVIEW OF SYSTEMS:   Constitutional: Denies fevers, chills or abnormal weight loss Eyes: Denies blurriness of vision Ears, nose, mouth, throat, and face: Denies mucositis or sore throat Respiratory: Denies cough, dyspnea or wheezes Cardiovascular: Denies palpitation, chest discomfort Gastrointestinal:  Denies nausea, heartburn or change in bowel habits Skin: Denies abnormal skin rashes Lymphatics: Denies new lymphadenopathy or easy bruising Neurological:Denies numbness, tingling or new weaknesses Behavioral/Psych: Depressed Extremities: Possible Raynaud's syndrome Breast:  denies any pain or lumps or nodules in either breasts All other systems were reviewed with the patient and are negative.  I have reviewed the past medical history, past surgical history, social history and family history with the patient and they are unchanged from previous note.  ALLERGIES:  has No Known Allergies.  MEDICATIONS:  Current Outpatient Prescriptions  Medication Sig Dispense Refill  . anastrozole (ARIMIDEX) 1 MG tablet TAKE 1 TABLET(1 MG) BY MOUTH DAILY 90 tablet 0  . b complex vitamins tablet Take 1 tablet by mouth daily.    . Cholecalciferol (D3 ADULT PO) Take by mouth daily.    . diphenhydrAMINE (BENADRYL) 25 MG tablet Take 25 mg by mouth 2 (two) times daily.    . Fish Oil OIL 360 mg by Does not apply route daily.    [name] letrozole [facility]) 2.5  MG tablet Take 1 tablet (2.5 mg total) by mouth daily. 30 tablet 3  . lidocaine-prilocaine (EMLA) cream Apply to affected area once 30 g 3  . Magnesium 250 MG TABS Take by mouth.    [name] PNEUMOVAX 23 25 MCG/0.5ML injection ADM 0.5ML IM UTD  0  . Potassium 99 MG TABS Take 595 mg by mouth daily.    [name] senna (SENOKOT) 8.6 MG TABS tablet Take 1 tablet by mouth.    [name]  UNABLE TO FIND Med Name: tumeric 524m    . venlafaxine XR (EFFEXOR-XR) 75 MG 24 hr capsule TAKE 1 CAPSULE(75 MG) BY MOUTH DAILY 30 capsule 0  . vitamin E 400 UNIT capsule Take 400 Units by mouth daily.     No current facility-administered medications for this visit.     PHYSICAL EXAMINATION: ECOG PERFORMANCE STATUS: 1 - Symptomatic but completely ambulatory  Vitals:   07/08/16 1022  BP: (!) 157/87  Pulse: 88  Resp: 19  Temp: 97.7 F (36.5 C)   Filed Weights   07/08/16 1022  Weight: 108 lb 4.8 oz (49.1 kg)    GENERAL:alert, no distress and comfortable SKIN: skin color, texture, turgor are normal, no rashes or significant lesions EYES: normal, Conjunctiva are pink and non-injected, sclera clear OROPHARYNX:no exudate, no erythema and lips, buccal mucosa, and tongue normal  NECK: supple, thyroid normal size, non-tender, without nodularity LYMPH:  no palpable lymphadenopathy in the cervical, axillary or inguinal LUNGS: clear to auscultation and percussion with normal breathing effort HEART: regular rate & rhythm and no murmurs and no lower extremity edema ABDOMEN:abdomen soft, non-tender and normal bowel sounds MUSCULOSKELETAL:no cyanosis of digits and no clubbing  NEURO: alert & oriented x 3 with fluent speech, no focal motor/sensory deficits EXTREMITIES:Purple discoloration of hands and feet BREAST: No palpable masses or nodules in either right or left breasts. No palpable axillary supraclavicular or infraclavicular adenopathy no breast tenderness or nipple discharge. (exam performed in the presence of a chaperone)  LABORATORY DATA:  I have reviewed the data as listed   Chemistry      Component Value Date/Time   NA 139 07/07/2015 0813   K 5.0 07/07/2015 0813   CL 102 07/18/2014 0151   CO2 26 07/07/2015 0813   BUN 9.1 07/07/2015 0813   CREATININE 0.8 07/07/2015 0813      Component Value Date/Time   CALCIUM 9.9 07/07/2015 0813   ALKPHOS 64 07/07/2015 0813   AST 32  07/07/2015 0813   ALT 22 07/07/2015 0813   BILITOT 0.42 07/07/2015 0813       Lab Results  Component Value Date   WBC 6.1 07/07/2015   HGB 14.8 07/07/2015   HCT 43.4 07/07/2015   MCV 99.5 07/07/2015   PLT 258 07/07/2015   NEUTROABS 3.9 07/07/2015    ASSESSMENT & PLAN:  Breast cancer of upper-inner quadrant of right female 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, 2.4 x 2.2 x 2.2 cm mass which abuts the pectoral muscle, grade 2, invasive ductal carcinoma, ER 90%, PR 1%, Ki-67 20%, HER-2 positive ratio 2.97, T2 N0 M0 stage II A clinical stage Status post TCH Perjeta 6 cycles started 07/14/2014 completed 10/28/2014 Right lumpectomy 12/02/2014: Residual IDC grade 3; 2.4 cm with DCIS high-grade, posterior margin probably positive for invasive cancer, 1/4 lymph nodes positive with extracapsular extension, T2 N1 stage IIB Adj XRT completed 01/26/15 Herceptin maintenancecompleted May 2017  Current treatment:  Adjuvant antiestrogen therapy with anastrozole started January 2017 stopped  March 2017 restarted 05/22/2016  Echocardiogram 05/20/2015: EF 60-65%  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 On proton pump inhibitor therapy Major depression: Effexor XR 75 mg. she is doing much better and is going to see a therapist Bone density 07/15/2015: Normal I gave her lots of encouragement and gave her positive reinforcement that her efforts to exercise regularly will lead to improvement in energy.  Return to clinic in 6 months for follow-up.  I spent 25 minutes talking to the patient of which more than half was spent in counseling and coordination of care.  No orders of the defined types were placed in this encounter.  The patient has a good understanding of the overall plan. she agrees with it. she will call with any problems that may develop before the next visit here.   Rulon Eisenmenger, MD 07/08/16

## 2016-07-12 ENCOUNTER — Other Ambulatory Visit: Payer: Self-pay | Admitting: Hematology and Oncology

## 2016-07-12 DIAGNOSIS — C50211 Malignant neoplasm of upper-inner quadrant of right female breast: Secondary | ICD-10-CM

## 2016-07-12 DIAGNOSIS — Z170 Estrogen receptor positive status [ER+]: Secondary | ICD-10-CM

## 2016-07-12 DIAGNOSIS — F332 Major depressive disorder, recurrent severe without psychotic features: Secondary | ICD-10-CM

## 2016-07-13 NOTE — Progress Notes (Signed)
[facility] Counseling Note  Counselor attempted to call patient, based on referral for individual counseling per [employer]. However, there was an error at the patient's answering machine and it would not let counselor leave a message. To circumvent this, counselor sent patient an email at the address provided in the chart, letting patient know of individual counseling services.   Westly Pam, [place] LPCA [place]

## 2016-07-19 NOTE — Progress Notes (Signed)
[facility] Counseling Note  Counselor received voicemail from patient, stating that while she had previously been interested in seeing a new counselor, she has since decided to continue with her couple's counselor and was no longer interested in individual counseling. Pt thanked counselor for the email and for the Support Center's attention to her needs.  Westly Pam, [place] LPCA [place]

## 2016-09-29 ENCOUNTER — Other Ambulatory Visit: Payer: Self-pay | Admitting: Hematology and Oncology

## 2016-09-29 DIAGNOSIS — Z170 Estrogen receptor positive status [ER+]: Principal | ICD-10-CM

## 2016-09-29 DIAGNOSIS — C50211 Malignant neoplasm of upper-inner quadrant of right female breast: Secondary | ICD-10-CM

## 2016-10-28 ENCOUNTER — Other Ambulatory Visit: Payer: Self-pay | Admitting: Hematology and Oncology

## 2016-10-28 DIAGNOSIS — Z170 Estrogen receptor positive status [ER+]: Principal | ICD-10-CM

## 2016-10-28 DIAGNOSIS — C50211 Malignant neoplasm of upper-inner quadrant of right female breast: Secondary | ICD-10-CM

## 2016-11-05 IMAGING — CT CT ABD-PELV W/ CM
2 of 5 series · 17 of 46 positions shown, 19 images · IV contrast (OMNIPAQUE)
Comparison: 07/18/2014

CLINICAL DATA: Nausea for 4-6 months since chemotherapy.
Right-sided breast cancer restaging. Diagnosed in [place]. Right
lumpectomy. Hysterectomy. Appendectomy.

EXAM:
CT ABDOMEN AND PELVIS WITH CONTRAST
TECHNIQUE: Multidetector CT imaging of the abdomen and pelvis was performed
using the standard protocol following bolus administration of
intravenous contrast.
CONTRAST:  100mL OMNIPAQUE IOHEXOL 300 MG/ML  SOLN

[Series 2: rtn a/p with · axial · 0.72mm/px · z∈[+823,+1183]mm · 14 of 82 slices shown, 16 images]
[im 5/82  soft-tissue]
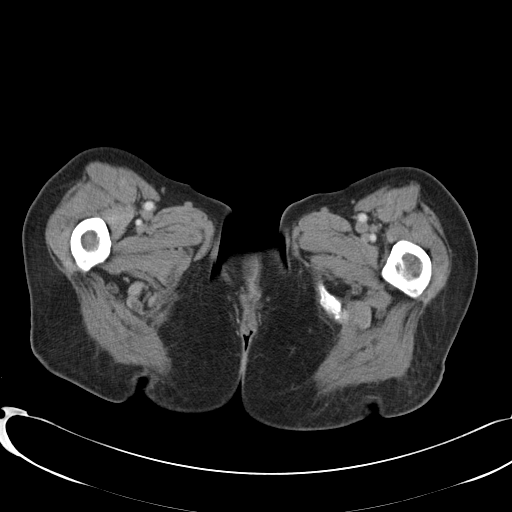
[im 5/82  bone]
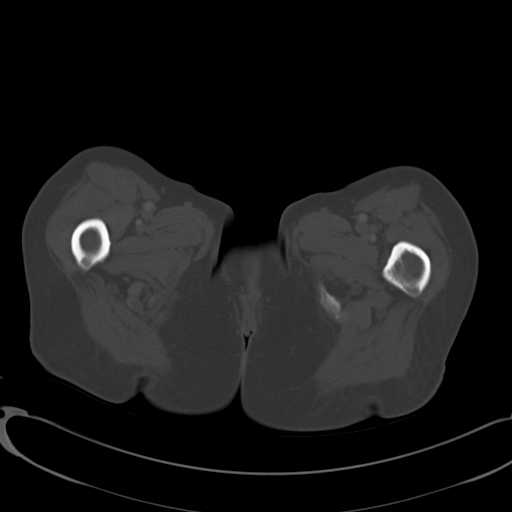
[im 9/82  soft-tissue]
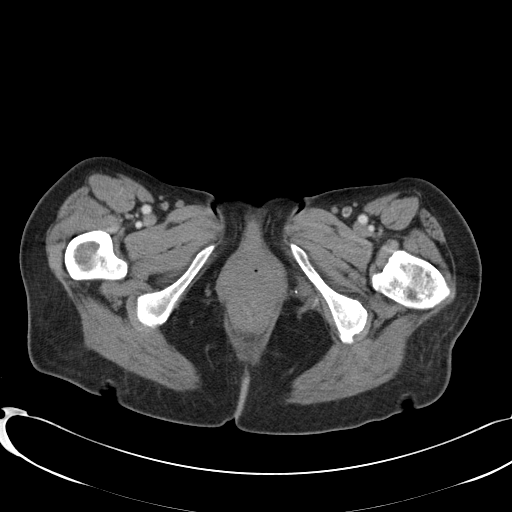
[im 18/82  soft-tissue]
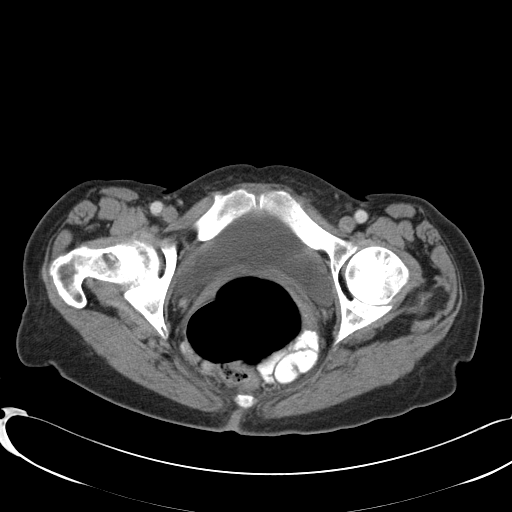
[im 22/82  soft-tissue]
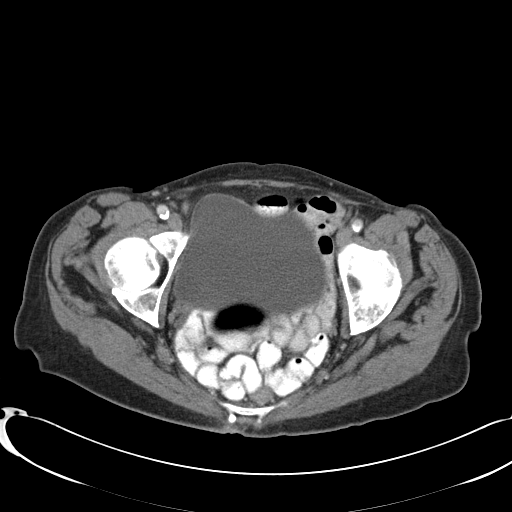
[im 26/82  soft-tissue]
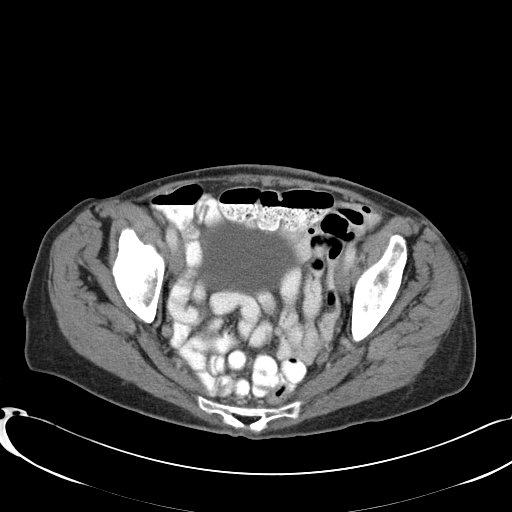
[im 35/82  soft-tissue]
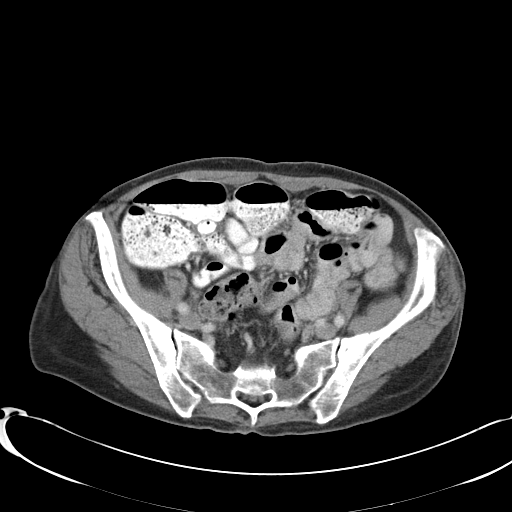
[im 39/82  soft-tissue]
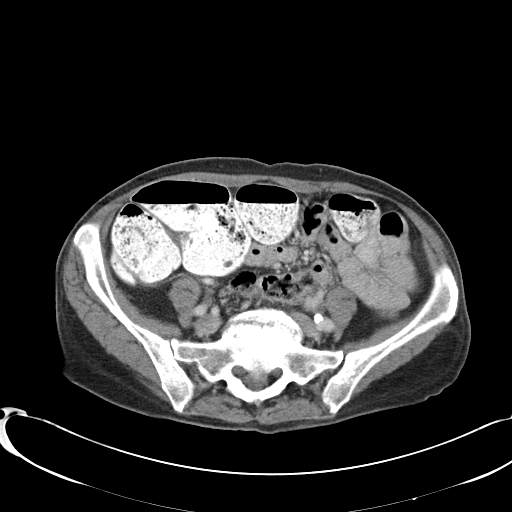
[im 43/82  soft-tissue]
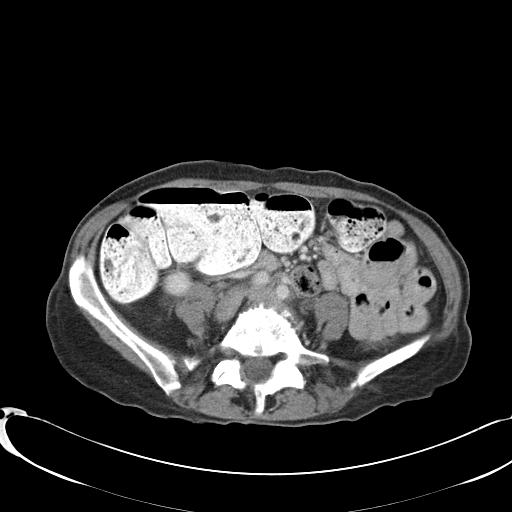
[im 47/82  soft-tissue]
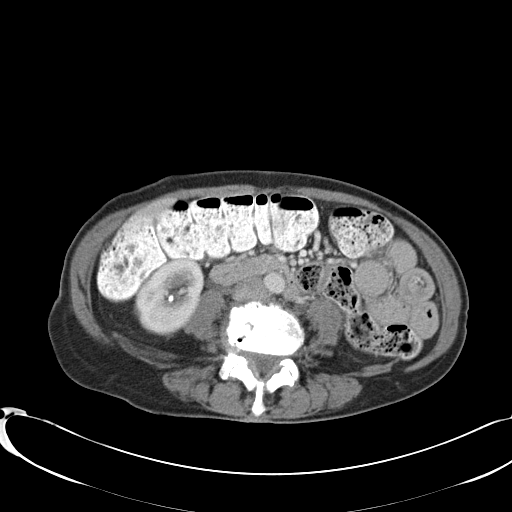
[im 47/82  bone]
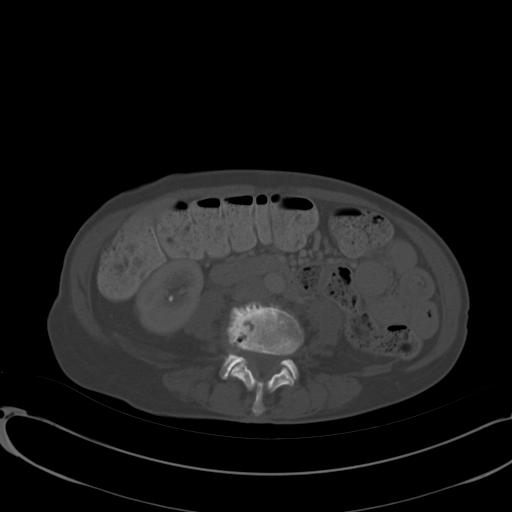
[im 56/82  soft-tissue]
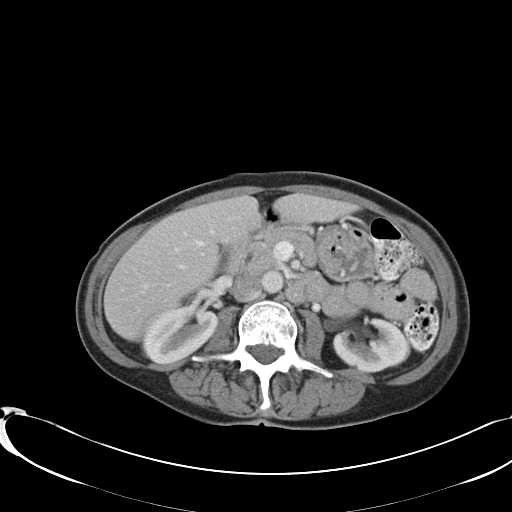
[im 60/82  soft-tissue]
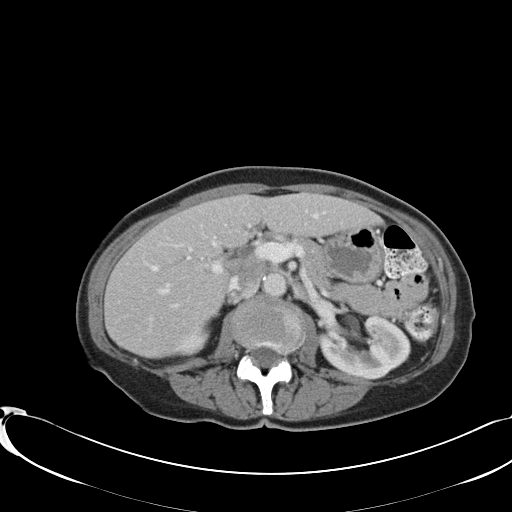
[im 64/82  soft-tissue]
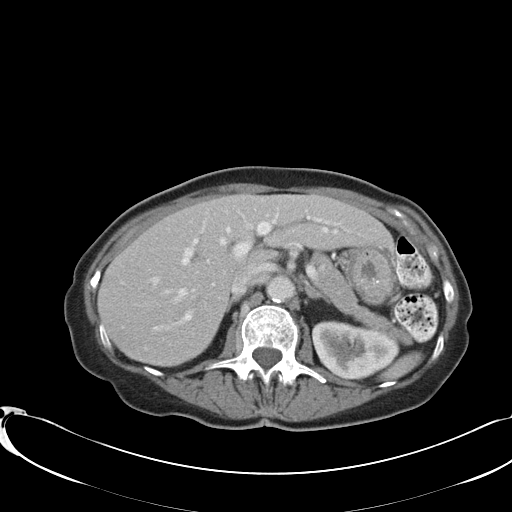
[im 73/82  soft-tissue]
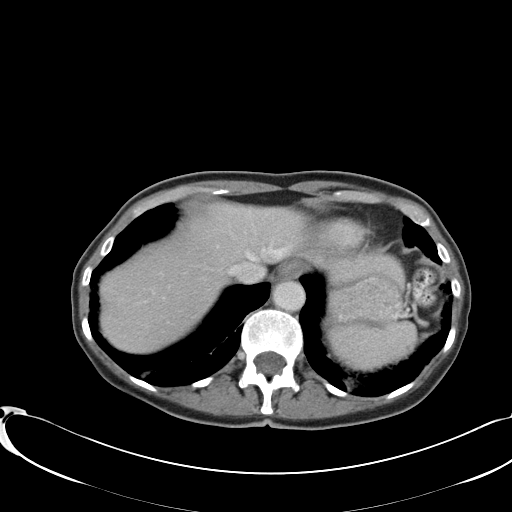
[im 77/82  soft-tissue]
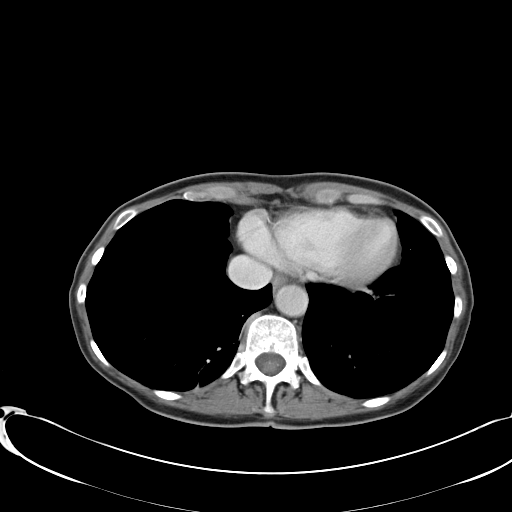

[Series 602: <mpr thick range> · coronal · 0.80mm/px · 3 of 65 slices shown]
[im 22/65  soft-tissue]
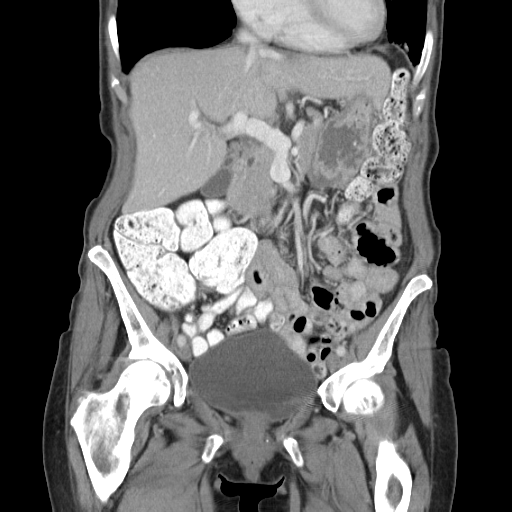
[im 29/65  soft-tissue]
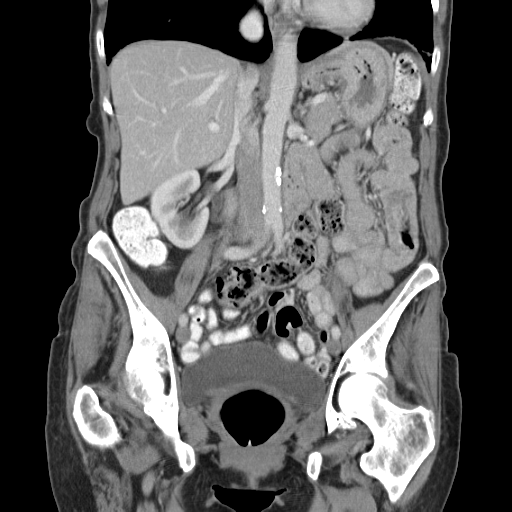
[im 36/65  soft-tissue]
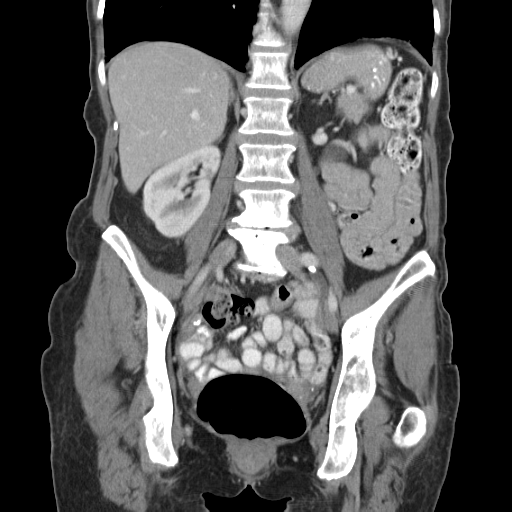

[17 of 46 positions shown; findings below may reference images not displayed]

FINDINGS: Lower chest: Bibasilar scarring. Heart mildly enlarged, accentuated
by a pectus excavatum deformity.

Hepatobiliary: Medial segment left liver lobe sub cm cyst. Normal
gallbladder, without biliary ductal dilatation.

Pancreas: Normal, without mass or ductal dilatation.

Spleen: Normal in size, without focal abnormality.

Adrenals/Urinary Tract: Normal adrenal glands. Lower pole right
renal collecting system 4 mm calculus. Too small to characterize
left renal lesion which is likely a cyst. No hydronephrosis. Normal
urinary bladder.

Stomach/Bowel: Normal stomach, without wall thickening. Normal colon
and terminal ileum. Normal small bowel.

Vascular/Lymphatic: Aortic and branch vessel atherosclerosis. No
abdominopelvic adenopathy.

Reproductive: Hysterectomy.  No adnexal mass.

Other: No significant free fluid. No evidence of omental or
peritoneal disease.

Musculoskeletal: Degenerate disc disease at L4-5 and L5-S1. Large
disc bulge at L4-5.
IMPRESSION: 1.  No acute process in the abdomen or pelvis.
2. No evidence of metastatic disease.
3. Right nephrolithiasis.

## 2016-11-10 ENCOUNTER — Other Ambulatory Visit: Payer: Self-pay | Admitting: Hematology and Oncology

## 2016-11-10 DIAGNOSIS — Z170 Estrogen receptor positive status [ER+]: Secondary | ICD-10-CM

## 2016-11-10 DIAGNOSIS — F332 Major depressive disorder, recurrent severe without psychotic features: Secondary | ICD-10-CM

## 2016-11-10 DIAGNOSIS — C50211 Malignant neoplasm of upper-inner quadrant of right female breast: Secondary | ICD-10-CM

## 2016-11-27 ENCOUNTER — Other Ambulatory Visit: Payer: Self-pay | Admitting: Hematology and Oncology

## 2016-11-27 DIAGNOSIS — Z170 Estrogen receptor positive status [ER+]: Principal | ICD-10-CM

## 2016-11-27 DIAGNOSIS — C50211 Malignant neoplasm of upper-inner quadrant of right female breast: Secondary | ICD-10-CM

## 2016-12-12 ENCOUNTER — Other Ambulatory Visit: Payer: Self-pay | Admitting: Hematology and Oncology

## 2016-12-12 DIAGNOSIS — F332 Major depressive disorder, recurrent severe without psychotic features: Secondary | ICD-10-CM

## 2016-12-12 DIAGNOSIS — Z170 Estrogen receptor positive status [ER+]: Secondary | ICD-10-CM

## 2016-12-12 DIAGNOSIS — C50211 Malignant neoplasm of upper-inner quadrant of right female breast: Secondary | ICD-10-CM

## 2016-12-29 ENCOUNTER — Other Ambulatory Visit: Payer: Self-pay | Admitting: Hematology and Oncology

## 2016-12-29 DIAGNOSIS — Z170 Estrogen receptor positive status [ER+]: Principal | ICD-10-CM

## 2016-12-29 DIAGNOSIS — C50211 Malignant neoplasm of upper-inner quadrant of right female breast: Secondary | ICD-10-CM

## 2016-12-30 ENCOUNTER — Other Ambulatory Visit: Payer: Self-pay

## 2016-12-30 DIAGNOSIS — C50211 Malignant neoplasm of upper-inner quadrant of right female breast: Secondary | ICD-10-CM

## 2016-12-30 DIAGNOSIS — Z170 Estrogen receptor positive status [ER+]: Principal | ICD-10-CM

## 2016-12-30 MED ORDER — LETROZOLE 2.5 MG PO TABS: ORAL_TABLET | ORAL | 0 refills | Status: DC

## 2017-01-09 ENCOUNTER — Ambulatory Visit (HOSPITAL_BASED_OUTPATIENT_CLINIC_OR_DEPARTMENT_OTHER): Payer: Medicare Other | Admitting: Hematology and Oncology

## 2017-01-09 ENCOUNTER — Telehealth: Payer: Self-pay | Admitting: Hematology and Oncology

## 2017-01-09 DIAGNOSIS — Z79811 Long term (current) use of aromatase inhibitors: Secondary | ICD-10-CM | POA: Diagnosis not present

## 2017-01-09 DIAGNOSIS — Z170 Estrogen receptor positive status [ER+]: Secondary | ICD-10-CM

## 2017-01-09 DIAGNOSIS — I10 Essential (primary) hypertension: Secondary | ICD-10-CM | POA: Diagnosis not present

## 2017-01-09 DIAGNOSIS — F329 Major depressive disorder, single episode, unspecified: Secondary | ICD-10-CM | POA: Diagnosis not present

## 2017-01-09 DIAGNOSIS — K2970 Gastritis, unspecified, without bleeding: Secondary | ICD-10-CM

## 2017-01-09 DIAGNOSIS — C50211 Malignant neoplasm of upper-inner quadrant of right female breast: Secondary | ICD-10-CM

## 2017-01-09 DIAGNOSIS — F332 Major depressive disorder, recurrent severe without psychotic features: Secondary | ICD-10-CM

## 2017-01-09 MED ORDER — LETROZOLE 2.5 MG PO TABS: ORAL_TABLET | ORAL | 3 refills | Status: DC

## 2017-01-09 MED ORDER — VENLAFAXINE HCL ER 75 MG PO CP24: ORAL_CAPSULE | ORAL | 3 refills | Status: DC

## 2017-01-09 NOTE — Telephone Encounter (Signed)
Gave patient avs report and appointments for November 2019 °

## 2017-01-09 NOTE — Progress Notes (Signed)
Patient Care Team: [name], MD as PCP - General (Family Medicine)  DIAGNOSIS:  Encounter Diagnoses  Name Primary?  . Malignant neoplasm of upper-inner quadrant of right breast in female, estrogen receptor positive ([place])   . Severe episode of recurrent major depressive disorder, without psychotic features ([place])     SUMMARY OF ONCOLOGIC HISTORY:   Breast cancer of upper-inner quadrant of right female breast ([place])   06/20/2014 Initial Diagnosis    right breast 3:00: Invasive ductal carcinoma with DCIS, grade 2,ER 90%, PR 1%,HER-2 positive ratio 2.97      06/30/2014 Breast MRI    Right breast 2.4 x 2.2 x 2.2 cm irregular mass, no abnormal lymph nodes      07/14/2014 - 10/28/2014 Neo-Adjuvant Chemotherapy    TCH Perjeta every 3 weeks 6 followed by Herceptin maintenance      11/06/2014 Breast MRI    Right breast mass has significantly decreased in size and is less masslike, maximal enhancement is 1.5 cm previously 2.4 cm      12/02/2014 Surgery    Right lumpectomy: Residual IDC grade 3; 2.4 cm with DCIS high-grade, posterior margin probably positive for invasive cancer, 1/4 lymph nodes positive with extracapsular extension, T2 N1 stage IIB      12/29/2014 - 01/26/2015 Radiation Therapy    Adjuvant radiation therapy      02/24/2015 -  Anti-estrogen oral therapy    Anastrozole 1 mg daily stopped due to hot flashes, emotional disturbances, decreased energy and decreased appetite stopped 05/19/15. Restarted 05/22/16       CHIEF COMPLIANT: Follow-up on letrozole therapy  INTERVAL HISTORY: NORENA BRATTON is a 76 year old with above-mentioned history of right breast cancer treated with neoadjuvant chemotherapy with [facility] Perjeta followed by lumpectomy and radiation has resumed antiestrogen therapy with letrozole on 05/22/2016.  She appears to be tolerating letrozole reasonably well.  She does have hot flashes in the morning.  Her depression has improved remarkably.  She is able to  function well and is planning on her first vacation to [place] coming up in the next month.  She was noted to have very high blood pressure.  REVIEW OF SYSTEMS:   Constitutional: Denies fevers, chills or abnormal weight loss Eyes: Denies blurriness of vision Ears, nose, mouth, throat, and face: Denies mucositis or sore throat Respiratory: Denies cough, dyspnea or wheezes Cardiovascular: Denies palpitation, chest discomfort Gastrointestinal:  Denies nausea, heartburn or change in bowel habits Skin: Denies abnormal skin rashes Lymphatics: Denies new lymphadenopathy or easy bruising Neurological:Denies numbness, tingling or new weaknesses Behavioral/Psych: Improved depression Extremities: No lower extremity edema Breast:  denies any pain or lumps or nodules in either breasts All other systems were reviewed with the patient and are negative.  I have reviewed the past medical history, past surgical history, social history and family history with the patient and they are unchanged from previous note.  ALLERGIES:  has No Known Allergies.  MEDICATIONS:  Current Outpatient Medications  Medication Sig Dispense Refill  . b complex vitamins tablet Take 1 tablet by mouth daily.    . Cholecalciferol (D3 ADULT PO) Take by mouth daily.    . diphenhydrAMINE (BENADRYL) 25 MG tablet Take 25 mg by mouth 2 (two) times daily.    [name] letrozole (FEMARA) 2.5 MG tablet TAKE 1 TABLET(2.5 MG) BY MOUTH DAILY 90 tablet 3  . Magnesium 250 MG TABS Take by mouth.    . Potassium 99 MG TABS Take 595 mg by mouth daily.    [name]  senna (SENOKOT) 8.6 MG TABS tablet Take 1 tablet by mouth.    [name] UNABLE TO FIND Med Name: tumeric 570m    . venlafaxine XR (EFFEXOR-XR) 75 MG 24 hr capsule TAKE 1 CAPSULE(75 MG) BY MOUTH DAILY 90 capsule 3  . vitamin E 400 UNIT capsule Take 400 Units by mouth daily.     No current facility-administered medications for this visit.     PHYSICAL EXAMINATION: ECOG PERFORMANCE STATUS: 1 - Symptomatic  but completely ambulatory  Vitals:   01/09/17 1012  BP: (!) 188/99  Pulse: 93  Resp: 17  Temp: 97.7 F (36.5 C)  SpO2: (!) 87%   Filed Weights   01/09/17 1012  Weight: 107 lb 1.6 oz (48.6 kg)    GENERAL:alert, no distress and comfortable SKIN: skin color, texture, turgor are normal, no rashes or significant lesions EYES: normal, Conjunctiva are pink and non-injected, sclera clear OROPHARYNX:no exudate, no erythema and lips, buccal mucosa, and tongue normal  NECK: supple, thyroid normal size, non-tender, without nodularity LYMPH:  no palpable lymphadenopathy in the cervical, axillary or inguinal LUNGS: clear to auscultation and percussion with normal breathing effort HEART: regular rate & rhythm and no murmurs and no lower extremity edema ABDOMEN:abdomen soft, non-tender and normal bowel sounds MUSCULOSKELETAL:no cyanosis of digits and no clubbing  NEURO: alert & oriented x 3 with fluent speech, no focal motor/sensory deficits EXTREMITIES: No lower extremity edema  LABORATORY DATA:  I have reviewed the data as listed   Chemistry      Component Value Date/Time   NA 139 07/07/2015 0813   K 5.0 07/07/2015 0813   CL 102 07/18/2014 0151   CO2 26 07/07/2015 0813   BUN 9.1 07/07/2015 0813   CREATININE 0.8 07/07/2015 0813      Component Value Date/Time   CALCIUM 9.9 07/07/2015 0813   ALKPHOS 64 07/07/2015 0813   AST 32 07/07/2015 0813   ALT 22 07/07/2015 0813   BILITOT 0.42 07/07/2015 0813       Lab Results  Component Value Date   WBC 6.1 07/07/2015   HGB 14.8 07/07/2015   HCT 43.4 07/07/2015   MCV 99.5 07/07/2015   PLT 258 07/07/2015   NEUTROABS 3.9 07/07/2015    ASSESSMENT & PLAN:  Breast cancer of upper-inner quadrant of right female 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, 2.4 x 2.2 x 2.2 cm mass which abuts the pectoral muscle, grade 2, invasive ductal carcinoma, ER 90%, PR 1%, Ki-67 20%, HER-2 positive ratio 2.97, T2 N0  M0 stage II A clinical stage Status post TCH Perjeta 6 cycles started 07/14/2014 completed 10/28/2014 Right lumpectomy 12/02/2014: Residual IDC grade 3; 2.4 cm with DCIS high-grade, posterior margin probably positive for invasive cancer, 1/4 lymph nodes positive with extracapsular extension, T2 N1 stage IIB Adj XRT completed 01/26/15  Current treatment: 1. Adjuvant antiestrogen therapy with anastrozole started January 2017 stopped March 2017 letrozole resumed 05/22/2017 2. 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 On proton pump inhibitor therapy.  Patient is taking herbal medication for this Major depression: Well controlled on Effexor 75 mg daily Severe hypertension: I instructed her to make an appointment and see her primary care physician. Breast cancer surveillance: Annual mammograms 04/14/2017: Benign breast density category D Bone density 07/15/2015: Normal  Return to clinic in 1 year for follow-up   I spent 25 minutes talking to the patient of which  more than half was spent in counseling and coordination of care.  No orders of the defined types were placed in this encounter.  The patient has a good understanding of the overall plan. she agrees with it. she will call with any problems that may develop before the next visit here.   Rulon Eisenmenger, MD 01/09/17

## 2017-01-09 NOTE — Assessment & Plan Note (Signed)
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 Adjuvant antiestrogen therapy with anastrozole started January 2017 stopped March 2017 2. 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 On proton pump inhibitor therapy Major depression: Effexor XR are 37.5 mg daily. Her depression has not improved. I recommended increasing the dosage to 75 mg. If this does not improve then she might need to see psychiatry. The patient is very reluctant to consider seeing a psychiatrist. Severe right hip pain: X-ray showed arthritis. I will refer her to orthopedic specialists to consider joint injections.  Bone density 07/15/2015: Normal  Return to clinic in 3 months for follow-up. This time she was discontinued anastrozole but she would be willing to consider tamoxifen if her general symptoms improve.

## 2017-05-11 ENCOUNTER — Encounter: Payer: Self-pay | Admitting: General Practice

## 2017-05-11 NOTE — Progress Notes (Signed)
[place] CSW Progress Notes  Appointment made for Tuesday March 26 at [place] for assessment and recommendations for treatment.  CSW has also requested medications management appointment w same clinic on patient behalf.  Patient advised by phone call of this.  Provider to mail information packet to patient home address.    [name], LCSW Clinical Social Worker Phone:  [phone]

## 2017-05-11 NOTE — Progress Notes (Signed)
[place] CSW Progress Notes  Referral received from support group facilitator and [place].  CSW asked to reach out to patient to assess needs related to depression and substance use.  Spoke w patient by phone.  Although she and husband have seen psychologist in community for couples work, she does not want to continue w this provider and would like referral for mental health and substance use evaluation w provider in [place].  CSW working on referral to [facility]  Awaiting appointment.  [name], LCSW Clinical Social Worker Phone:  [phone]

## 2017-05-13 ENCOUNTER — Telehealth (HOSPITAL_COMMUNITY): Payer: Self-pay | Admitting: Psychology

## 2017-05-16 ENCOUNTER — Ambulatory Visit (INDEPENDENT_AMBULATORY_CARE_PROVIDER_SITE_OTHER): Payer: Medicare Other | Admitting: Psychology

## 2017-05-16 DIAGNOSIS — F1020 Alcohol dependence, uncomplicated: Secondary | ICD-10-CM

## 2017-05-19 ENCOUNTER — Telehealth (HOSPITAL_COMMUNITY): Payer: Self-pay | Admitting: Psychology

## 2017-05-21 ENCOUNTER — Telehealth (HOSPITAL_COMMUNITY): Payer: Self-pay | Admitting: Psychology

## 2017-05-23 ENCOUNTER — Encounter (HOSPITAL_COMMUNITY): Payer: Self-pay | Admitting: Psychology

## 2017-05-23 DIAGNOSIS — F1020 Alcohol dependence, uncomplicated: Secondary | ICD-10-CM | POA: Insufficient documentation
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
## 2017-05-24 ENCOUNTER — Telehealth (HOSPITAL_COMMUNITY): Payer: Self-pay | Admitting: Psychology

## 2017-05-26 ENCOUNTER — Other Ambulatory Visit: Payer: Self-pay | Admitting: General Surgery

## 2017-05-26 ENCOUNTER — Other Ambulatory Visit: Payer: Self-pay | Admitting: Physician Assistant

## 2017-05-26 DIAGNOSIS — Z9889 Other specified postprocedural states: Secondary | ICD-10-CM

## 2017-05-29 ENCOUNTER — Telehealth (HOSPITAL_COMMUNITY): Payer: Self-pay | Admitting: Psychology

## 2017-05-31 ENCOUNTER — Encounter (HOSPITAL_COMMUNITY): Payer: Self-pay | Admitting: Psychology

## 2017-06-01 ENCOUNTER — Other Ambulatory Visit (HOSPITAL_COMMUNITY): Payer: Medicare Other | Attending: Psychiatry | Admitting: Psychology

## 2017-06-01 DIAGNOSIS — C50211 Malignant neoplasm of upper-inner quadrant of right female breast: Secondary | ICD-10-CM | POA: Insufficient documentation

## 2017-06-01 DIAGNOSIS — F1020 Alcohol dependence, uncomplicated: Secondary | ICD-10-CM

## 2017-06-01 DIAGNOSIS — F3289 Other specified depressive episodes: Secondary | ICD-10-CM | POA: Insufficient documentation

## 2017-06-01 DIAGNOSIS — Z630 Problems in relationship with spouse or partner: Secondary | ICD-10-CM | POA: Insufficient documentation

## 2017-06-01 DIAGNOSIS — K219 Gastro-esophageal reflux disease without esophagitis: Secondary | ICD-10-CM | POA: Insufficient documentation

## 2017-06-01 DIAGNOSIS — F1024 Alcohol dependence with alcohol-induced mood disorder: Secondary | ICD-10-CM | POA: Insufficient documentation

## 2017-06-01 DIAGNOSIS — F17200 Nicotine dependence, unspecified, uncomplicated: Secondary | ICD-10-CM | POA: Insufficient documentation

## 2017-06-01 DIAGNOSIS — J302 Other seasonal allergic rhinitis: Secondary | ICD-10-CM | POA: Insufficient documentation

## 2017-06-01 DIAGNOSIS — Z170 Estrogen receptor positive status [ER+]: Secondary | ICD-10-CM | POA: Insufficient documentation

## 2017-06-01 DIAGNOSIS — Z6372 Alcoholism and drug addiction in family: Secondary | ICD-10-CM | POA: Insufficient documentation

## 2017-06-02 ENCOUNTER — Encounter (HOSPITAL_COMMUNITY): Payer: Self-pay | Admitting: Psychology

## 2017-06-02 NOTE — Progress Notes (Signed)
    Daily Group Progress Note  Program: CD-IOP   06/01/2017 Marilyn Dalton [id number]  Diagnosis: F10.20   Sobriety Date: 05/23/2017  Group Time: 1-2:30pm  Participation Level: Active  Behavioral Response: Appropriate  Type of Therapy: Process Group  Interventions: Supportive  Topic: Patients were active and engaged in process session.  Patients shared about coping skills that they have been using to combat triggers and thoughts of using.  Patients shared about their experiences in 12-step recovery meetings with group members who have yet to attend meetings yet.  Patients drew popsicle sticks with different recovery-related topics written on them and spoke about how they could relate to their chosen topic.  Patients welcomed a new group member.  Counselors collected three UDS.   Group Time: 2:30-4pm  Participation Level: Active  Behavioral Response: Appropriate  Type of Therapy: Psycho-education Group  Interventions: Strength-based and Systems analyst  Topic: Patients were active and engaged in psychoeducation session, during which counselors led education and discussion around [employer] "Five Love Languages".  The Five Languages are five ways that people tend to give/receive love and include words of affirmation, receiving gifts, acts of service, physical touch and quality time.  Patients took an assessment to determine how much they value each love language.  Patients were then encouraged to think about how they prefer to experience love and how their loved ones' love languages may be similar or different from their own.  Patients responded well to psychoeducation and engaged in active discussion.   Summary: Patient was active and engaged in her first session.  Patient presented as open, frank and somewhat tearful as she shared her story of how she came to end up in the [place], which started with finding out her husband was unfaithful in their marriage and then  being told she had breast cancer.  Patient went through a lot of treatment for cancer and became overwhelmed by her own health concerns and the problems of her children, as well as the controlling nature of her husband.  In the popsicle stick activity, patient talked about the concept of "shame" and expressed that it may be difficult to tell some of her friends that she is struggling with alcoholism.  Patient also has only told one of her daughters that she is an alcoholic and seeking treatment.  Patient said, "I don't miss drinking yet, though my husband makes me want to drink".  Patient identified words of affirmation and quality time as her top two love languages.   UDS collected: Yes   AA/NA attended?: No  Sponsor?: No   Brandon Melnick, LCAS 06/02/2017 2:59 PM

## 2017-06-05 ENCOUNTER — Other Ambulatory Visit (HOSPITAL_COMMUNITY): Payer: Self-pay | Admitting: Medical

## 2017-06-05 ENCOUNTER — Other Ambulatory Visit (INDEPENDENT_AMBULATORY_CARE_PROVIDER_SITE_OTHER): Payer: Medicare Other | Admitting: Psychology

## 2017-06-05 ENCOUNTER — Encounter (HOSPITAL_COMMUNITY): Payer: Self-pay | Admitting: Medical

## 2017-06-05 VITALS — BP 140/80 | HR 68 | Ht 65.0 in | Wt 109.0 lb

## 2017-06-05 DIAGNOSIS — Z6372 Alcoholism and drug addiction in family: Secondary | ICD-10-CM

## 2017-06-05 DIAGNOSIS — Z170 Estrogen receptor positive status [ER+]: Secondary | ICD-10-CM | POA: Diagnosis not present

## 2017-06-05 DIAGNOSIS — J302 Other seasonal allergic rhinitis: Secondary | ICD-10-CM

## 2017-06-05 DIAGNOSIS — Z62811 Personal history of psychological abuse in childhood: Secondary | ICD-10-CM

## 2017-06-05 DIAGNOSIS — Z9189 Other specified personal risk factors, not elsewhere classified: Secondary | ICD-10-CM

## 2017-06-05 DIAGNOSIS — C50211 Malignant neoplasm of upper-inner quadrant of right female breast: Secondary | ICD-10-CM

## 2017-06-05 DIAGNOSIS — F1024 Alcohol dependence with alcohol-induced mood disorder: Secondary | ICD-10-CM | POA: Diagnosis not present

## 2017-06-05 DIAGNOSIS — Z630 Problems in relationship with spouse or partner: Secondary | ICD-10-CM | POA: Diagnosis not present

## 2017-06-05 DIAGNOSIS — F1020 Alcohol dependence, uncomplicated: Secondary | ICD-10-CM

## 2017-06-05 DIAGNOSIS — F17200 Nicotine dependence, unspecified, uncomplicated: Secondary | ICD-10-CM

## 2017-06-05 DIAGNOSIS — F10982 Alcohol use, unspecified with alcohol-induced sleep disorder: Secondary | ICD-10-CM

## 2017-06-05 DIAGNOSIS — F3289 Other specified depressive episodes: Secondary | ICD-10-CM

## 2017-06-05 DIAGNOSIS — K219 Gastro-esophageal reflux disease without esophagitis: Secondary | ICD-10-CM | POA: Diagnosis not present
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
## 2017-06-06 ENCOUNTER — Ambulatory Visit
Admission: RE | Admit: 2017-06-06 | Discharge: 2017-06-06 | Disposition: A | Discharge status: Home / Self Care | Payer: Medicare Other | Source: Ambulatory Visit | Attending: General Surgery | Admitting: General Surgery

## 2017-06-06 DIAGNOSIS — Z9889 Other specified postprocedural states: Secondary | ICD-10-CM

## 2017-06-06 HISTORY — DX: Personal history of antineoplastic chemotherapy: Z92.21

## 2017-06-06 HISTORY — DX: Personal history of irradiation: Z92.3
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
## 2017-06-07 ENCOUNTER — Other Ambulatory Visit (HOSPITAL_COMMUNITY): Payer: Medicare Other | Admitting: Psychology

## 2017-06-07 DIAGNOSIS — F1024 Alcohol dependence with alcohol-induced mood disorder: Secondary | ICD-10-CM | POA: Diagnosis not present

## 2017-06-07 DIAGNOSIS — F1020 Alcohol dependence, uncomplicated: Secondary | ICD-10-CM

## 2017-06-08 ENCOUNTER — Other Ambulatory Visit (HOSPITAL_COMMUNITY): Payer: Medicare Other | Admitting: Psychology

## 2017-06-08 DIAGNOSIS — F1020 Alcohol dependence, uncomplicated: Secondary | ICD-10-CM

## 2017-06-08 DIAGNOSIS — F1024 Alcohol dependence with alcohol-induced mood disorder: Secondary | ICD-10-CM | POA: Diagnosis not present

## 2017-06-08 DIAGNOSIS — F3289 Other specified depressive episodes: Secondary | ICD-10-CM

## 2017-06-12 ENCOUNTER — Other Ambulatory Visit (HOSPITAL_COMMUNITY): Payer: Medicare Other | Admitting: Psychology

## 2017-06-12 DIAGNOSIS — F1020 Alcohol dependence, uncomplicated: Secondary | ICD-10-CM

## 2017-06-12 DIAGNOSIS — F1024 Alcohol dependence with alcohol-induced mood disorder: Secondary | ICD-10-CM

## 2017-06-12 DIAGNOSIS — F3289 Other specified depressive episodes: Secondary | ICD-10-CM

## 2017-06-13 ENCOUNTER — Encounter (HOSPITAL_COMMUNITY): Payer: Self-pay | Admitting: Psychology

## 2017-06-13 NOTE — Progress Notes (Signed)
    Daily Group Progress Note  Program: CD-IOP   06/07/2017 Marilyn Dalton [id number]  Diagnosis: F10.20  Sobriety Date: 4/2  Group Time: 1-2:30pm  Participation Level: Active  Behavioral Response: Sharing  Type of Therapy: Process Group  Interventions: Supportive  Topic: Patients were active and engaged in process session.  Patients shared about challenges and successes in recovery and relationships.  Patients provided one another with feedback and shared common experiences.  Patients discussed recovery-based meetings, triggers and ways that they have developed routines to promote their sobriety and recovery.  Medical director met with several patients.   Group Time: 2:30-4pm  Participation Level: Active  Behavioral Response: Appropriate  Type of Therapy: Psycho-education Group  Interventions: Assertiveness Training  Topic: Patients were active and engaged in psychoeducation session, during which counselors continued leading discussion around the topic of communication.  Patients identified ways that they had communicated throughout the week and what type of communication they used (passive, aggressive passive-aggressive, or assertive).  Counselors taught more about assertive communication and how to use the pattern "When you _____, I feel ______" to communicate effectively and take responsibility for one's feelings.  Patients engaged in role plays and discussed current conflicts in their lives, while using the "Feelings Wheel" to name emotions that they were experiencing.  Patients responded well to psychoeducation and engaged in active discussion.   Summary: Patient was active and engaged in session.  Patient expressed interest in attending the [employer] AA meeting in town; counselors provided the information about it.  Patient said she spent the last several days getting things done that she had neglected the last four months, when she was in active addiction.  Patient  said she spoke to her daughters on Skype and they both commented on how good she looked.  Patient attributes looking and feeling better to her sobriety, which she has only told one of her daughters.  Patient shared about attending her cancer support group the night before and how two young women are struggling with bleak cancer prognoses.  Patient said she reflected upon how these women did not choose to have that disease but that she chose to keep drinking.  Counselors affirmed patient's autonomy and reminded patient of the biological and genetic components of alcoholism and addiction.  Patient provided feedback to other group members and shared that she and her husband sometimes take a "time out" when they are in the midst of a heated discussion.   UDS collected: No   AA/NA attended?: No  Sponsor?: No   Brandon Melnick, LCAS 06/13/2017 11:32 AM

## 2017-06-14 ENCOUNTER — Other Ambulatory Visit (HOSPITAL_COMMUNITY): Payer: Medicare Other | Admitting: Psychology

## 2017-06-14 ENCOUNTER — Encounter (HOSPITAL_COMMUNITY): Payer: Self-pay | Admitting: Psychology

## 2017-06-14 DIAGNOSIS — F1024 Alcohol dependence with alcohol-induced mood disorder: Secondary | ICD-10-CM | POA: Diagnosis not present

## 2017-06-14 DIAGNOSIS — F1020 Alcohol dependence, uncomplicated: Secondary | ICD-10-CM

## 2017-06-14 NOTE — Progress Notes (Signed)
    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 Counselors led pt's in discussion concerning their recovery from mind-altering drugs. Emphasis on goals from tx plan and thoughts and feelings processing. 1 Pt graduated successfully and family memers were present for final 15 min of group. UDS results were returned to some members.      Group Time: 2:30-4  Participation Level: Active  Behavioral Response: Appropriate and Sharing  Type of Therapy: Psycho-education Group  Interventions: CBT and Psychosocial Skills: ADL's and Sleep Hygiene  Topic: Patients were active and engaged in group psychoeducation session. Ashok Croon, Wellness Director and health educator spoke to group for 1 hour on activities of daily living, w/ an emphasis on improving sleep. A powerpoint was presented. Pts asked questions and some asked for help on improving their sleep habits.     Summary: Pt was active and engaged in session. She reported attending one women's AA meeting this morning and enjoyed it. Pt continues to check in w/ her number of days sober in addition to her sobriety date. Pt shared that the [place] meetings are helping her realize "she got off easy" w/ her alcoholism. She feels supported by her husband and their communication is increasing. Pt shared that "each day in sobriety feels a little better than the last". Pt was supportive of graduating member and offered her helpful feedback for "asking for help".    UDS collected: No Results: negative  AA/NA attended?:  YesThursday  Sponsor?: No   Marilyn Dalton, LPCA LCASA 06/14/2017 8:37 AM

## 2017-06-15 ENCOUNTER — Other Ambulatory Visit (HOSPITAL_COMMUNITY): Payer: Medicare Other | Admitting: Psychology

## 2017-06-15 DIAGNOSIS — F1024 Alcohol dependence with alcohol-induced mood disorder: Secondary | ICD-10-CM | POA: Diagnosis not present

## 2017-06-15 DIAGNOSIS — F1020 Alcohol dependence, uncomplicated: Secondary | ICD-10-CM

## 2017-06-16 ENCOUNTER — Encounter (HOSPITAL_COMMUNITY): Payer: Self-pay | Admitting: Psychology

## 2017-06-16 NOTE — Progress Notes (Signed)
    Daily Group Progress Note  Program: CD-IOP   06/15/2017 AMRI LIEN [id number]  Diagnosis: F10.20   Sobriety Date: 4/2  Group Time: 1-2:30pm  Participation Level: Active  Behavioral Response: Appropriate  Type of Therapy: Process Group  Interventions: Supportive  Topic: Patients engaged in process group and discussion about challenges and successes that they have had in recovery.  Patients shared reflections on chaplain's visit from the previous day.  Patients shared about coping skills and communication issues and provided feedback to one another.  Patients responded well to process group.   Group Time: 2:30-4pm  Participation Level: Active  Behavioral Response: Appropriate  Type of Therapy: Psycho-education Group  Interventions: Psychosocial Skills: Boundaries  Topic: Patients were active and engaged in psychoeducation group.  Counselors led psychoeducation around types of boundaries: rigid, porous and healthy.  Patients engaged in discussion and identified typical boundaries in their relationships.  Patients learned about health in the areas of financial, sexual, emotional, intellectual, physical, and time boundaries.  Patients identified and discussed areas in life where they could improve their boundaries.  Patients responded well to psychoeducation group.   Summary: Patient was active and engaged in session.  Patient shared that she spoke to her husband about the expectations that they had previously agreed upon.  Patient's husband said that he had forgotten about the parameters and that they will reinstate the policy of him not bothering her before 11 AM and not working on her newspaper puzzle before her.  Counselors and other group members affirmed patient's use of assertive communication skills.  Patient expressed that she believes she has healthy boundaries.   UDS collected: No   AA/NA attended?: No  Sponsor?: No   Brandon Melnick, LCAS 06/16/2017 2:11  PM

## 2017-06-19 ENCOUNTER — Encounter (HOSPITAL_COMMUNITY): Payer: Self-pay | Admitting: Psychology

## 2017-06-19 ENCOUNTER — Other Ambulatory Visit (HOSPITAL_COMMUNITY): Payer: Medicare Other | Admitting: Psychology

## 2017-06-19 DIAGNOSIS — F1024 Alcohol dependence with alcohol-induced mood disorder: Secondary | ICD-10-CM | POA: Diagnosis not present

## 2017-06-19 DIAGNOSIS — F1020 Alcohol dependence, uncomplicated: Secondary | ICD-10-CM
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
## 2017-06-20 ENCOUNTER — Encounter (HOSPITAL_COMMUNITY): Payer: Self-pay | Admitting: Psychology

## 2017-06-20 NOTE — Progress Notes (Signed)
    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 Counselors led pt's in discussion concerning their recovery from mind-altering drugs. Emphasis on goals from tx plan and thoughts and feelings processing. UDS results were collected from some members. Some members met w/ Investment banker, operational.      Group Time: 2:30-4  Participation Level: Active  Behavioral Response: Appropriate and Sharing  Type of Therapy: Psycho-education Group  Interventions: CBT, Assertiveness Training and Psychosocial Skills: Communication  Topic: Patients were active and engaged in group psychoeducation session. Counselors led a lesson about improving communication skills, assertiveness, and facilitated 30 min of role play activities for pts to practice improving their communication.    Summary: Pt was active and engaged in session. She reported attending 3 AA meetings over the weekend. She continued to deny severe cravings, depression, and or anxiety related to her alcoholism. She states she is working to improve her communication w/ her husband. She asked for him not to talk to her about 'significant things' before 11AM and he is responding well to this request. Pt displayed healthy assertiveness communication w/o prompting during her role play. She states that communication is "something she thinks about often" and has spent much time trying to improve. Pt shared that she frequently gets baked goods from neighbors and does not tell them that she "is not eating sweets" and instead chooses to throw  the sweets away.   UDS collected: No Results: Pos for Barbiturates and Naltrexone.  AA/NA attended?: YesFriday and Saturday  Sponsor?: No   Marilyn Dalton, LPCA LCASA 06/20/2017 2:20 PM

## 2017-06-21 ENCOUNTER — Other Ambulatory Visit (HOSPITAL_COMMUNITY): Payer: Medicare Other | Attending: Psychiatry | Admitting: Psychology

## 2017-06-21 ENCOUNTER — Encounter (HOSPITAL_COMMUNITY): Payer: Self-pay | Admitting: Psychology

## 2017-06-21 DIAGNOSIS — F1020 Alcohol dependence, uncomplicated: Secondary | ICD-10-CM

## 2017-06-21 NOTE — Progress Notes (Signed)
    Daily Group Progress Note  Program: CD-IOP   06/21/2017 Marilyn Dalton [id number]  Diagnosis: F10.20 Alcohol Use Disorder  Sobriety Date: 05/23/17  Group Time: 1-2:30pm  Participation Level: Active  Behavioral Response: Sharing  Type of Therapy: Process Group  Interventions: Supportive  Topic: Process: the first half of group was spent in process. Members shared about the past weekend. they identified 'speed bumps' or challenges as well as successes they may have had. A new group member was present today and she introduced herself during this half of group. The medical director met with a member for a med check. Three random drug tests were collected.  Group Time: 2:30-4pm  Participation Level: Active  Behavioral Response: Sharing  Type of Therapy: Psycho-education Group  Interventions: Psychosocial Skills: change your thoughts, attitudes and behaviors  Topic: Psycho-Ed: Bio-Psycho-Social-Spiritual. The second half of group was a psycho-ed on the four major elements that contribute to the development of addiction. Group members were asked to identify examples of ways that their psychological, social and spiritual experiences now, and in childhood, may have contributed to their addiction. They were the asked to identify healthy ways that they can address these three elements that will enhance or promote sobriety. The session was lively with good disclosure and the newest group member seemed very comfortable and well-received.   Summary: The patient reported she had a 'great weekend'. she reported she and her husband had attended a crawdad dinner and it had been fun. "Everyone drank except me", she reported, including her husband. When the counselor pointed out that her husband had said he would stop drinking entirely to support him, she negated the significance of his previous declaration. The counsellor questioned the patient and she resisted back. The patient has displayed a  growing denial or lack of awareness about how ill and desperate she was in active addiction. Its as if the patient has 'amnesia' and has forgotten how very miserable she was. The patient reported she had attended three [facility] meetings and explained, there are a lot of older people in the meetings and "they give me hope". The patient was engaged in the psycho-ed and identified "New Year's Eve" and the ritual of drinking on this special day. She drew the tongue depressor that had the topic of 'forgiveness'. The patient reported this was difficult because she hadn't done anything to anyone while she was drinking so there wasn't anything to apologize about. The counselor wondered if this suggestion signified denial and encouraged the patient to speak with her four adult children about any negative consequences they had felt while she was drinking. The patient agreed she would do this. She was engaged and provided helpful feedback, but her seeming negation of anything bad due to her drinking is frustrating for the group.   UDS collected: Yes Results: pending  AA/NA attended?: [place], Friday and Sunday  Sponsor?: No   Brandon Melnick, LCAS 06/21/2017 9:14 AM

## 2017-06-22 ENCOUNTER — Encounter (HOSPITAL_COMMUNITY): Payer: Self-pay | Admitting: Psychology

## 2017-06-22 ENCOUNTER — Other Ambulatory Visit (HOSPITAL_COMMUNITY): Payer: Medicare Other

## 2017-06-26 ENCOUNTER — Other Ambulatory Visit (HOSPITAL_COMMUNITY): Payer: Medicare Other | Admitting: Psychology

## 2017-06-26 ENCOUNTER — Encounter (HOSPITAL_COMMUNITY): Payer: Self-pay | Admitting: Psychology

## 2017-06-26 DIAGNOSIS — F1020 Alcohol dependence, uncomplicated: Secondary | ICD-10-CM | POA: Diagnosis not present

## 2017-06-26 NOTE — Progress Notes (Signed)
Marilyn Dalton is a 77 y.o. female patient. CD-IOP: The patient was engaged and active in group today. However, she informed the counselor around 3:30 that she had to leave early to meet someone at her house. She had not shared this information prior to the session. It proved to be very disruptive to the group session. Will remind members to share plans to leave group early with counselor before the session begins and to leave quietly and quickly. The patient will not be billed nor credited for this session.        Brandon Melnick, LCAS

## 2017-06-26 NOTE — Progress Notes (Signed)
We regret you did not arrive for your appointment today with Ellison Carwin.  This is your third missed appointment.  Though we realize patients frequently have valid reasons for missing their appointments, the mental health professional has lost time and other patients have been denied an opportunity for treatment.  Please be aware that three late cancelled or no show appointments will result in your being referred to another mental health practice.  If this should occur, you will be provided emergency services for thirty days in order for you to arrange for treatment elsewhere. There is a $50 charge for any no show or late cancelled appointment.    We do appreciate the opportunity to serve you and look forward to hearing from you.  Please feel free to reschedule your missed appointment by calling [phone].  Sincerely,    Ellison Carwin, LCSW    Daily Group Progress Note  Program: CD-IOP   06/26/2017 ELYCIA WOODSIDE [id number]  Diagnosis: Alcohol Use Disorder, Severe  Sobriety Date: 4/2  Group Time: 1-2:30pm  Participation Level: Active  Behavioral Response: Appropriate and Sharing  Type of Therapy: Process Group  Interventions: Supportive  Topic: Patients were active and engaged in process session.  Patients discussed the fifth step, within the context of the other 12 steps and shared thoughts on the weight and significance of this step.  Patients reflected upon the self-centeredness that often accompanies addiction.  The group celebrated a [employer] graduation and welcomed a new member.  Patients gained knowledge that new group member shared about what he has learned in recovery so far. Counselors collected two UDS.  Group Time: 2:30-4pm  Participation Level: Active  Behavioral Response: Sharing  Type of Therapy: Psycho-education Group  Interventions: Being Vulnerable  Topic: Patients were active and engaged in psychoeducation session.  Counselor led a "loving-kindness  meditation" and patients processed their reactions.  Counselors led education and discussion around vulnerability.  Patients watched a Ted Talk by Jerelyn Scott, called "The Power of Vulnerability."  Patients reflected on how being vulnerable and recognizing one's self-worth can enhance quality of life.  Patients responded well to psychoeducation and engaged in active discussion.  Summary: Patient was active and engaged in session.  Patient said she bought the book "Twelve Steps and Twelve Traditions" and found it helpful as she started reading it.  Patient said she laid on her deck for an hour and spent time working in her backyard.  Patient said she felt grateful and happy to be working on her backyard, as she was unable to do so for a couple years when she was dealing with chemo treatments and depression.  Patient connected to the [facility] Talk and expressed that sharing our struggles is the only way that we improve.  UDS collected: No  AA/NA attended?: YesWednesday  Sponsor?: No   Brandon Melnick, LCAS 06/26/2017 9:31 AM

## 2017-06-28 ENCOUNTER — Encounter (HOSPITAL_COMMUNITY): Payer: Self-pay

## 2017-06-28 ENCOUNTER — Other Ambulatory Visit (HOSPITAL_COMMUNITY): Payer: Medicare Other | Admitting: Psychology

## 2017-06-28 DIAGNOSIS — F1020 Alcohol dependence, uncomplicated: Secondary | ICD-10-CM

## 2017-06-28 NOTE — Progress Notes (Signed)
    Daily Group Progress Note  Program: CD-IOP   06/28/2017 Marilyn Dalton [id number]  Diagnosis:  Alcohol use disorder, severe, dependence ([place])   Sobriety Date: 4/2  Group Time: 1-2:30  Participation Level: Active  Behavioral Response: Appropriate and Sharing  Type of Therapy: Process Group  Interventions: CBT  Topic: Patients were active and engaged in process session. Counselors led pt's in discussion concerning their recovery from mind-altering drugs. Emphasis on goals from tx plan and thoughts and feelings processing. UDS results were collected from some members. Some members met w/ Investment banker, operational.      Group Time: 2:30-4  Participation Level: Active  Behavioral Response: Appropriate and Sharing  Type of Therapy: Psycho-education Group  Interventions: Meditation: Chair Yoga, Mindful Eating  Topic: Patients were active and engaged in group psychoeducation session. A guest LPC, [name], led group in a 1hr chair yoga routine for relaxation. Counselors processed ways that pts could implement parts of chair yoga in their own daily routine. Counselor led a 15 min mindful eating exercise using chocolate pieces.    Summary: Pt was active and engaged in session. She reported she attended 3 AA meetings over weekend. UDS results were collected, results pending as of 5/8. Pt reports she is gardening and feeling effects of light exercise. Pt asked the group, "Are we not our own higher power?" to which members had mixed reaction to some positive and some negative. Pt continues to minimize her disclosures, sharing primarily her bxs and 12 step related questions. Pt denies cravings or thoughts of using. Continues to report family support from husband. Was active in yoga and shared openly during mindful eating exercise.   UDS collected: Yes Results: pending as of 5/8  AA/NA attended?: YesFriday and Saturday  Sponsor?: No   [name], LPCA LCASA 06/28/2017 2:21 PM

## 2017-06-29 ENCOUNTER — Encounter (HOSPITAL_COMMUNITY): Payer: Self-pay | Admitting: Psychology

## 2017-06-29 ENCOUNTER — Other Ambulatory Visit (HOSPITAL_COMMUNITY): Payer: Medicare Other | Admitting: Psychology

## 2017-06-29 DIAGNOSIS — F1020 Alcohol dependence, uncomplicated: Secondary | ICD-10-CM | POA: Diagnosis not present

## 2017-06-29 NOTE — Progress Notes (Signed)
    Daily Group Progress Note  Program: CD-IOP   06/29/2017 Marilyn Dalton [id number]  Diagnosis: F10.20 Alcohol Use Disorder, severe  Sobriety Date: 4/2  Group Time: 1-2:30pm  Participation Level: Active  Behavioral Response: Sharing  Type of Therapy: Process Group  Interventions: Supportive  Topic: The first half of group was spent in process. Members shared about issues and concerns in early recovery. Members provided helpful and supportive feedback to each other and examples of the things they are doing differently. A guest was present, in the form of an [employer] PA student. The medical director met with three group members for medication checks. One random drug test was collected.   Group Time: 2:30-4pm  Participation Level: Active  Behavioral Response: Appropriate  Type of Therapy: Psycho-education Group  Interventions: Strength-based  Topic: Psycho-Education: The Neurobiology of Addiction. The second half of group included a slide show on the brain and the chemical nature of addiction. The emphasis was on the primitive brain or limbic system and the release of dopamine in huge amounts because of ingesting alcohol and other drugs. Members asked good questions and discussed their past experienced and any developing cravings around people, places or things. A good discussion followed about how one might generate 'natural dopamine discharge' and examples provided by the members.  Summary: The patient reported she had met with her counselor for her weekly individual session yesterday. It had been a good session. When asked what they had spoken about, the patient reported she had talked at length about her family, especially her children. She shared that she had agreed to allow the counselor to speak with her oldest daughter who lives in [place]. The patient admitted she did not know what her daughter would say, but agreed that it would give the counselor a different  and additional perspective on her mother.  The patient shared about her experiences having cancer and how much her body had changed. She also noted that her body has changed 'due to no alcohol'. "I love being sober", she reported and how much she is enjoying her life. The patient was able to relate to another group member who shared about his spouse having to adapt to his new self in recovery. She also expressed appreciation for her husband and his helping around the house with chores since she is busy with this program and AA meetings. The patient was attentive and shared in the psycho-ed about times she might feel compelled to drink and how her drinking patterns changed and she began drinking in the mornings. The patient made some helpful comments and responded well to this intervention.   UDS collected: No Results:   AA/NA attended?: YesTuesday  Sponsor?: No   Brandon Melnick, [place] 06/29/2017 10:22 AM

## 2017-07-03 ENCOUNTER — Other Ambulatory Visit (HOSPITAL_COMMUNITY): Payer: Medicare Other | Admitting: Psychology

## 2017-07-03 DIAGNOSIS — F1020 Alcohol dependence, uncomplicated: Secondary | ICD-10-CM

## 2017-07-04 ENCOUNTER — Encounter (HOSPITAL_COMMUNITY): Payer: Self-pay | Admitting: Psychology

## 2017-07-04 NOTE — Progress Notes (Signed)
    Daily Group Progress Note  Program: CD-IOP   07/04/2017 Marilyn Dalton [id number]  Diagnosis: F10.20 Alcohol Use Disorder, severe  Sobriety Date: 05/23/17  Group Time: 1-2:30pm  Participation Level: Active  Behavioral Response: Sharing  Type of Therapy: Process Group  Interventions: Supportive  Topic: Process: The first half of group was spent in process. Members shared about any challenges or struggles they had experienced since we last met. A mew group member appeared, and he was asked to introduce himself and share a little about what had brought him here. The group was very validating and welcoming to this newest group member. Two random drug tests were collected.    Group Time: 2:30-4pm  Participation Level: Active  Behavioral Response: Appropriate  Type of Therapy: Psycho-Education  Interventions: Strength-based  Topic: Progress Guided Relaxation/Psycho-Ed; Neurobiology, Part 2. The second half of group began with a 15-minute guided progressive relaxation. Members closed their eyes as the counselor guided them through the exercise. Several members described the exercise as being relaxing and two felt so relaxed they might go to sleep. The psycho-ed began with a brief [employer] clip showing a patient undergoing an MRI and the benefits derived from a medication called 'Baclofen'. It is one that is frequently prescribed by our medical director. A discussion ensued about the emotional memory of the primitive brain. Members described their own experiences in situations where those memories were activated. The discussion was lively with good feedback among group members.  Summary: The patient reported she had gone home yesterday after group and was 'very tired'. She was in bed by 9:30 and had some 'very weird dreams. The patient reported she had attended the [place] meeting this morning. It was a women's only meeting and it was 'very good'. she shared that the topic had been 'trust  and acceptance'. She reported there was a 'little old lady' who picked up her 28-year chip. In the psycho-ed, the patient's word was 'intimacies. She shared that she feels intimate with many people in different ways. Whether it is her husband, neighbors, friends or her children, "we are close in different ways, but all good ways'. The patient shared little in the discussion on describing chemical dependency except to explain that addiction this is a chronic disease and she will be going to meetings for the rest of her life. She responded well to this intervention.    UDS collected: No  AA/NA attended: Yes, This morning  Sponsor?: No   Brandon Melnick, LCAS 07/04/2017 7:10 AM

## 2017-07-05 ENCOUNTER — Other Ambulatory Visit (HOSPITAL_COMMUNITY): Payer: Medicare Other | Admitting: Psychology

## 2017-07-05 DIAGNOSIS — F1020 Alcohol dependence, uncomplicated: Secondary | ICD-10-CM

## 2017-07-06 ENCOUNTER — Other Ambulatory Visit (HOSPITAL_COMMUNITY): Payer: Medicare Other | Admitting: Psychology

## 2017-07-06 DIAGNOSIS — F1020 Alcohol dependence, uncomplicated: Secondary | ICD-10-CM | POA: Diagnosis not present

## 2017-07-06 DIAGNOSIS — F3289 Other specified depressive episodes: Secondary | ICD-10-CM

## 2017-07-06 DIAGNOSIS — F1024 Alcohol dependence with alcohol-induced mood disorder: Secondary | ICD-10-CM

## 2017-07-07 ENCOUNTER — Encounter (HOSPITAL_COMMUNITY): Payer: Self-pay | Admitting: Psychology

## 2017-07-07 NOTE — Progress Notes (Signed)
    Daily Group Progress Note  Program: CD-IOP   07/07/2017 Marilyn Dalton [id number]  Diagnosis:  Alcohol use disorder, severe, dependence ([place])   Sobriety Date: 4/2  Group Time: 1-2:30  Participation Level: Active  Behavioral Response: Appropriate and Sharing  Type of Therapy: Process Group  Interventions: CBT and Supportive  Topic: Patients were active and engaged in process session. Counselors led pt's in discussion concerning their recovery from mind-altering drugs. Counselors emphasized pt's goals from tx plan and thoughts and feelings processing. UDS results were collected from some members. One member met w/ Investment banker, operational to establish care.      Group Time: 2:30-4  Participation Level: Active  Behavioral Response: Appropriate and Sharing  Type of Therapy: Psycho-education Group  Interventions: CBT and Psychosocial Skills: Relapse Prevention  Topic: Patients were active and engaged in group psychoeducation session. Counselors led lesson on Relapse Prevention. Counselors asked pts to make relapse prevention card w/ activities and contact numbers to call in case of high risk situations. Counselors helped pts discuss their specific triggers that would most  likely cause them to use again.    Summary: Pt was active and engaged in group. She reported she attended 2 AA meetings over weekend. She states she is not actively talking in meetings but she is gaining through listening to other women in recovery. She reported she mowed her lawn and felt a strong desire for alcohol upon finishing but quickly distracted herself from the though by having a replacement beverage. She stated she processed her highly emotional response- that occurred in the last group session- over the weekend. Pt appeared to be somewhat embarrassed but admitted she has "never told anyone about how much her daughter's alcoholism had effected her (pt). Pt stated her triggers are anger and fighting  and verbalized distracting herself w/ gardening, breathing, and taking a walk.   UDS collected: No Results: negative  AA/NA attended?: YesFriday and Saturday  Sponsor?: No   Marilyn Dalton, LPCA LCASA 07/07/2017 9:18 AM

## 2017-07-07 NOTE — Progress Notes (Signed)
    Daily Group Progress Note  Program: CD-IOP   07/07/2017 Marilyn Dalton [id number]  Diagnosis: F10.20 Alcohol Use Disorder, Severe  Sobriety Date: 4/2  Group Time: 1-2:30pm  Participation Level: Active  Behavioral Response: Appropriate  Type of Therapy: Process Group  Interventions: Supportive  Topic: Process: the first half of group was spent in process. Members shared about challenges or struggles in early recovery. They also identified positive or affirming things that they had done or experienced since we last met. the medical director met with three group members. a new group member was present, and she introduced herself and shared about her addiction. Three random drug tests were collected.  Group Time: 2:30-4pm  Participation Level: Active  Behavioral Response: Sharing  Type of Therapy: Psycho-education Group  Interventions: CBT  Topic: Psycho-Ed: Chaplain. The psycho-ed included a visit from the chaplain. After introductions, the chaplain led the group in a five-minute guided meditation. Members reported finding this meditation very helpful and relaxing. The chaplain invited members to share briefly about their understanding of spirituality. He ended the session with what he calls, the "[place]' which was an explanation of just how distorted one's feelings can be from reality. It was an introduction to basic CBT principles. Group members were engaged and shared freely with the guest.  Summary: The patient reported she had attended two [place] meetings since we last met. she pointed out she had seen the youngest member at the meeting and had been excited to see her there. she expressed confusion about her emotional display late last week. She wasn't certain why she had become so tearful and described herself as a 'blubbering idiot'. Another member reminded her that it had been very painful to experience the rejection her daughter had when she entered treatment ten  years ago. It was to be expected that she was upset.  In the psycho-ed, the patient informed the chaplain that she had realized that 'there is a God'. He asked how she had come to this conclusion? The patient explained that whenever she found herself in a difficult or scary place, "I pray to God". She reported she had recognized she only reaches out when she is in trouble, but that shows she really believes and asks for help. Other members concurred with this observation. The patient informed the chaplain that she thoroughly enjoyed his visits and that she hopes she will still be here when he returns. She made some helpful comments and responded well to this intervention.     UDS collected: No    AA/NA attended?: [place] and Wednesday  Sponsor?: No   Brandon Melnick, LCAS 07/07/2017 11:04 AM

## 2017-07-10 ENCOUNTER — Other Ambulatory Visit (HOSPITAL_COMMUNITY): Payer: Medicare Other

## 2017-07-12 ENCOUNTER — Other Ambulatory Visit (HOSPITAL_COMMUNITY): Payer: Medicare Other

## 2017-07-12 ENCOUNTER — Encounter (HOSPITAL_COMMUNITY): Payer: Self-pay | Admitting: Psychology

## 2017-07-12 NOTE — Progress Notes (Signed)
    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 Counselors led pt's in discussion concerning their recovery from mind-altering drugs. Counselors emphasized pt's goals from tx plan and thoughts and feelings processing. UDS results were returned to some members.       Group Time: 2:30-4  Participation Level: Active  Behavioral Response: Appropriate and Sharing  Type of Therapy: Psycho-education Group  Interventions: Psychosocial Skills: Teacher, adult education, Supportive and Other: Serenity Prayer, Control  Topic: Patients were active and engaged in group psychoeducation session. Counselors led lesson on 12 Step Serenity Prayer and "control". Pts were provided a handout and asked to fill in 5 things they can control and 5 things they cannot control and then asked to share.    Summary: Pt was active and engaged in session. She reported she attended 1 AA meeting that morning, since last group. Pt stated she enjoyed the chaplain who attended yesterday's group session. She is learning to be more accepting of her thoughts and feelings. Pt stated she "feels that she's had an epiphany that she does believe in God" since she often finds herself praying to a HP when in crisis.    UDS collected: No Results: negative  AA/NA attended?: YesThursday  Sponsor?: No   Wes Swan, LPCA LCASA 07/12/2017 2:09 PM

## 2017-07-13 ENCOUNTER — Other Ambulatory Visit (HOSPITAL_COMMUNITY): Payer: Medicare Other

## 2017-07-19 ENCOUNTER — Other Ambulatory Visit (HOSPITAL_COMMUNITY): Payer: Medicare Other | Admitting: Psychology

## 2017-07-19 DIAGNOSIS — F1020 Alcohol dependence, uncomplicated: Secondary | ICD-10-CM | POA: Diagnosis not present

## 2017-07-20 ENCOUNTER — Other Ambulatory Visit (HOSPITAL_COMMUNITY): Payer: Medicare Other | Admitting: Psychology

## 2017-07-20 DIAGNOSIS — F1020 Alcohol dependence, uncomplicated: Secondary | ICD-10-CM

## 2017-07-24 ENCOUNTER — Other Ambulatory Visit (HOSPITAL_COMMUNITY): Payer: Medicare Other | Attending: Psychiatry | Admitting: Psychology

## 2017-07-24 DIAGNOSIS — Z6372 Alcoholism and drug addiction in family: Secondary | ICD-10-CM | POA: Diagnosis not present

## 2017-07-24 DIAGNOSIS — Z170 Estrogen receptor positive status [ER+]: Secondary | ICD-10-CM | POA: Diagnosis not present

## 2017-07-24 DIAGNOSIS — Z62811 Personal history of psychological abuse in childhood: Secondary | ICD-10-CM | POA: Insufficient documentation

## 2017-07-24 DIAGNOSIS — F3289 Other specified depressive episodes: Secondary | ICD-10-CM | POA: Insufficient documentation

## 2017-07-24 DIAGNOSIS — C50211 Malignant neoplasm of upper-inner quadrant of right female breast: Secondary | ICD-10-CM | POA: Diagnosis not present

## 2017-07-24 DIAGNOSIS — J302 Other seasonal allergic rhinitis: Secondary | ICD-10-CM | POA: Insufficient documentation

## 2017-07-24 DIAGNOSIS — Z630 Problems in relationship with spouse or partner: Secondary | ICD-10-CM | POA: Insufficient documentation

## 2017-07-24 DIAGNOSIS — F1020 Alcohol dependence, uncomplicated: Secondary | ICD-10-CM | POA: Diagnosis present

## 2017-07-24 DIAGNOSIS — F17200 Nicotine dependence, unspecified, uncomplicated: Secondary | ICD-10-CM | POA: Insufficient documentation

## 2017-07-24 DIAGNOSIS — Z9189 Other specified personal risk factors, not elsewhere classified: Secondary | ICD-10-CM | POA: Diagnosis not present

## 2017-07-24 DIAGNOSIS — F10982 Alcohol use, unspecified with alcohol-induced sleep disorder: Secondary | ICD-10-CM | POA: Insufficient documentation

## 2017-07-25 ENCOUNTER — Encounter (HOSPITAL_COMMUNITY): Payer: Self-pay | Admitting: Psychology

## 2017-07-25 NOTE — Progress Notes (Signed)
Marilyn Dalton is a 77 y.o. female patient. CD-IOP: Discharge Planning. Met with the patient as scheduled this morning. She had come directly from the [place] meeting and saw two other group members in attendance. The patient reported she has really enjoyed the p [program, but recognizes it is time for her to move on. I agreed with her and pointed out the progress she has made. The patient laughed and agreed that she really enjoys the [place] meetings and will go to those for the rest of my life. We discussed her plans going forward. She will continue to take the Effexor along with the naltrexone. We agreed that the medical director will meet with her tomorrow and provide some refills, but I suggested that her PCP would be able to continue to medication. She signed a consent allowing me to contact Breejante J. Jimmye Norman at [facility] in [place]. I agreed to write a letter informing her of her patient's success in treatment and the importance of Naltrexone in her recovery. The patient reported she would consider attending the Aftercare program here for a few weeks. The patient leaves the program with a clear understanding of the need for total abstinence and a newfound enthusiasm and energy for life. The discharge plan was completed. She will return to group tomorrow and graduate from the CD-IOP on Thursday, July 27, 2017. The patient's sobriety date remains 05/23/17        Brandon Melnick, LCAS

## 2017-07-25 NOTE — Progress Notes (Signed)
    Daily Group Progress Note  Program: CD-IOP   07/25/2017 Marilyn Dalton [id number]  Diagnosis: Alcohol Use Disorder, Severe  Sobriety Date: 05/23/17  Group Time: 1-2:30pm  Participation Level: Minimal  Behavioral Response: Appropriate  Type of Therapy: Psycho-education Group  Interventions: Medication Education  Topic: Psychoeducation: the first half of group was spent with a visiting pharmacist. EP, from [facility] spoke with the group about mental illness, medications along with the newest meds to address cravings. There were several questions and the group was engaged and active in this session. Six random drug tests were collected during group and the medical director met with one group member to discuss his discharge plans.  Group Time: 2:30-4pm  Participation Level: Active  Behavioral Response: Sharing  Type of Therapy: Process Group  Interventions: Supportive  Topic: Process: The second half of group was spent in process. Members shared about the long holiday weekend and the challenges and struggles that had appeared along with the successes. The reports were detailed with many activities engaged in throughout the weekend with good comments and feedback among group members.   Summary: The patient appeared today for the first time in over a week. She had traveled with her husband too [place] to visit their family. She was attentive but shared little in the session with the pharmacist. At one point, she mentioned that one medication she was taking was causing her bad dreams, but upon further questioning, the patient admitted she was mistaken and wasn't taking that medication. In the psycho-ed, she reported having had a wonderful trip and had enjoyed hearing about her grandchildren. The patient's report seemed to contradict her disclosures. While she was happy to hear her grandchildren were doing well, she also admitted, "I don't really know my  grandchildren" and "I don't really like them". The patient reported her daughter had spoken briefly about her call with her counselor and had told her, "you have been an alcoholic for years". The patient insisted she had never been drunk or inappropriate and seemed to discount her daughter's memory. This is not uncommon for this patient to negate any comment or observation that does not meet her expectations or 'reality'. The patient seems to be in denial about the impact her drinking has had on her family and refuses to consider anyone else's perspective.  UDS collected: Yes Results: negative  AA/NA attended?: YesWednesday  Sponsor?: No   Brandon Melnick, LCAS 07/25/2017 8:19 AM

## 2017-07-26 ENCOUNTER — Other Ambulatory Visit (INDEPENDENT_AMBULATORY_CARE_PROVIDER_SITE_OTHER): Payer: Medicare Other | Admitting: Psychology

## 2017-07-26 ENCOUNTER — Encounter (HOSPITAL_COMMUNITY): Payer: Self-pay | Admitting: Medical

## 2017-07-26 DIAGNOSIS — F1024 Alcohol dependence with alcohol-induced mood disorder: Secondary | ICD-10-CM

## 2017-07-26 DIAGNOSIS — Z62811 Personal history of psychological abuse in childhood: Secondary | ICD-10-CM

## 2017-07-26 DIAGNOSIS — F10982 Alcohol use, unspecified with alcohol-induced sleep disorder: Secondary | ICD-10-CM | POA: Diagnosis not present

## 2017-07-26 DIAGNOSIS — F3289 Other specified depressive episodes: Secondary | ICD-10-CM

## 2017-07-26 DIAGNOSIS — F10282 Alcohol dependence with alcohol-induced sleep disorder: Secondary | ICD-10-CM

## 2017-07-26 DIAGNOSIS — Z6372 Alcoholism and drug addiction in family: Secondary | ICD-10-CM

## 2017-07-26 DIAGNOSIS — C50211 Malignant neoplasm of upper-inner quadrant of right female breast: Secondary | ICD-10-CM

## 2017-07-26 DIAGNOSIS — Z9189 Other specified personal risk factors, not elsewhere classified: Secondary | ICD-10-CM

## 2017-07-26 DIAGNOSIS — Z170 Estrogen receptor positive status [ER+]: Secondary | ICD-10-CM

## 2017-07-26 DIAGNOSIS — F17200 Nicotine dependence, unspecified, uncomplicated: Secondary | ICD-10-CM

## 2017-07-26 DIAGNOSIS — J302 Other seasonal allergic rhinitis: Secondary | ICD-10-CM

## 2017-07-26 DIAGNOSIS — IMO0001 Reserved for inherently not codable concepts without codable children: Secondary | ICD-10-CM

## 2017-07-26 DIAGNOSIS — F1020 Alcohol dependence, uncomplicated: Secondary | ICD-10-CM

## 2017-07-26 DIAGNOSIS — Z630 Problems in relationship with spouse or partner: Secondary | ICD-10-CM

## 2017-07-26 MED ORDER — NALTREXONE HCL 50 MG PO TABS
50.0000 mg | ORAL_TABLET | Freq: Every day | ORAL | 2 refills | Status: AC
Start: 2017-07-26 — End: 2017-10-24
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
## 2017-07-26 NOTE — Addendum Note (Signed)
Addended by: Dara Hoyer on: 07/26/2017 03:44 PM   Modules accepted: Orders

## 2017-07-27 ENCOUNTER — Other Ambulatory Visit (HOSPITAL_COMMUNITY): Payer: Medicare Other | Admitting: Psychology

## 2017-07-27 DIAGNOSIS — F1020 Alcohol dependence, uncomplicated: Secondary | ICD-10-CM

## 2017-07-27 DIAGNOSIS — F10982 Alcohol use, unspecified with alcohol-induced sleep disorder: Secondary | ICD-10-CM | POA: Diagnosis not present

## 2017-07-28 ENCOUNTER — Encounter (HOSPITAL_COMMUNITY): Payer: Self-pay | Admitting: Psychology

## 2017-07-28 NOTE — Progress Notes (Signed)
    Daily Group Progress Note  Program: CD-IOP   07/28/2017 [name] [id number]  Diagnosis:  Alcohol use disorder, severe, dependence ([place])   Sobriety Date: 4/2  Group Time: 1-2:30  Participation Level: Active  Behavioral Response: Appropriate and Sharing  Type of Therapy: Process Group  Interventions: CBT  Topic: Patients were active and engaged in group process session. Counselors facilitated discussion on recovery goals, relapse prevention, improved communication, and interpersonal conflict. Counselors encouraged pts to disclose thoughts and feelings concerning recovery and mental health issues.      Group Time: 2:30-4  Participation Level: Active  Behavioral Response: Appropriate and Sharing  Type of Therapy: Psycho-education Group  Interventions: CBT  Topic: Patients were active and engaged in group psychoeducation session. Counselors led lesson on the progressive nature of disease of addiction w/ Jellanik curve handout. Pts were instructed to write down their personal Jellanik curve diagram.    Summary: Pt was active and engaged in group. She reported she attended 1 AA meeting at her normal [place] that morning. Pt continues to report strong progress in increasing her mood and energy. She states she feels "meantally clearer" and is noticing improvements in her communication w/ her husband. She states she is happy her dogs are back to good health and spent time doing self care through petting her dogs for 57mins. Pt became significantly tearful during [place] as other members read their stories of addiction. Pt stated that she "was so overwhelmed by how bad everyone else's stories were and was grateful that she her bottom "hit at such an old age". Pt reported on her meeting topic and felt it was helpful to group.    UDS collected: No Results: negative  AA/NA attended?: YesThursday  Sponsor?: No   Wes Swan, LPCA LCASA 07/28/2017 1:32  PM

## 2017-07-31 ENCOUNTER — Other Ambulatory Visit (HOSPITAL_COMMUNITY): Payer: Medicare Other

## 2017-07-31 ENCOUNTER — Encounter (HOSPITAL_COMMUNITY): Payer: Self-pay | Admitting: Psychology

## 2017-07-31 NOTE — Progress Notes (Signed)
    Daily Group Progress Note  Program: CD-IOP   07/31/2017 [name] [id number]  Diagnosis:  Alcohol use disorder, severe, dependence ([place])   Sobriety Date: 4/2  Group Time: 1-2:30  Participation Level: Active  Behavioral Response: Appropriate and Sharing  Type of Therapy: Process Group  Interventions: CBT, Strength-based and Supportive  Topic: Patients were active and engaged in group process session. Counselors facilitated discussion on recovery goals, relapse prevention, improved communication, and interpersonal conflict. Counselors encouraged pts to disclose thoughts and feelings concerning recovery and mental health issues. One new pt shared about their addiction to alcohol and met w/ Market researcher. USD results were collected from some members.      Group Time: 2:30-4  Participation Level: Active  Behavioral Response: Appropriate and Sharing  Type of Therapy: Psycho-education Group  Interventions: Meditation: Chair Yoga  Topic: Patients were active and engaged in group psychoeducation session. A guest counselor led 1 hour chair yoga session w/ an emphasis on relaxation, breath work, and intention. Pts participated actively.    Summary: Pt was active and engaged in session. She reported she attended 2 AA meetings over weekend and picked up her 60-day chip. Pt states she was "very busy and productive" this weekend, made 20 jars of homemade jam and worked in her yard. She states she continues to feel an uptick in her mood and energy level. Pt announced that she plans to graduate this Thursday from group and expressed gratitude and appreciation. This request was unknown to counselors, but counselors reflected to pt that she has worked hard in group over past 18 sessions.   UDS collected: No Results: negative  AA/NA attended?: YesMonday and Saturday  Sponsor?: No   Youlanda Roys, LPCA LCASA 07/31/2017 9:31 AM
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
## 2017-07-31 NOTE — Progress Notes (Signed)
    Daily Group Progress Note  Program: CD-IOP   07/31/2017 Marilyn Dalton [id number]  Diagnosis: Alcohol Use Disorder, Severe  Sobriety Date: 05/23/17  Group Time: 1-2:30pm  Participation Level: Active  Behavioral Response: Appropriate  Type of Therapy: Process Group  Interventions: Supportive  Topic: Process: The first half of group was spent in process. Members shared about any challenges or struggles they have faced since we last met. During the brief check-in, one member checked in with a sobriety date of tomorrow. He had drunk this morning, prior to attending the [place] meeting. Much of the session was spent discussing relapse and how recovery is not typically linear but can include a return to use.   Group Time: 2:30-4pm  Participation Level: Active  Behavioral Response: Sharing  Type of Therapy: Psycho-education Group  Interventions: Supportive  Topic: Psycho-Ed: Guest Speaker/Graduation. The second half of group today consisted of a visiting former group member. He had successfully completed the program approximately nine months ago and had asked to be allowed to visit the group and share his story. The patient is a very intense fellow and was very focused about the things he had done to get him into the program and what he had done since then to remain sober. Members asked questions of the guest at the conclusion of his talk. Almost everyone seemed very compelled and inspired by the guest speaker. The session ended with a graduation ceremony. Kind words were shared with the graduating member and she wished each member well in the future.   Summary: The patient reported she had opted to miss the AA meeting this morning because she had asked to bake the brownies for her graduation today. She provided helpful feedback to the newest member, who was very disgusted with himself. The patient was attentive during the guest speaker's presentation and spoke admiringly of him after  he completed his talk. During the graduation ceremony, the patient was teary as she talked about how much the group had helped her and how much "I am going to miss this group". She shared how proud she was of her fellow group members and encouraged them to continue their work and focus on today. The patient was very grateful, and members spoke highly of her spirit and courage. She leaves with a very clear understanding of her daily recovery needs and a commitment to remain active and engaged in the [employer] of [employer].   UDS collected: No  AA/NA attended?: No  Sponsor?: No   Brandon Melnick, LCAS 07/31/2017 11:06 AM

## 2017-08-02 ENCOUNTER — Other Ambulatory Visit (HOSPITAL_COMMUNITY): Payer: Medicare Other

## 2017-08-03 ENCOUNTER — Other Ambulatory Visit (HOSPITAL_COMMUNITY): Payer: Medicare Other

## 2017-08-07 ENCOUNTER — Other Ambulatory Visit (HOSPITAL_COMMUNITY): Payer: Medicare Other

## 2017-08-09 ENCOUNTER — Other Ambulatory Visit (HOSPITAL_COMMUNITY): Payer: Medicare Other

## 2017-08-10 ENCOUNTER — Other Ambulatory Visit (HOSPITAL_COMMUNITY): Payer: Medicare Other

## 2017-08-14 ENCOUNTER — Other Ambulatory Visit (HOSPITAL_COMMUNITY): Payer: Medicare Other

## 2017-08-16 ENCOUNTER — Telehealth: Payer: Self-pay

## 2017-08-16 ENCOUNTER — Other Ambulatory Visit (HOSPITAL_COMMUNITY): Payer: Medicare Other

## 2017-08-16 NOTE — Telephone Encounter (Signed)
NOTES SENT TO SCHEDULING FROM [place].

## 2017-08-17 ENCOUNTER — Other Ambulatory Visit (HOSPITAL_COMMUNITY): Payer: Medicare Other

## 2017-08-21 ENCOUNTER — Other Ambulatory Visit (HOSPITAL_COMMUNITY): Payer: Medicare Other

## 2017-08-23 ENCOUNTER — Other Ambulatory Visit (HOSPITAL_COMMUNITY): Payer: Medicare Other

## 2017-08-28 ENCOUNTER — Other Ambulatory Visit (HOSPITAL_COMMUNITY): Payer: Medicare Other

## 2017-08-30 ENCOUNTER — Other Ambulatory Visit (HOSPITAL_COMMUNITY): Payer: Medicare Other

## 2017-08-31 ENCOUNTER — Other Ambulatory Visit (HOSPITAL_COMMUNITY): Payer: Medicare Other

## 2017-09-28 ENCOUNTER — Ambulatory Visit (INDEPENDENT_AMBULATORY_CARE_PROVIDER_SITE_OTHER): Payer: Medicare Other | Admitting: Cardiology

## 2017-09-28 ENCOUNTER — Encounter: Payer: Self-pay | Admitting: Cardiology

## 2017-09-28 VITALS — BP 116/64 | HR 97

## 2017-09-28 DIAGNOSIS — Z923 Personal history of irradiation: Secondary | ICD-10-CM | POA: Insufficient documentation

## 2017-09-28 DIAGNOSIS — I447 Left bundle-branch block, unspecified: Secondary | ICD-10-CM

## 2017-09-28 DIAGNOSIS — I951 Orthostatic hypotension: Secondary | ICD-10-CM

## 2017-09-28 DIAGNOSIS — Z9221 Personal history of antineoplastic chemotherapy: Secondary | ICD-10-CM | POA: Diagnosis not present

## 2017-09-28 NOTE — Progress Notes (Signed)
Cardiology Office Note:    Date:  09/28/2017   ID:  Marilyn Dalton, DOB 1940/12/22, MRN [id number]  PCP:  Heywood Bene, PA-C  Cardiologist:  Buford Dresser, MD PhD  Referring MD: Heywood Bene, *   Chief Complaint  Patient presents with  . Follow-up    ABN EKG.  . Headache    History of Present Illness:    Marilyn Dalton is a 77 y.o. female with a hx of breast cancer s/p chemo and radiation, tobacco use who is seen at the request of Clyde Lundborg for evaluation of lightheadedness and abnormal EKG.   Records reviewed, concern was that she began having lightheadedness and highly fluctuating blood pressures after undergoing detoxification from severe alcohol abuse and starting naltrexone (05/22/17). Orthostatics were reported as negative at that time. EKG was performed and showed sinus rhythm with left bundle branch block.  Patient concerns: lightheadedness with changing position  Has been going on for about the last year. Movement/changing position is the trigger. Better with being still, deep breaths. Happens many times per day. No syncope. Stable, not worsening or improving. Drinks a lot of fluid--4-5 glasses of water in the morning, drinks lemonade throughout the day.   No documented history of neuropathy, but does endorse numbness in her feet. No chest pain, shortness of breath, PND, orthopnea, LE edema, or weight gain. Does avoid salt in her diet, but overall reports eating well.  Past Medical History:  Diagnosis Date  . Allergy   . Arthritis    fingers  . Breast cancer ([place]) 06/20/14   right breast  . Diverticulitis   . GERD (gastroesophageal reflux disease)   . Personal history of chemotherapy   . Personal history of radiation therapy     Past Surgical History:  Procedure Laterality Date  . ABDOMINAL HYSTERECTOMY  ~ 40 years ago  . APPENDECTOMY    . BREAST BIOPSY Left 10/6/210   no malignancy,extensive stromal fibrosis  . BREAST  BIOPSY Right 06/20/14   invasive ductal ca,dcis   . BREAST LUMPECTOMY Right   . BREAST LUMPECTOMY WITH RADIOACTIVE SEED AND SENTINEL LYMPH NODE BIOPSY Right 12/02/2014   Procedure: RIGHT BREAST LUMPECTOMY WITH RADIOACTIVE SEED LOCALIZATION AND SENTINEL LYMPH NODE BIOPSY;  Surgeon: [name], MD;  Location: [facility];  Service: General;  Laterality: Right;  . COLON SURGERY  10/13/08   cecum polyp=adenomatous ,no high grade dysplasia or invasive malignancy  . HAND SURGERY     RIGHT  . KNEE ARTHROSCOPY     RT  . PORTACATH PLACEMENT Left 07/11/2014   Procedure: INSERTION PORT-A-CATH;  Surgeon: [name], MD;  Location: [facility];  Service: General;  Laterality: Left;  . TONSILLECTOMY      Current Medications: Current Outpatient Medications on File Prior to Visit  Medication Sig  . b complex vitamins tablet Take 1 tablet by mouth daily.  . Cholecalciferol (D3 ADULT PO) Take by mouth daily.  [name] letrozole (FEMARA) 2.5 MG tablet TAKE 1 TABLET(2.5 MG) BY MOUTH DAILY  . mirtazapine (REMERON SOL-TAB) 30 MG disintegrating tablet   . naltrexone (DEPADE) 50 MG tablet Take 1 tablet (50 mg total) by mouth daily.  . vitamin E 400 UNIT capsule Take 400 Units by mouth daily.   No current facility-administered medications on file prior to visit.      Allergies:   Patient has no known allergies.   Social History   Socioeconomic History  . Marital status: Married    Spouse  name: Not on file  . Number of children: Not on file  . Years of education: Not on file  . Highest education level: Not on file  Occupational History  . Not on file  Social Needs  . Financial resource strain: Not on file  . Food insecurity:    Worry: Not on file    Inability: Not on file  . Transportation needs:    Medical: Not on file    Non-medical: Not on file  Tobacco Use  . Smoking status: Current Every Day Smoker    Packs/day: 0.50    Years: 56.00    Pack years: 28.00  . Smokeless  tobacco: Never Used  Substance and Sexual Activity  . Alcohol use: Yes    Comment: OCCASIONAL  . Drug use: No  . Sexual activity: Not on file  Lifestyle  . Physical activity:    Days per week: Not on file    Minutes per session: Not on file  . Stress: Not on file  Relationships  . Social connections:    Talks on phone: Not on file    Gets together: Not on file    Attends religious service: Not on file    Active member of club or organization: Not on file    Attends meetings of clubs or organizations: Not on file    Relationship status: Not on file  Other Topics Concern  . Not on file  Social History Narrative  . Not on file     Family History: The patient's family history includes CVA in her mother.  ROS:   Please see the history of present illness.  Additional pertinent ROS: ROS   EKGs/Labs/Other Studies Reviewed:    The following studies were reviewed today: Records from her PCP Prior echo studies during her chemotherapy. Most recent: Echo 08/31/2015 Study Conclusions  - Left ventricle: Gobal longitudinal LV strain is normal at -17.4%   The cavity size was normal. Systolic function was normal. The   estimated ejection fraction was in the range of 60% to 65%. Wall   motion was normal; there were no regional wall motion   abnormalities. There was an increased relative contribution of   atrial contraction to ventricular filling. Doppler parameters are   consistent with abnormal left ventricular relaxation (grade 1   diastolic dysfunction). - Aortic valve: Trileaflet; mildly thickened, mildly calcified   leaflets. - Mitral valve: Elongation of the anterior leaflet. - Tricuspid valve: There was mild regurgitation. - Pulmonary arteries: PA peak pressure: 32 mm Hg (S).   EKG:  EKG is ordered today.  The ekg ordered today demonstrates normal sinus rhythm, biatrial enlargement, left bundle branch block.  Recent Labs: No results found for requested labs within last  8760 hours.  Recent Lipid Panel No results found for: CHOL, TRIG, HDL, CHOLHDL, VLDL, LDLCALC, LDLDIRECT  Physical Exam:    VS:  BP 116/64 (BP Location: Left Arm, Patient Position: Sitting, Cuff Size: Normal)   Pulse 97     Wt Readings from Last 3 Encounters:  01/09/17 107 lb 1.6 oz (48.6 kg)  07/08/16 108 lb 4.8 oz (49.1 kg)  04/05/16 109 lb 11.2 oz (49.8 kg)    Orthostatic VS for the past 24 hrs (Last 3 readings):  BP- Lying Pulse- Lying BP- Sitting Pulse- Sitting BP- Standing at 0 minutes Pulse- Standing at 0 minutes BP- Standing at 3 minutes Pulse- Standing at 3 minutes  09/28/17 0830 136/87 97 125/85 101 97/74 124 (!) 126/91 124  GEN: Thin, frail appearing, in no acute distress HEENT: Normal NECK: No JVD; No carotid bruits LYMPHATICS: No lymphadenopathy CARDIAC: regular rhythm, normal S1 and S2, no murmurs, rubs. +S4. Radial and DP pulses 2+ bilaterally. RESPIRATORY:  Clear to auscultation without rales, wheezing or rhonchi  ABDOMEN: Soft, non-tender, non-distended MUSCULOSKELETAL:  No edema; No deformity  SKIN: Warm and dry NEUROLOGIC:  Alert and oriented x 3 PSYCHIATRIC:  Normal affect   ASSESSMENT:    1. Orthostatic hypotension   2. Personal history of antineoplastic chemotherapy   3. Personal history of radiation therapy   4. LBBB (left bundle branch block)    PLAN:    1. Orthostatic hypotension -meets criteria for orthostasis immediately on standing, then resolves after several minutes. Recapitulates her symptoms. Has not had syncope or falls. Suspect this may be due to autonomic neuropathy. May be secondary to prior longstanding alcohol use or potentially chemotherapy. -discussed conservative measures, such as staying hydrated, actually increasing salt intake, slowly changing position, avoiding heat and dehydration. Discussed the possibility of medical management as well (florinef, midodrine, etc). She prefers not to use medications at this time. Will follow up  her symptoms at the next visit.  2. LBBB, unclear chronology -no chest pain or other cardiac symptoms. Does have a history of chemotherapy and radiation for breast cancer. No elevated JVD to suggest constriction or right sided heart failure, lungs clear so no suggestion of left sided heart failure.  -repeat echocardiogram to rule out structural heart disease. Prior echo at the end of active chemotherapy in 2017 was normal LV function.  Plan for follow up: echo, follow up in 3 mos  Medication Adjustments/Labs and Tests Ordered: Current medicines are reviewed at length with the patient today.  Concerns regarding medicines are outlined above.  Orders Placed This Encounter  Procedures  . EKG 12-Lead  . ECHOCARDIOGRAM COMPLETE   No orders of the defined types were placed in this encounter.   Patient Instructions  Medication Instructions: Your physician recommends that you continue on your current medications as directed.    If you need a refill on your cardiac medications before your next appointment, please call your pharmacy.   Labwork: None  Procedures/Testing: Your physician has requested that you have an echocardiogram. Echocardiography is a painless test that uses sound waves to create images of your heart. It provides your doctor with information about the size and shape of your heart and how well your heart's chambers and valves are working. This procedure takes approximately one hour. There are no restrictions for this procedure. [facility]     Follow-Up: Your physician wants you to follow-up in 3 months with Dr. Harrell Gave.    Special Instructions:    Thank you for choosing [facility] at [facility]!!       Signed, Buford Dresser, MD PhD 09/28/2017 10:16 AM    [place]

## 2017-09-28 NOTE — Patient Instructions (Addendum)
Medication Instructions: Your physician recommends that you continue on your current medications as directed.    If you need a refill on your cardiac medications before your next appointment, please call your pharmacy.   Labwork: None  Procedures/Testing: Your physician has requested that you have an echocardiogram. Echocardiography is a painless test that uses sound waves to create images of your heart. It provides your doctor with information about the size and shape of your heart and how well your heart's chambers and valves are working. This procedure takes approximately one hour. There are no restrictions for this procedure. [facility]     Follow-Up: Your physician wants you to follow-up in 3 months with Dr. Harrell Gave.    Special Instructions:    Thank you for choosing [facility] at [facility]!!

## 2017-09-29 ENCOUNTER — Encounter

## 2017-10-09 ENCOUNTER — Ambulatory Visit (HOSPITAL_COMMUNITY)
Admission: RE | Admit: 2017-10-09 | Discharge: 2017-10-09 | Disposition: A | Discharge status: Home / Self Care | Payer: Medicare Other | Source: Ambulatory Visit | Attending: Cardiology | Admitting: Cardiology

## 2017-10-09 DIAGNOSIS — I951 Orthostatic hypotension: Secondary | ICD-10-CM | POA: Insufficient documentation

## 2017-10-09 NOTE — Progress Notes (Signed)
  Echocardiogram 2D Echocardiogram has been performed.  Marilyn Dalton M 10/09/2017, 1:32 PM

## 2017-10-31 ENCOUNTER — Other Ambulatory Visit (HOSPITAL_COMMUNITY): Payer: Self-pay | Admitting: Medical

## 2017-12-29 ENCOUNTER — Ambulatory Visit: Payer: Medicare Other | Admitting: Cardiology

## 2017-12-29 DIAGNOSIS — R0989 Other specified symptoms and signs involving the circulatory and respiratory systems: Secondary | ICD-10-CM

## 2018-01-04 ENCOUNTER — Telehealth: Payer: Self-pay | Admitting: General Practice

## 2018-01-04 ENCOUNTER — Encounter: Payer: Self-pay | Admitting: General Practice

## 2018-01-04 ENCOUNTER — Encounter: Payer: Self-pay | Admitting: Cardiology

## 2018-01-04 NOTE — Telephone Encounter (Signed)
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  Left VM for patient - CSW can provide referral to community resources or counseling intern at [facility].  Will also refer patient to [facility] for their caregiver support groups, patient agreeable to this referral.  [name], LCSW Clinical Social Worker Phone:  [phone]

## 2018-01-04 NOTE — Progress Notes (Signed)
[place] CSW Progress Notes  Patient referred to [facility] for caregiver support via [place] [facility].  Provider will reach out directly to patient.  [name], LCSW Clinical Social Worker Phone:  [phone]

## 2018-01-08 ENCOUNTER — Inpatient Hospital Stay: Payer: Medicare Other | Attending: Hematology and Oncology | Admitting: Hematology and Oncology

## 2018-01-08 DIAGNOSIS — C50211 Malignant neoplasm of upper-inner quadrant of right female breast: Secondary | ICD-10-CM | POA: Insufficient documentation

## 2018-01-08 DIAGNOSIS — Z79811 Long term (current) use of aromatase inhibitors: Secondary | ICD-10-CM | POA: Diagnosis not present

## 2018-01-08 DIAGNOSIS — I10 Essential (primary) hypertension: Secondary | ICD-10-CM | POA: Insufficient documentation

## 2018-01-08 DIAGNOSIS — Z79899 Other long term (current) drug therapy: Secondary | ICD-10-CM

## 2018-01-08 DIAGNOSIS — Z170 Estrogen receptor positive status [ER+]: Secondary | ICD-10-CM | POA: Insufficient documentation

## 2018-01-08 DIAGNOSIS — Z9221 Personal history of antineoplastic chemotherapy: Secondary | ICD-10-CM | POA: Insufficient documentation

## 2018-01-08 DIAGNOSIS — F329 Major depressive disorder, single episode, unspecified: Secondary | ICD-10-CM | POA: Diagnosis not present

## 2018-01-08 DIAGNOSIS — Z923 Personal history of irradiation: Secondary | ICD-10-CM | POA: Insufficient documentation

## 2018-01-08 MED ORDER — LETROZOLE 2.5 MG PO TABS: ORAL_TABLET | ORAL | 3 refills | Status: DC

## 2018-01-08 NOTE — Assessment & Plan Note (Signed)
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 Adjuvant antiestrogen therapy with anastrozole started January 2017 stopped March 2017 letrozole resumed 05/22/2017 2. 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 On proton pump inhibitor therapy.  Patient is taking herbal medication for this Major depression: Well controlled on Effexor 75 mg daily Severe hypertension: I instructed her to make an appointment and see her primary care physician.  Breast cancer surveillance:  1. Annual mammograms 04/14/2017: Benign breast density category D Bone density 07/15/2015: Normal 2. breast exam 01/08/2018: Benign  Return to clinic in 1 year for follow-up

## 2018-01-08 NOTE — Progress Notes (Signed)
Patient Care Team: Merwyn Katos as PCP - General (Physician Assistant)  DIAGNOSIS:  Encounter Diagnosis  Name Primary?  . Malignant neoplasm of upper-inner quadrant of right breast in female, estrogen receptor positive ([place])     SUMMARY OF ONCOLOGIC HISTORY:   Breast cancer of upper-inner quadrant of right female breast ([facility])   06/20/2014 Initial Diagnosis    right breast 3:00: Invasive ductal carcinoma with DCIS, grade 2,ER 90%, PR 1%,HER-2 positive ratio 2.97    06/30/2014 Breast MRI    Right breast 2.4 x 2.2 x 2.2 cm irregular mass, no abnormal lymph nodes    07/14/2014 - 10/28/2014 Neo-Adjuvant Chemotherapy    TCH Perjeta every 3 weeks 6 followed by Herceptin maintenance    11/06/2014 Breast MRI    Right breast mass has significantly decreased in size and is less masslike, maximal enhancement is 1.5 cm previously 2.4 cm    12/02/2014 Surgery    Right lumpectomy: Residual IDC grade 3; 2.4 cm with DCIS high-grade, posterior margin probably positive for invasive cancer, 1/4 lymph nodes positive with extracapsular extension, T2 N1 stage IIB    12/29/2014 - 01/26/2015 Radiation Therapy    Adjuvant radiation therapy    02/24/2015 -  Anti-estrogen oral therapy    Anastrozole 1 mg daily stopped due to hot flashes, emotional disturbances, decreased energy and decreased appetite stopped 05/19/15. Restarted 05/22/16     CHIEF COMPLIANT: Severe emotional upset  INTERVAL HISTORY: Marilyn Dalton is a 77 year old with above-mentioned history of right breast cancer treated with lumpectomy radiation after undergoing neoadjuvant chemotherapy.  She is currently on anastrozole.  She appears to be tolerating it fairly well.  She does have occasional hot flashes and myalgias.  Her husband has been going through major problems including a diagnosis of hepatocellular carcinoma.  He has not been feeling well and she has been the primary caregiver.  This is basically completely exhausted her  emotional state.  REVIEW OF SYSTEMS:   Constitutional: Denies fevers, chills or abnormal weight loss Eyes: Denies blurriness of vision Ears, nose, mouth, throat, and face: Denies mucositis or sore throat Respiratory: Denies cough, dyspnea or wheezes Cardiovascular: Denies palpitation, chest discomfort Gastrointestinal:  Denies nausea, heartburn or change in bowel habits Skin: Denies abnormal skin rashes Lymphatics: Denies new lymphadenopathy or easy bruising Neurological:Denies numbness, tingling or new weaknesses Behavioral/Psych: Mood is stable, no new changes  Extremities: No lower extremity edema  All other systems were reviewed with the patient and are negative.  I have reviewed the past medical history, past surgical history, social history and family history with the patient and they are unchanged from previous note.  ALLERGIES:  has No Known Allergies.  MEDICATIONS:  Current Outpatient Medications  Medication Sig Dispense Refill  . b complex vitamins tablet Take 1 tablet by mouth daily.    . Cholecalciferol (D3 ADULT PO) Take by mouth daily.    [name] letrozole (FEMARA) 2.5 MG tablet TAKE 1 TABLET(2.5 MG) BY MOUTH DAILY 90 tablet 3  . mirtazapine (REMERON SOL-TAB) 30 MG disintegrating tablet   1  . vitamin E 400 UNIT capsule Take 400 Units by mouth daily.     No current facility-administered medications for this visit.     PHYSICAL EXAMINATION: ECOG PERFORMANCE STATUS: 1 - Symptomatic but completely ambulatory  Vitals:   01/08/18 1328  BP: 132/79  Pulse: (!) 104  Resp: 17  Temp: 98 F (36.7 C)  SpO2: 97%   Filed Weights   01/08/18 1328  Weight: 102 lb 12.8 oz (46.6 kg)    GENERAL:alert, no distress and comfortable SKIN: skin color, texture, turgor are normal, no rashes or significant lesions EYES: normal, Conjunctiva are pink and non-injected, sclera clear OROPHARYNX:no exudate, no erythema and lips, buccal mucosa, and tongue normal  NECK: supple, thyroid  normal size, non-tender, without nodularity LYMPH:  no palpable lymphadenopathy in the cervical, axillary or inguinal LUNGS: clear to auscultation and percussion with normal breathing effort HEART: regular rate & rhythm and no murmurs and no lower extremity edema ABDOMEN:abdomen soft, non-tender and normal bowel sounds MUSCULOSKELETAL:no cyanosis of digits and no clubbing  NEURO: alert & oriented x 3 with fluent speech, no focal motor/sensory deficits EXTREMITIES: No lower extremity edema   LABORATORY DATA:  I have reviewed the data as listed CMP Latest Ref Rng & Units 07/07/2015 06/16/2015 05/26/2015  Glucose 70 - 140 mg/dl 101 95 118  BUN 7.0 - 26.0 mg/dL 9.1 7.5 13.3  Creatinine 0.6 - 1.1 mg/dL 0.8 0.8 0.8  Sodium 136 - 145 mEq/L 139 141 142  Potassium 3.5 - 5.1 mEq/L 5.0 4.7 4.7  Chloride 101 - 111 mmol/L - - -  CO2 22 - 29 mEq/L 26 25 26   Calcium 8.4 - 10.4 mg/dL 9.9 10.2 9.8  Total Protein 6.4 - 8.3 g/dL 7.6 7.9 7.5  Total Bilirubin 0.20 - 1.20 mg/dL 0.42 0.56 0.45  Alkaline Phos 40 - 150 U/L 64 71 60  AST 5 - 34 U/L 32 30 26  ALT 0 - 55 U/L 22 18 19     Lab Results  Component Value Date   WBC 6.1 07/07/2015   HGB 14.8 07/07/2015   HCT 43.4 07/07/2015   MCV 99.5 07/07/2015   PLT 258 07/07/2015   NEUTROABS 3.9 07/07/2015    ASSESSMENT & PLAN:  Breast cancer of upper-inner quadrant of right female 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, 2.4 x 2.2 x 2.2 cm mass which abuts the pectoral muscle, grade 2, invasive ductal carcinoma, ER 90%, PR 1%, Ki-67 20%, HER-2 positive ratio 2.97, T2 N0 M0 stage II A clinical stage Status post TCH Perjeta 6 cycles started 07/14/2014 completed 10/28/2014 Right lumpectomy 12/02/2014: Residual IDC grade 3; 2.4 cm with DCIS high-grade, posterior margin probably positive for invasive cancer, 1/4 lymph nodes positive with extracapsular extension, T2 N1 stage IIB Adj XRT completed 01/26/15  Current  treatment: 1. Adjuvant antiestrogen therapy with anastrozole started January 2017 stopped March 2017 letrozole resumed 05/22/2017 2. Herceptin maintenancecompleted May 2017 Echocardiogram 05/20/2015: EF 60-65%  Letrozole toxicities: Hot flashes Patient husband was diagnosed with hepatocellular carcinoma.  She has been extremely busy being the caregiver and this is exhausted her.  She is in tears and is requesting assistance from our social workers.  Vernie Shanks was able to meet with her today.  Major depression: Well controlled on Effexor 75 mg daily Severe hypertension: I instructed her to make an appointment and see her primary care physician.  Breast cancer surveillance:  1. Annual mammograms 04/14/2017: Benign breast density category D Bone density 07/15/2015: Normal 2. breast exam 01/08/2018: Benign  Return to clinic in 1 year for follow-up    No orders of the defined types were placed in this encounter.  The patient has a good understanding of the overall plan. she agrees with it. she will call with any problems that may develop before the next visit here.   Harriette Ohara, MD 01/08/18

## 2018-01-09 ENCOUNTER — Encounter: Payer: Self-pay | Admitting: *Deleted

## 2018-01-09 ENCOUNTER — Telehealth: Payer: Self-pay | Admitting: Hematology and Oncology

## 2018-01-09 NOTE — Telephone Encounter (Signed)
Called patient to advise of appt for 2020.  Left message.  Mailed calendar.

## 2018-01-09 NOTE — Progress Notes (Signed)
[place] [employer]  Holiday representative received referral from medical oncology for emotional support.  CSW met with patient in exam room to offer support and assess for needs.  Patient expressed caregiver stress and the need of additional support.  CSW validated patients feelings and discussed possible resources.  CSW provided patient with information on [employer] through [employer].  This free resources will help identify patients needs and resources available.  CSW and patient also discussed counseling options.  Patient was agreeable to a counseling referral.  CSW contacted [facility].  They requested the patient call to schedule appointments.  CSW shared this information with patient and contact information for [facility].  CSW encouraged patient to call if she has any difficulties contacting agency or questions/concerns.    Johnnye Lana, MSW, LCSW, OSW-C Clinical Social Worker [facility] [phone]

## 2018-01-09 NOTE — Telephone Encounter (Signed)
Scheduled appt per 11/18 sch message - one year follow up - reminder letter sent in the mail with appt date and time

## 2018-01-10 ENCOUNTER — Other Ambulatory Visit: Payer: Self-pay | Admitting: Hematology and Oncology

## 2018-01-10 DIAGNOSIS — Z170 Estrogen receptor positive status [ER+]: Principal | ICD-10-CM

## 2018-01-10 DIAGNOSIS — C50211 Malignant neoplasm of upper-inner quadrant of right female breast: Secondary | ICD-10-CM

## 2018-02-11 ENCOUNTER — Other Ambulatory Visit: Payer: Self-pay | Admitting: Hematology and Oncology

## 2018-02-11 DIAGNOSIS — C50211 Malignant neoplasm of upper-inner quadrant of right female breast: Secondary | ICD-10-CM

## 2018-02-11 DIAGNOSIS — Z170 Estrogen receptor positive status [ER+]: Secondary | ICD-10-CM

## 2018-02-11 DIAGNOSIS — F332 Major depressive disorder, recurrent severe without psychotic features: Secondary | ICD-10-CM

## 2018-07-06 ENCOUNTER — Encounter

## 2018-11-05 ENCOUNTER — Other Ambulatory Visit: Payer: Self-pay | Admitting: Hematology and Oncology

## 2018-11-05 ENCOUNTER — Other Ambulatory Visit: Payer: Self-pay | Admitting: General Surgery

## 2018-11-05 DIAGNOSIS — Z9889 Other specified postprocedural states: Secondary | ICD-10-CM

## 2018-11-05 DIAGNOSIS — Z1231 Encounter for screening mammogram for malignant neoplasm of breast: Secondary | ICD-10-CM

## 2018-11-05 DIAGNOSIS — Z853 Personal history of malignant neoplasm of breast: Secondary | ICD-10-CM

## 2018-11-12 ENCOUNTER — Ambulatory Visit
Admission: RE | Admit: 2018-11-12 | Discharge: 2018-11-12 | Disposition: A | Discharge status: Home / Self Care | Payer: Medicare Other | Source: Ambulatory Visit | Attending: Hematology and Oncology | Admitting: Hematology and Oncology

## 2018-11-12 ENCOUNTER — Other Ambulatory Visit: Payer: Self-pay

## 2018-11-12 DIAGNOSIS — Z853 Personal history of malignant neoplasm of breast: Secondary | ICD-10-CM

## 2018-11-12 DIAGNOSIS — Z9889 Other specified postprocedural states: Secondary | ICD-10-CM

## 2018-11-12 DIAGNOSIS — Z1231 Encounter for screening mammogram for malignant neoplasm of breast: Secondary | ICD-10-CM

## 2019-01-08 NOTE — Progress Notes (Signed)
Patient Care Team: Merwyn Katos as PCP - General (Physician Assistant)  DIAGNOSIS:    ICD-10-CM   1. Malignant neoplasm of upper-inner quadrant of right breast in female, estrogen receptor positive ([place])  C50.211    Z17.0     SUMMARY OF ONCOLOGIC HISTORY: Oncology History  Breast cancer of upper-inner quadrant of right female breast ([place])  06/20/2014 Initial Diagnosis   Right breast 3:00: Invasive ductal carcinoma with DCIS, grade 2,ER 90%, PR 1%,HER-2 positive ratio 2.97   06/30/2014 Breast MRI   Right breast 2.4 x 2.2 x 2.2 cm irregular mass, no abnormal lymph nodes   07/14/2014 - 10/28/2014 Neo-Adjuvant Chemotherapy   TCH Perjeta every 3 weeks 6 followed by Herceptin maintenance   11/06/2014 Breast MRI   Right breast mass has significantly decreased in size and is less masslike, maximal enhancement is 1.5 cm previously 2.4 cm   12/02/2014 Surgery   Right lumpectomy: Residual IDC grade 3; 2.4 cm with DCIS high-grade, posterior margin probably positive for invasive cancer, 1/4 lymph nodes positive with extracapsular extension, T2 N1 stage IIB   12/29/2014 - 01/26/2015 Radiation Therapy   Adjuvant radiation therapy   02/24/2015 -  Anti-estrogen oral therapy   Anastrozole 1 mg daily stopped due to hot flashes, emotional disturbances, decreased energy and decreased appetite stopped 05/19/15. Letrozole restarted 05/22/16     CHIEF COMPLIANT: Follow-up of right breast cancer on anastrozole  INTERVAL HISTORY: Marilyn Dalton is a 78 y.o. with above-mentioned history of right breast cancer treated with neoadjuvant chemotherapy, lumpectomy, radiation, and who is currently on anti-estrogen therapy with letrozole. I last saw her a year ago. Mammogram on 11/12/18 showed no evidence of malignancy bilaterally. She presents to the clinic today for follow-up.   REVIEW OF SYSTEMS:   Constitutional: Denies fevers, chills or abnormal weight loss Eyes: Denies blurriness of vision Ears,  nose, mouth, throat, and face: Denies mucositis or sore throat Respiratory: Denies cough, dyspnea or wheezes Cardiovascular: Denies palpitation, chest discomfort Gastrointestinal: Denies nausea, heartburn or change in bowel habits Skin: Denies abnormal skin rashes Lymphatics: Denies new lymphadenopathy or easy bruising Neurological: Denies numbness, tingling or new weaknesses Behavioral/Psych: Mood is stable, no new changes  Extremities: No lower extremity edema Breast: denies any pain or lumps or nodules in either breasts All other systems were reviewed with the patient and are negative.  I have reviewed the past medical history, past surgical history, social history and family history with the patient and they are unchanged from previous note.  ALLERGIES:  has No Known Allergies.  MEDICATIONS:  Current Outpatient Medications  Medication Sig Dispense Refill  . b complex vitamins tablet Take 1 tablet by mouth daily.    . Cholecalciferol (D3 ADULT PO) Take by mouth daily.    [name] letrozole (FEMARA) 2.5 MG tablet TAKE 1 TABLET(2.5 MG) BY MOUTH DAILY 90 tablet 3  . letrozole (FEMARA) 2.5 MG tablet TAKE 1 TABLET(2.5 MG) BY MOUTH DAILY 90 tablet 0  . mirtazapine (REMERON SOL-TAB) 30 MG disintegrating tablet   1  . venlafaxine XR (EFFEXOR-XR) 75 MG 24 hr capsule TAKE 1 CAPSULE(75 MG) BY MOUTH DAILY 90 capsule 3  . vitamin E 400 UNIT capsule Take 400 Units by mouth daily.     No current facility-administered medications for this visit.     PHYSICAL EXAMINATION: ECOG PERFORMANCE STATUS: 1 - Symptomatic but completely ambulatory  Vitals:   01/09/19 1427  BP: (!) 138/97  Pulse: 94  Resp: 18  Temp: 98  F (36.7 C)  SpO2: 95%   Filed Weights   01/09/19 1427  Weight: 115 lb 4.8 oz (52.3 kg)    GENERAL: alert, no distress and comfortable SKIN: skin color, texture, turgor are normal, no rashes or significant lesions EYES: normal, Conjunctiva are pink and non-injected, sclera clear  OROPHARYNX: no exudate, no erythema and lips, buccal mucosa, and tongue normal  NECK: supple, thyroid normal size, non-tender, without nodularity LYMPH: no palpable lymphadenopathy in the cervical, axillary or inguinal LUNGS: clear to auscultation and percussion with normal breathing effort HEART: regular rate & rhythm and no murmurs and no lower extremity edema ABDOMEN: abdomen soft, non-tender and normal bowel sounds MUSCULOSKELETAL: no cyanosis of digits and no clubbing  NEURO: alert & oriented x 3 with fluent speech, no focal motor/sensory deficits EXTREMITIES: No lower extremity edema BREAST: No palpable masses or nodules in either right or left breasts. No palpable axillary supraclavicular or infraclavicular adenopathy no breast tenderness or nipple discharge. (exam performed in the presence of a chaperone)  LABORATORY DATA:  I have reviewed the data as listed CMP Latest Ref Rng & Units 07/07/2015 06/16/2015 05/26/2015  Glucose 70 - 140 mg/dl 101 95 118  BUN 7.0 - 26.0 mg/dL 9.1 7.5 13.3  Creatinine 0.6 - 1.1 mg/dL 0.8 0.8 0.8  Sodium 136 - 145 mEq/L 139 141 142  Potassium 3.5 - 5.1 mEq/L 5.0 4.7 4.7  Chloride 101 - 111 mmol/L - - -  CO2 22 - 29 mEq/L '26 25 26  '$ Calcium 8.4 - 10.4 mg/dL 9.9 10.2 9.8  Total Protein 6.4 - 8.3 g/dL 7.6 7.9 7.5  Total Bilirubin 0.20 - 1.20 mg/dL 0.42 0.56 0.45  Alkaline Phos 40 - 150 U/L 64 71 60  AST 5 - 34 U/L 32 30 26  ALT 0 - 55 U/L '22 18 19    '$ Lab Results  Component Value Date   WBC 6.1 07/07/2015   HGB 14.8 07/07/2015   HCT 43.4 07/07/2015   MCV 99.5 07/07/2015   PLT 258 07/07/2015   NEUTROABS 3.9 07/07/2015    ASSESSMENT & PLAN:  Breast cancer of upper-inner quadrant of right female 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, 2.4 x 2.2 x 2.2 cm mass which abuts the pectoral muscle, grade 2, invasive ductal carcinoma, ER 90%, PR 1%, Ki-67 20%, HER-2 positive ratio 2.97, T2 N0 M0 stage II A clinical stage  Status post TCH Perjeta 6 cycles started 07/14/2014 completed 10/28/2014 Right lumpectomy 12/02/2014: Residual IDC grade 3; 2.4 cm with DCIS high-grade, posterior margin probably positive for invasive cancer, 1/4 lymph nodes positive with extracapsular extension, T2 N1 stage IIB Adj XRT completed 01/26/15  Current treatment: 1. Adjuvant antiestrogen therapy with anastrozole started January 2017 stopped March 2017letrozole resumed 05/22/2017 2. Herceptin maintenancecompleted May 2017 Echocardiogram 05/20/2015: EF 60-65%  Letrozole toxicities: Hot flashes Patient husband passed away in 02-01-2018  Major depression:Well controlled on Effexor 75 mg daily  Breast cancer surveillance:  1. Annual mammograms9/21/2020: Benign breast density category C Bone density 07/15/2015: Normal, this will need to be repeated 2. breast exam 01/09/2019: Benign  Return to clinic in1 year for follow-up    No orders of the defined types were placed in this encounter.  The patient has a good understanding of the overall plan. she agrees with it. she will call with any problems that may develop before the next visit here.  Nicholas Lose, MD 01/09/2019  Julious Oka Dorshimer, am acting as Education administrator for  Dr. Nicholas Lose.  I have reviewed the above documentation for accuracy and completeness, and I agree with the above.

## 2019-01-09 ENCOUNTER — Other Ambulatory Visit: Payer: Self-pay

## 2019-01-09 ENCOUNTER — Inpatient Hospital Stay: Payer: Medicare Other | Attending: Hematology and Oncology | Admitting: Hematology and Oncology

## 2019-01-09 DIAGNOSIS — Z170 Estrogen receptor positive status [ER+]: Secondary | ICD-10-CM | POA: Diagnosis not present

## 2019-01-09 DIAGNOSIS — N951 Menopausal and female climacteric states: Secondary | ICD-10-CM | POA: Insufficient documentation

## 2019-01-09 DIAGNOSIS — F329 Major depressive disorder, single episode, unspecified: Secondary | ICD-10-CM | POA: Insufficient documentation

## 2019-01-09 DIAGNOSIS — Z79899 Other long term (current) drug therapy: Secondary | ICD-10-CM | POA: Insufficient documentation

## 2019-01-09 DIAGNOSIS — C50211 Malignant neoplasm of upper-inner quadrant of right female breast: Secondary | ICD-10-CM | POA: Diagnosis present

## 2019-01-09 DIAGNOSIS — Z9221 Personal history of antineoplastic chemotherapy: Secondary | ICD-10-CM | POA: Insufficient documentation

## 2019-01-09 DIAGNOSIS — Z79811 Long term (current) use of aromatase inhibitors: Secondary | ICD-10-CM | POA: Insufficient documentation

## 2019-01-09 DIAGNOSIS — Z923 Personal history of irradiation: Secondary | ICD-10-CM | POA: Insufficient documentation

## 2019-01-09 MED ORDER — LETROZOLE 2.5 MG PO TABS: ORAL_TABLET | ORAL | 3 refills | Status: DC

## 2019-01-09 NOTE — Assessment & Plan Note (Signed)
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 Adjuvant antiestrogen therapy with anastrozole started January 2017 stopped March 2017letrozole resumed 05/22/2017 2. Herceptin maintenancecompleted May 2017 Echocardiogram 05/20/2015: EF 60-65%  Letrozole toxicities: Hot flashes Patient husband was diagnosed with hepatocellular carcinoma.  She has been extremely busy being the caregiver and this is exhausted her.  She is in tears and is requesting assistance from our social workers.  Vernie Shanks was able to meet with her today.  Major depression:Well controlled on Effexor 75 mg daily Severe hypertension: I instructed her to make an appointment and see her primary care physician.  Breast cancer surveillance:  1. Annual mammograms9/21/2020: Benign breast density category C Bone density 07/15/2015: Normal, this will need to be repeated 2. breast exam 01/09/2019: Benign  Return to clinic in1 year for follow-up

## 2019-01-10 ENCOUNTER — Telehealth: Payer: Self-pay | Admitting: Hematology and Oncology

## 2019-01-10 NOTE — Telephone Encounter (Signed)
I left a message regarding schedule  

## 2019-01-11 ENCOUNTER — Ambulatory Visit: Payer: Self-pay | Admitting: Hematology and Oncology

## 2019-01-13 ENCOUNTER — Other Ambulatory Visit: Payer: Self-pay

## 2019-01-13 ENCOUNTER — Emergency Department (HOSPITAL_COMMUNITY): Payer: Medicare Other

## 2019-01-13 ENCOUNTER — Emergency Department (HOSPITAL_COMMUNITY)
Admission: EM | Admit: 2019-01-13 | Discharge: 2019-01-13 | Disposition: A | Discharge status: Home / Self Care | Payer: Medicare Other | Attending: Emergency Medicine | Admitting: Emergency Medicine

## 2019-01-13 ENCOUNTER — Encounter (HOSPITAL_COMMUNITY): Payer: Self-pay | Admitting: Emergency Medicine

## 2019-01-13 DIAGNOSIS — Z853 Personal history of malignant neoplasm of breast: Secondary | ICD-10-CM | POA: Insufficient documentation

## 2019-01-13 DIAGNOSIS — S2232XA Fracture of one rib, left side, initial encounter for closed fracture: Secondary | ICD-10-CM | POA: Diagnosis not present

## 2019-01-13 DIAGNOSIS — Z79899 Other long term (current) drug therapy: Secondary | ICD-10-CM | POA: Insufficient documentation

## 2019-01-13 DIAGNOSIS — W1839XA Other fall on same level, initial encounter: Secondary | ICD-10-CM | POA: Diagnosis not present

## 2019-01-13 DIAGNOSIS — W19XXXA Unspecified fall, initial encounter: Secondary | ICD-10-CM

## 2019-01-13 DIAGNOSIS — Y929 Unspecified place or not applicable: Secondary | ICD-10-CM | POA: Insufficient documentation

## 2019-01-13 DIAGNOSIS — S299XXA Unspecified injury of thorax, initial encounter: Secondary | ICD-10-CM | POA: Diagnosis present

## 2019-01-13 DIAGNOSIS — F17200 Nicotine dependence, unspecified, uncomplicated: Secondary | ICD-10-CM | POA: Insufficient documentation

## 2019-01-13 DIAGNOSIS — Y9389 Activity, other specified: Secondary | ICD-10-CM | POA: Diagnosis not present

## 2019-01-13 DIAGNOSIS — Y999 Unspecified external cause status: Secondary | ICD-10-CM | POA: Insufficient documentation

## 2019-01-13 DIAGNOSIS — R1010 Upper abdominal pain, unspecified: Secondary | ICD-10-CM | POA: Diagnosis not present

## 2019-01-13 LAB — CBC WITH DIFFERENTIAL/PLATELET
Abs Immature Granulocytes: 0.02 10*3/uL (ref 0.00–0.07)
Basophils Absolute: 0.1 10*3/uL (ref 0.0–0.1)
Basophils Relative: 1 %
Eosinophils Absolute: 0 10*3/uL (ref 0.0–0.5)
Eosinophils Relative: 1 %
HCT: 43.7 % (ref 36.0–46.0)
Hemoglobin: 15.3 g/dL — ABNORMAL HIGH (ref 12.0–15.0)
Immature Granulocytes: 0 %
Lymphocytes Relative: 13 %
Lymphs Abs: 1 10*3/uL (ref 0.7–4.0)
MCH: 34.9 pg — ABNORMAL HIGH (ref 26.0–34.0)
MCHC: 35 g/dL (ref 30.0–36.0)
MCV: 99.8 fL (ref 80.0–100.0)
Monocytes Absolute: 0.7 10*3/uL (ref 0.1–1.0)
Monocytes Relative: 8 %
Neutro Abs: 6.4 10*3/uL (ref 1.7–7.7)
Neutrophils Relative %: 77 %
Platelets: 294 10*3/uL (ref 150–400)
RBC: 4.38 MIL/uL (ref 3.87–5.11)
RDW: 15.3 % (ref 11.5–15.5)
WBC: 8.2 10*3/uL (ref 4.0–10.5)
nRBC: 0 % (ref 0.0–0.2)

## 2019-01-13 LAB — BASIC METABOLIC PANEL
Anion gap: 14 (ref 5–15)
BUN: 5 mg/dL — ABNORMAL LOW (ref 8–23)
CO2: 23 mmol/L (ref 22–32)
Calcium: 9.2 mg/dL (ref 8.9–10.3)
Chloride: 103 mmol/L (ref 98–111)
Creatinine, Ser: 0.56 mg/dL (ref 0.44–1.00)
GFR calc Af Amer: >60 mL/min (ref >60)
GFR calc non Af Amer: >60 mL/min (ref >60)
Glucose, Bld: 99 mg/dL (ref 70–99)
Potassium: 2.9 mmol/L — ABNORMAL LOW (ref 3.5–5.1)
Sodium: 140 mmol/L (ref 135–145)

## 2019-01-13 MED ORDER — IOHEXOL 300 MG/ML  SOLN
100.0000 mL | Freq: Once | INTRAMUSCULAR | Status: AC | PRN
  Administered 2019-01-13: 100 mL via INTRAVENOUS

## 2019-01-13 MED ORDER — HYDROCODONE-ACETAMINOPHEN 5-325 MG PO TABS: 1.0000 | ORAL_TABLET | Freq: Four times a day (QID) | ORAL | 0 refills | Status: DC | PRN

## 2019-01-13 MED ORDER — ONDANSETRON HCL 4 MG/2ML IJ SOLN
4.0000 mg | Freq: Once | INTRAMUSCULAR | Status: AC
  Administered 2019-01-13: 4 mg via INTRAVENOUS
  Filled 2019-01-13: qty 2

## 2019-01-13 MED ORDER — MORPHINE SULFATE (PF) 4 MG/ML IV SOLN
4.0000 mg | Freq: Once | INTRAVENOUS | Status: AC
  Administered 2019-01-13: 4 mg via INTRAVENOUS
  Filled 2019-01-13: qty 1

## 2019-01-13 MED ORDER — SODIUM CHLORIDE (PF) 0.9 % IJ SOLN
INTRAMUSCULAR | Status: AC
  Administered 2019-01-13: 04:00:00
  Filled 2019-01-13: qty 50

## 2019-01-13 NOTE — ED Triage Notes (Signed)
Pt arriving POV from home following a fall. Pt reports she was putting up Christmas lights and fell onto her left side. Pt now having pain in left rib cage area, chest, and back.

## 2019-01-13 NOTE — ED Notes (Signed)
Pt was verbalized discharge instructions. Pt had no further questions at this time. NAD. 

## 2019-01-13 NOTE — Discharge Instructions (Addendum)
Take hydrocodone as prescribed as needed for pain.

## 2019-01-13 NOTE — ED Provider Notes (Signed)
[facility] DEPT Provider Note   CSN: [id number] Arrival date & time: 01/13/19  0050     History   Chief Complaint Chief Complaint  Patient presents with  . Fall    HPI PATRICIAANN Dalton is a 78 y.o. female.     Patient is a 78 year old female with history of GERD, diverticulitis, arthritis.  She presents today for evaluation of fall.  Patient was outside hanging up Christmas lights when she lost her balance and landed on her left side.  She complains of pain to the left ribs and left upper abdomen and left flank.  She has pain when she breathes and moves.  She denies shortness of breath.  The history is provided by the patient.  Fall This is a new problem. The current episode started 1 to 2 hours ago. The problem occurs constantly. The problem has not changed since onset.Associated symptoms include chest pain and abdominal pain. Exacerbated by: Deep breathing, movement, and palpation. Nothing relieves the symptoms. She has tried nothing for the symptoms.    Past Medical History:  Diagnosis Date  . Allergy   . Arthritis    fingers  . Breast cancer ([place]) 06/20/14   right breast  . Diverticulitis   . GERD (gastroesophageal reflux disease)   . Personal history of chemotherapy   . Personal history of radiation therapy     Patient Active Problem List   Diagnosis Date Noted  . Personal history of antineoplastic chemotherapy 09/28/2017  . Personal history of radiation therapy 09/28/2017  . LBBB (left bundle branch block) 09/28/2017  . Alcohol use disorder, severe, dependence ([place]) 05/23/2017  . AR (allergic rhinitis) 07/09/2015  . Postmenopausal HRT (hormone replacement therapy) 07/09/2015  . Tobacco dependence 07/09/2015  . Major depression 05/26/2015  . Gastritis determined by endoscopy 01/28/2015  . Tobacco use disorder 11/18/2014  . Diarrhea 10/14/2014  . Dehydration 10/14/2014  . Neoplastic malignant related fatigue 10/06/2014  .  Nausea without vomiting 10/06/2014  . Constipation 10/06/2014  . Orthostatic hypotension 10/06/2014  . At risk for impaired cardiac function 07/04/2014  . Breast cancer of upper-inner quadrant of right female breast ([place]) 07/03/2014    Past Surgical History:  Procedure Laterality Date  . ABDOMINAL HYSTERECTOMY  ~ 40 years ago  . APPENDECTOMY    . BREAST BIOPSY Left 10/6/210   no malignancy,extensive stromal fibrosis  . BREAST BIOPSY Right 06/20/14   invasive ductal ca,dcis   . BREAST LUMPECTOMY Right   . BREAST LUMPECTOMY WITH RADIOACTIVE SEED AND SENTINEL LYMPH NODE BIOPSY Right 12/02/2014   Procedure: RIGHT BREAST LUMPECTOMY WITH RADIOACTIVE SEED LOCALIZATION AND SENTINEL LYMPH NODE BIOPSY;  Surgeon: [name], MD;  Location: [facility];  Service: General;  Laterality: Right;  . COLON SURGERY  10/13/08   cecum polyp=adenomatous ,no high grade dysplasia or invasive malignancy  . HAND SURGERY     RIGHT  . KNEE ARTHROSCOPY     RT  . PORTACATH PLACEMENT Left 07/11/2014   Procedure: INSERTION PORT-A-CATH;  Surgeon: [name], MD;  Location: [facility];  Service: General;  Laterality: Left;  . TONSILLECTOMY       OB History   No obstetric history on file.      Home Medications    Prior to Admission medications   Medication Sig Start Date End Date Taking? Authorizing Provider  b complex vitamins tablet Take 1 tablet by mouth daily.    [provider]  Cholecalciferol (D3 ADULT PO) Take by  mouth daily.    [provider]  letrozole (FEMARA) 2.5 MG tablet TAKE 1 TABLET(2.5 MG) BY MOUTH DAILY 01/09/19   Nicholas Lose, MD  mirtazapine (REMERON SOL-TAB) 30 MG disintegrating tablet  07/22/17   [provider]  venlafaxine XR (EFFEXOR-XR) 75 MG 24 hr capsule TAKE 1 CAPSULE(75 MG) BY MOUTH DAILY 02/12/18   Nicholas Lose, MD  vitamin E 400 UNIT capsule Take 400 Units by mouth daily.    [provider]    Family History Family  History  Problem Relation Age of Onset  . CVA Mother     Social History Social History   Tobacco Use  . Smoking status: Current Every Day Smoker    Packs/day: 0.50    Years: 56.00    Pack years: 28.00  . Smokeless tobacco: Never Used  Substance Use Topics  . Alcohol use: Yes    Comment: OCCASIONAL  . Drug use: No     Allergies   Patient has no known allergies.   Review of Systems Review of Systems  Cardiovascular: Positive for chest pain.  Gastrointestinal: Positive for abdominal pain.  All other systems reviewed and are negative.    Physical Exam Updated Vital Signs BP (!) 178/109 (BP Location: Left Arm)   Pulse (!) 111   Temp 98.7 F (37.1 C) (Oral)   Resp 18   Ht 5\' 5"  (1.651 m)   Wt 52.2 kg   SpO2 93%   BMI 19.14 kg/m   Physical Exam Vitals signs and nursing note reviewed.  Constitutional:      General: She is not in acute distress.    Appearance: She is well-developed. She is not diaphoretic.  HENT:     Head: Normocephalic and atraumatic.  Neck:     Musculoskeletal: Normal range of motion and neck supple.  Cardiovascular:     Rate and Rhythm: Normal rate and regular rhythm.     Heart sounds: No murmur. No friction rub. No gallop.   Pulmonary:     Effort: Pulmonary effort is normal. No respiratory distress.     Breath sounds: Normal breath sounds. No wheezing.  Abdominal:     General: Bowel sounds are normal. There is no distension.     Palpations: Abdomen is soft.     Tenderness: There is no abdominal tenderness.  Musculoskeletal: Normal range of motion.  Skin:    General: Skin is warm and dry.  Neurological:     Mental Status: She is alert and oriented to person, place, and time.      ED Treatments / Results  Labs (all labs ordered are listed, but only abnormal results are displayed) Labs Reviewed  BASIC METABOLIC PANEL  CBC WITH DIFFERENTIAL/PLATELET    EKG None  Radiology Dg Chest Port 1 View  Result Date: 01/13/2019  CLINICAL DATA:  Fall onto left side. Left rib pain. EXAM: PORTABLE CHEST 1 VIEW COMPARISON:  Radiograph 07/11/2014 FINDINGS: Chronic hyperinflation and interstitial coarsening. Normal heart size and mediastinal contours. No acute airspace disease or pleural effusion. Lucency paralleling the lateral chest wall felt to represent a skin fold rather than pneumothorax, as lung markings extend beyond. Postsurgical change in the right breast. Bones diffusely under mineralized. No acute rib fractures on this single AP chest. IMPRESSION: 1. No acute abnormality. 2. Chronic hyperinflation and interstitial coarsening. 3. Lucency paralleling the lateral chest wall is felt to represent a skin fold rather than pneumothorax. Electronically Signed   By: Keith Rake M.D.   On:  01/13/2019 01:48    Procedures Procedures (including critical care time)  Medications Ordered in ED Medications  morphine 4 MG/ML injection 4 mg (has no administration in time range)  ondansetron (ZOFRAN) injection 4 mg (has no administration in time range)     Initial Impression / Assessment and Plan / ED Course  I have reviewed the triage vital signs and the nursing notes.  Pertinent labs & imaging results that were available during my care of the patient were reviewed by me and considered in my medical decision making (see chart for details)  Patient presenting with complaints of pain to the left lateral rib cage and upper abdomen after a fall while hanging Christmas ornaments.  Patient is exquisitely tender over the left lateral ribs.  Patient CT scan shows a left eighth rib fracture with no evidence for pneumothorax or pulmonary contusion.  There is no evidence for injury to other organs including the spleen or kidney.  At this point, patient feeling better after medications given in the ER.  She will be discharged with pain medicine and follow-up as needed.  Final Clinical Impressions(s) / ED Diagnoses   Final diagnoses:   Fall    ED Discharge Orders    None       Veryl Speak, MD 01/13/19 [phone]

## 2019-02-23 ENCOUNTER — Other Ambulatory Visit: Payer: Self-pay | Admitting: Hematology and Oncology

## 2019-02-23 DIAGNOSIS — F332 Major depressive disorder, recurrent severe without psychotic features: Secondary | ICD-10-CM

## 2019-02-23 DIAGNOSIS — C50211 Malignant neoplasm of upper-inner quadrant of right female breast: Secondary | ICD-10-CM

## 2019-03-31 ENCOUNTER — Ambulatory Visit: Payer: Medicare Other | Attending: Internal Medicine

## 2019-03-31 DIAGNOSIS — Z23 Encounter for immunization: Secondary | ICD-10-CM

## 2019-03-31 NOTE — Progress Notes (Signed)
   Covid-19 Vaccination Clinic  Name:  Marilyn Dalton    MRN: [id number] DOB: August 19, 1940  03/31/2019  [name] was observed post Covid-19 immunization for 15 minutes without incidence. She was provided with Vaccine Information Sheet and instruction to access the V-Safe system.   [name] was instructed to call 911 with any severe reactions post vaccine: [name] Difficulty breathing  . Swelling of your face and throat  . A fast heartbeat  . A bad rash all over your body  . Dizziness and weakness    Immunizations Administered    Name Date Dose VIS Date Route   Pfizer COVID-19 Vaccine 03/31/2019 12:09 PM 0.3 mL 02/01/2019 Intramuscular   Manufacturer: [place]   Lot: [id number]   [place]: [id number]

## 2019-04-24 ENCOUNTER — Ambulatory Visit: Payer: Medicare Other

## 2019-04-24 ENCOUNTER — Ambulatory Visit: Payer: Medicare Other | Attending: Internal Medicine

## 2019-04-24 DIAGNOSIS — Z23 Encounter for immunization: Secondary | ICD-10-CM | POA: Insufficient documentation

## 2019-04-24 NOTE — Progress Notes (Signed)
   Covid-19 Vaccination Clinic  Name:  Marilyn Dalton    MRN: [id number] DOB: 03/12/1940  04/24/2019  Marilyn Dalton was observed post Covid-19 immunization for 15 minutes without incident. She was provided with Vaccine Information Sheet and instruction to access the V-Safe system.   Marilyn Dalton was instructed to call 911 with any severe reactions post vaccine: [name] Difficulty breathing  . Swelling of face and throat  . A fast heartbeat  . A bad rash all over body  . Dizziness and weakness   Immunizations Administered    Name Date Dose VIS Date Route   Pfizer COVID-19 Vaccine 04/24/2019  9:32 AM 0.3 mL 02/01/2019 Intramuscular   Manufacturer: [place]   Lot: [id number]   [place]: [id number]

## 2019-07-18 ENCOUNTER — Other Ambulatory Visit: Payer: Self-pay | Admitting: Hematology and Oncology

## 2019-07-18 DIAGNOSIS — F332 Major depressive disorder, recurrent severe without psychotic features: Secondary | ICD-10-CM

## 2019-07-18 DIAGNOSIS — Z170 Estrogen receptor positive status [ER+]: Secondary | ICD-10-CM

## 2019-08-27 ENCOUNTER — Encounter: Payer: Self-pay | Admitting: Family Medicine

## 2019-08-27 ENCOUNTER — Ambulatory Visit (INDEPENDENT_AMBULATORY_CARE_PROVIDER_SITE_OTHER): Payer: Medicare Other | Admitting: Family Medicine

## 2019-08-27 ENCOUNTER — Other Ambulatory Visit: Payer: Self-pay

## 2019-08-27 VITALS — BP 140/91 | HR 101 | Temp 96.5°F | Ht 64.75 in | Wt 114.2 lb

## 2019-08-27 DIAGNOSIS — G4700 Insomnia, unspecified: Secondary | ICD-10-CM | POA: Diagnosis not present

## 2019-08-27 DIAGNOSIS — Z13820 Encounter for screening for osteoporosis: Secondary | ICD-10-CM

## 2019-08-27 DIAGNOSIS — F332 Major depressive disorder, recurrent severe without psychotic features: Secondary | ICD-10-CM

## 2019-08-27 DIAGNOSIS — Z170 Estrogen receptor positive status [ER+]: Secondary | ICD-10-CM

## 2019-08-27 DIAGNOSIS — Z13220 Encounter for screening for lipoid disorders: Secondary | ICD-10-CM

## 2019-08-27 DIAGNOSIS — C50211 Malignant neoplasm of upper-inner quadrant of right female breast: Secondary | ICD-10-CM | POA: Diagnosis not present

## 2019-08-27 DIAGNOSIS — F1020 Alcohol dependence, uncomplicated: Secondary | ICD-10-CM

## 2019-08-27 NOTE — Patient Instructions (Addendum)
[place]: [email or web address]  [email or web address]  [facility] [email or web address]  [facility] [email or web address]  [email or web address]   Melatonin 1mg  to 3mg  nightly  Natrol (controlled/delayed release)

## 2019-08-27 NOTE — Progress Notes (Signed)
Marilyn Dalton is a 79 y.o. female  Chief Complaint  Patient presents with  . Establish Care    New patient here for possible dementia. POA here with pt    HPI: Marilyn Dalton") is a 79 y.o. female new to our office to establish care. She is accompanied by her medical POA Amy. Daughter April lives in [place].  Her husband passed away in 03-15-2019 from liver cancer and daughter lives in [place]. She did group grief counseling but did not find it helpful. She is open to individual counseling. Pt also admits to drinking 5-6 alcoholic drinks per day since her husband passed away.   Pt had fall recently in 12/2018 - was seen in ER  She has a h/o breast cancer and follows with oncology Dr. Lindi Adie at [facility]   Past Medical History:  Diagnosis Date  . Allergy   . Arthritis    fingers  . Breast cancer ([place]) 06/20/14   right breast  . Diverticulitis   . GERD (gastroesophageal reflux disease)   . Personal history of chemotherapy   . Personal history of radiation therapy     Past Surgical History:  Procedure Laterality Date  . ABDOMINAL HYSTERECTOMY  ~ 40 years ago  . APPENDECTOMY    . BREAST BIOPSY Left 10/6/210   no malignancy,extensive stromal fibrosis  . BREAST BIOPSY Right 06/20/14   invasive ductal ca,dcis   . BREAST LUMPECTOMY Right   . BREAST LUMPECTOMY WITH RADIOACTIVE SEED AND SENTINEL LYMPH NODE BIOPSY Right 12/02/2014   Procedure: RIGHT BREAST LUMPECTOMY WITH RADIOACTIVE SEED LOCALIZATION AND SENTINEL LYMPH NODE BIOPSY;  Surgeon: [name], MD;  Location: [facility];  Service: General;  Laterality: Right;  . COLON SURGERY  10/13/08   cecum polyp=adenomatous ,no high grade dysplasia or invasive malignancy  . HAND SURGERY     RIGHT  . KNEE ARTHROSCOPY     RT  . PORTACATH PLACEMENT Left 07/11/2014   Procedure: INSERTION PORT-A-CATH;  Surgeon: [name], MD;  [facility];  Service: General;  Laterality: Left;    . TONSILLECTOMY      Social History   Socioeconomic History  . Marital status: Married    Spouse name: Not on file  . Number of children: Not on file  . Years of education: Not on file  . Highest education level: Not on file  Occupational History  . Not on file  Tobacco Use  . Smoking status: Current Every Day Smoker    Packs/day: 0.50    Years: 56.00    Pack years: 28.00  . Smokeless tobacco: Never Used  Substance and Sexual Activity  . Alcohol use: Yes    Comment: OCCASIONAL  . Drug use: No  . Sexual activity: Not on file  Other Topics Concern  . Not on file  Social History Narrative  . Not on file   Social Determinants of Health   Financial Resource Strain:   . Difficulty of Paying Living Expenses:   Food Insecurity:   . Worried About Charity fundraiser in the Last Year:   . Arboriculturist in the Last Year:   Transportation Needs:   . Film/video editor (Medical):   [name] Lack of Transportation (Non-Medical):   Physical Activity:   . Days of Exercise per Week:   . Minutes of Exercise per Session:   Stress:   . Feeling of Stress :   Social Connections:   .  Frequency of Communication with Friends and Family:   . Frequency of Social Gatherings with Friends and Family:   . Attends Religious Services:   . Active Member of Clubs or Organizations:   . Attends Archivist Meetings:   [name] Marital Status:   Intimate Partner Violence:   . Fear of Current or Ex-Partner:   . Emotionally Abused:   [name] Physically Abused:   . Sexually Abused:     Family History  Problem Relation Age of Onset  . CVA Mother      Immunization History  Administered Date(s) Administered  . PFIZER SARS-COV-2 Vaccination 03/31/2019, 04/24/2019    Outpatient Encounter Medications as of 08/27/2019  Medication Sig  . b complex vitamins tablet Take 1 tablet by mouth daily.  . Cholecalciferol (D3 ADULT PO) Take by mouth daily.  [name] letrozole (FEMARA) 2.5 MG tablet TAKE 1 TABLET(2.5  MG) BY MOUTH DAILY  . venlafaxine XR (EFFEXOR-XR) 75 MG 24 hr capsule TAKE 1 CAPSULE(75 MG) BY MOUTH DAILY  . vitamin E 400 UNIT capsule Take 400 Units by mouth daily.  [name] HYDROcodone-acetaminophen (NORCO) 5-325 MG tablet Take 1-2 tablets by mouth every 6 (six) hours as needed. (Patient not taking: Reported on 08/27/2019)  . mirtazapine (REMERON SOL-TAB) 30 MG disintegrating tablet  (Patient not taking: Reported on 08/27/2019)   No facility-administered encounter medications on file as of 08/27/2019.     ROS: Pertinent positives and negatives noted in HPI. Remainder of ROS non-contributory    No Known Allergies  BP (!) 140/91 (BP Location: Left Arm, Patient Position: Sitting, Cuff Size: Normal)   Pulse (!) 101   Temp (!) 96.5 F (35.8 C) (Temporal)   Ht 5' 4.75" (1.645 m)   Wt 114 lb 3.2 oz (51.8 kg)   SpO2 97%   BMI 19.15 kg/m   Physical Exam Constitutional:      Appearance: Normal appearance.  Cardiovascular:     Rate and Rhythm: Regular rhythm. Tachycardia present.     Pulses: Normal pulses.     Heart sounds: Normal heart sounds.  Pulmonary:     Effort: Pulmonary effort is normal. No respiratory distress.     Breath sounds: No wheezing or rhonchi.  Musculoskeletal:     Right lower leg: No edema.     Left lower leg: No edema.  Skin:    General: Skin is warm and dry.  Neurological:     Mental Status: She is alert and oriented to person, place, and time.  Psychiatric:        Mood and Affect: Mood normal.        Behavior: Behavior normal.      A/P:  1. Screening for osteoporosis - VITAMIN D 25 Hydroxy (Vit-D Deficiency, Fractures); Future - DG Bone Density; Future  2. Malignant neoplasm of upper-inner quadrant of right breast in female, estrogen receptor positive ([place]) - cont annual f/u with oncology and cont current med letrozole 2.5mg  daily - Comprehensive metabolic panel; Future - CBC; Future  3. Insomnia, unspecified type - trial of melatonin 1mg  - 3mg   qHS  4. Screening for lipid disorders - Lipid panel; Future  5. Severe episode of recurrent major depressive disorder, without psychotic features ([place]) 6. Alcohol use disorder - PHQ-9 = 21, GAD-7 = 13 - on effexor 75mg  daily x 5 years - pt is not comfortable changing med or increasing dose despite my encouraging her to do so. She will think about this. - pt is agreeable to counseling and info  included in AVS   This visit occurred during the SARS-CoV-2 public health emergency.  Safety protocols were in place, including screening questions prior to the visit, additional usage of staff PPE, and extensive cleaning of exam room while observing appropriate contact time as indicated for disinfecting solutions.

## 2019-09-05 ENCOUNTER — Other Ambulatory Visit (INDEPENDENT_AMBULATORY_CARE_PROVIDER_SITE_OTHER): Payer: Medicare Other

## 2019-09-05 ENCOUNTER — Other Ambulatory Visit: Payer: Self-pay

## 2019-09-05 DIAGNOSIS — Z170 Estrogen receptor positive status [ER+]: Secondary | ICD-10-CM

## 2019-09-05 DIAGNOSIS — Z13220 Encounter for screening for lipoid disorders: Secondary | ICD-10-CM

## 2019-09-05 DIAGNOSIS — Z13820 Encounter for screening for osteoporosis: Secondary | ICD-10-CM | POA: Diagnosis not present

## 2019-09-05 DIAGNOSIS — C50211 Malignant neoplasm of upper-inner quadrant of right female breast: Secondary | ICD-10-CM | POA: Diagnosis not present

## 2019-09-05 LAB — COMPREHENSIVE METABOLIC PANEL
ALT: 19 U/L (ref 0–35)
AST: 36 U/L (ref 0–37)
Albumin: 4.2 g/dL (ref 3.5–5.2)
Alkaline Phosphatase: 96 U/L (ref 39–117)
BUN: 7 mg/dL (ref 6–23)
CO2: 31 mEq/L (ref 19–32)
Calcium: 9.6 mg/dL (ref 8.4–10.5)
Chloride: 101 mEq/L (ref 96–112)
Creatinine, Ser: 0.63 mg/dL (ref 0.40–1.20)
GFR: 91.23 mL/min (ref >60.00)
Glucose, Bld: 98 mg/dL (ref 70–99)
Potassium: 4 mEq/L (ref 3.5–5.1)
Sodium: 141 mEq/L (ref 135–145)
Total Bilirubin: 0.8 mg/dL (ref 0.2–1.2)
Total Protein: 6.8 g/dL (ref 6.0–8.3)

## 2019-09-05 LAB — LIPID PANEL
Cholesterol: 210 mg/dL — ABNORMAL HIGH (ref 0–200)
HDL: 85.5 mg/dL (ref >39.00)
LDL Cholesterol: 106 mg/dL — ABNORMAL HIGH (ref 0–99)
NonHDL: 124.01
Total CHOL/HDL Ratio: 2
Triglycerides: 91 mg/dL (ref 0.0–149.0)
VLDL: 18.2 mg/dL (ref 0.0–40.0)

## 2019-09-05 LAB — VITAMIN D 25 HYDROXY (VIT D DEFICIENCY, FRACTURES): VITD: 41.54 ng/mL (ref 30.00–100.00)

## 2019-09-05 LAB — CBC
HCT: 46.3 % — ABNORMAL HIGH (ref 36.0–46.0)
Hemoglobin: 15.6 g/dL — ABNORMAL HIGH (ref 12.0–15.0)
MCHC: 33.6 g/dL (ref 30.0–36.0)
MCV: 104 fl — ABNORMAL HIGH (ref 78.0–100.0)
Platelets: 335 10*3/uL (ref 150.0–400.0)
RBC: 4.45 Mil/uL (ref 3.87–5.11)
RDW: 16.8 % — ABNORMAL HIGH (ref 11.5–15.5)
WBC: 6.2 10*3/uL (ref 4.0–10.5)

## 2019-09-06 ENCOUNTER — Encounter: Payer: Self-pay | Admitting: Family Medicine

## 2019-09-24 NOTE — Telephone Encounter (Signed)
Please see message and advise.  Thank you. ° °

## 2019-10-15 ENCOUNTER — Telehealth: Payer: Self-pay | Admitting: Family Medicine

## 2019-10-15 ENCOUNTER — Telehealth: Payer: Self-pay

## 2019-10-15 NOTE — Telephone Encounter (Signed)
Dr. Loletha Grayer this is an Nic[place] I spoke with Pt's POA an she said pt is severely depressed and she has turned to alcoholism. She has stopped reaching out and she is worried about her an fears for her life. She said she doesn't need any help on zoom but she needs in person help. She states pt is incoherent after 2pm everyday. She was going to try to take her to the ER but she refuses but not wants help and has agreed to come see you. Pt is scheduled for Thursday Morning at 8am with you 10/17/2019.

## 2019-10-15 NOTE — Telephone Encounter (Signed)
Error

## 2019-10-16 ENCOUNTER — Other Ambulatory Visit: Payer: Self-pay

## 2019-10-17 ENCOUNTER — Ambulatory Visit (INDEPENDENT_AMBULATORY_CARE_PROVIDER_SITE_OTHER): Payer: Medicare Other | Admitting: Family Medicine

## 2019-10-17 ENCOUNTER — Encounter: Payer: Self-pay | Admitting: Family Medicine

## 2019-10-17 VITALS — BP 132/82 | HR 101 | Temp 97.7°F | Ht 64.75 in | Wt 112.4 lb

## 2019-10-17 DIAGNOSIS — F1020 Alcohol dependence, uncomplicated: Secondary | ICD-10-CM

## 2019-10-17 DIAGNOSIS — F332 Major depressive disorder, recurrent severe without psychotic features: Secondary | ICD-10-CM | POA: Diagnosis not present

## 2019-10-17 MED ORDER — VENLAFAXINE HCL ER 150 MG PO CP24: 150.0000 mg | ORAL_CAPSULE | Freq: Every day | ORAL | 1 refills | Status: DC

## 2019-10-17 NOTE — Progress Notes (Signed)
Marilyn Dalton is a 79 y.o. female  Chief Complaint  Patient presents with  . Follow-up    medication changes with the Effexor, pt is over using alchol and is depressed. pt fasting    HPI: Marilyn COTHAM "[place] Ropes" is a 79 y.o. female here with her medical POA Amy to address concerns about depression and alcohol abuse. This has increased significantly since her husband became sick and passed away in 2019-03-04 from liver cancer. Pt is not eating well, she is drinking too much. She copes with her anxiety and depression by consuming alcohol. She drinks daily, starting in AM, to the point of passing out. She has had a few falls recently. She is on effexor 75mg  daily and has been on this for years. No side effects.  Denies SI.  She complains of sore throat that started 5 days ago, improved in the past 2 days. Runny nose after crying. No cough, SOB, chest pain, myalgias, GI symptoms, loss of taste or smell. ? Subjective fever 5 days ago. Pt has covid vaccine in 05/2019 and interacts with only Amy who is fully vaccinated.   Past Medical History:  Diagnosis Date  . Allergy   . Arthritis    fingers  . Breast cancer ([place]) 06/20/14   right breast  . Diverticulitis   . GERD (gastroesophageal reflux disease)   . Personal history of chemotherapy   . Personal history of radiation therapy     Past Surgical History:  Procedure Laterality Date  . ABDOMINAL HYSTERECTOMY  ~ 40 years ago  . APPENDECTOMY    . BREAST BIOPSY Left 10/6/210   no malignancy,extensive stromal fibrosis  . BREAST BIOPSY Right 06/20/14   invasive ductal ca,dcis   . BREAST LUMPECTOMY Right   . BREAST LUMPECTOMY WITH RADIOACTIVE SEED AND SENTINEL LYMPH NODE BIOPSY Right 12/02/2014   Procedure: RIGHT BREAST LUMPECTOMY WITH RADIOACTIVE SEED LOCALIZATION AND SENTINEL LYMPH NODE BIOPSY;  Surgeon: [name], MD;  Location: [facility];  Service: General;  Laterality: Right;  . COLON SURGERY  10/13/08   cecum  polyp=adenomatous ,no high grade dysplasia or invasive malignancy  . HAND SURGERY     RIGHT  . KNEE ARTHROSCOPY     RT  . PORTACATH PLACEMENT Left 07/11/2014   Procedure: INSERTION PORT-A-CATH;  Surgeon: [name], MD;  Location[facility];  Service: General;  Laterality: Left;  . TONSILLECTOMY      Social History   Socioeconomic History  . Marital status: Married    Spouse name: Not on file  . Number of children: Not on file  . Years of education: Not on file  . Highest education level: Not on file  Occupational History  . Not on file  Tobacco Use  . Smoking status: Current Every Day Smoker    Packs/day: 0.50    Years: 56.00    Pack years: 28.00  . Smokeless tobacco: Never Used  Substance and Sexual Activity  . Alcohol use: Yes    Comment: OCCASIONAL  . Drug use: No  . Sexual activity: Not on file  Other Topics Concern  . Not on file  Social History Narrative  . Not on file   Social Determinants of Health   Financial Resource Strain:   . Difficulty of Paying Living Expenses: Not on file  Food Insecurity:   . Worried About Charity fundraiser in the Last Year: Not on file  . Ran Out of Food in the Last Year: Not  on file  Transportation Needs:   . Lack of Transportation (Medical): Not on file  . Lack of Transportation (Non-Medical): Not on file  Physical Activity:   . Days of Exercise per Week: Not on file  . Minutes of Exercise per Session: Not on file  Stress:   . Feeling of Stress : Not on file  Social Connections:   . Frequency of Communication with Friends and Family: Not on file  . Frequency of Social Gatherings with Friends and Family: Not on file  . Attends Religious Services: Not on file  . Active Member of Clubs or Organizations: Not on file  . Attends Archivist Meetings: Not on file  . Marital Status: Not on file  Intimate Partner Violence:   . Fear of Current or Ex-Partner: Not on file  . Emotionally Abused: Not on file  .  Physically Abused: Not on file  . Sexually Abused: Not on file    Family History  Problem Relation Age of Onset  . CVA Mother      Immunization History  Administered Date(s) Administered  . PFIZER SARS-COV-2 Vaccination 03/31/2019, 04/24/2019    Outpatient Encounter Medications as of 10/17/2019  Medication Sig  . b complex vitamins tablet Take 1 tablet by mouth daily.  . Cholecalciferol (D3 ADULT PO) Take by mouth daily.  [name] letrozole (FEMARA) 2.5 MG tablet TAKE 1 TABLET(2.5 MG) BY MOUTH DAILY  . venlafaxine XR (EFFEXOR-XR) 75 MG 24 hr capsule TAKE 1 CAPSULE(75 MG) BY MOUTH DAILY  . vitamin E 400 UNIT capsule Take 400 Units by mouth daily.   No facility-administered encounter medications on file as of 10/17/2019.     ROS: Pertinent positives and negatives noted in HPI. Remainder of ROS non-contributory    No Known Allergies  BP 132/82   Pulse (!) 101   Temp 97.7 F (36.5 C) (Temporal)   Ht 5' 4.75" (1.645 m)   Wt 112 lb 6.4 oz (51 kg)   SpO2 93%   BMI 18.85 kg/m   Physical Exam Constitutional:      General: She is not in acute distress. HENT:     Right Ear: Tympanic membrane and ear canal normal.     Left Ear: Tympanic membrane and ear canal normal.     Nose: Nose normal. No congestion or rhinorrhea.     Mouth/Throat:     Mouth: Mucous membranes are moist.     Pharynx: No oropharyngeal exudate or posterior oropharyngeal erythema.  Cardiovascular:     Rate and Rhythm: Tachycardia present.  Pulmonary:     Effort: No respiratory distress.  Musculoskeletal:     Right lower leg: No edema.     Left lower leg: No edema.  Lymphadenopathy:     Cervical: No cervical adenopathy.  Neurological:     General: No focal deficit present.     Mental Status: She is alert and oriented to person, place, and time.      A/P:  1. Severe episode of recurrent major depressive disorder, without psychotic features ([place]) Increase: - venlafaxine XR (EFFEXOR-XR) 150 MG 24 hr  capsule; Take 1 capsule (150 mg total) by mouth daily with breakfast.  Dispense: 90 capsule; Refill: 1 - from 75mg  to 150mg   2. Alcohol use disorder, severe, dependence ([place]) - pt drinks daily to the point of passing out, multiple falls in the past few weeks. She admits this is very concerning and that she needs help. Pt states she wants to "find herself" again.  -  symptoms significantly worsened when husband passes away in 03/06/19 and pt has strained relationship with daughter who lives in [place] - Amy, pts neighbor and medical POA, checks on patient at least once per day - pt is agreeable to inpatient substance abuse treatment and counseling and list of resources provided today  I spent 30 min with the patient today obtaining HPI, discussing symptoms, treatment options, etc.  This visit occurred during the SARS-CoV-2 public health emergency.  Safety protocols were in place, including screening questions prior to the visit, additional usage of staff PPE, and extensive cleaning of exam room while observing appropriate contact time as indicated for disinfecting solutions.

## 2019-10-17 NOTE — Patient Instructions (Addendum)
   [email or web address]  [email or web address]  [email or web address]-[email or web address]  [email or web address]  [email or web address]://[email or web address]/addiction-blog/drug-[email or web address]-rehab-centers[place]/

## 2019-10-22 ENCOUNTER — Telehealth: Payer: Self-pay | Admitting: Family Medicine

## 2019-10-22 NOTE — Telephone Encounter (Signed)
Left message for patient to schedule Annual Wellness Visit.  Please schedule with Nurse Health Advisor Martha Stanley, RN at [facility]  °

## 2019-10-23 ENCOUNTER — Other Ambulatory Visit: Payer: Self-pay | Admitting: Hematology and Oncology

## 2019-10-23 DIAGNOSIS — F332 Major depressive disorder, recurrent severe without psychotic features: Secondary | ICD-10-CM

## 2019-10-23 DIAGNOSIS — Z170 Estrogen receptor positive status [ER+]: Secondary | ICD-10-CM

## 2019-11-18 ENCOUNTER — Other Ambulatory Visit: Payer: Self-pay

## 2019-11-19 ENCOUNTER — Encounter: Payer: Self-pay | Admitting: Family Medicine

## 2019-11-19 ENCOUNTER — Ambulatory Visit (INDEPENDENT_AMBULATORY_CARE_PROVIDER_SITE_OTHER): Payer: Medicare Other | Admitting: Family Medicine

## 2019-11-19 VITALS — BP 126/80 | HR 65 | Temp 97.3°F | Ht 64.5 in | Wt 112.0 lb

## 2019-11-19 DIAGNOSIS — Z23 Encounter for immunization: Secondary | ICD-10-CM

## 2019-11-19 DIAGNOSIS — M542 Cervicalgia: Secondary | ICD-10-CM | POA: Diagnosis not present

## 2019-11-19 MED ORDER — NAPROXEN 500 MG PO TABS: 500.0000 mg | ORAL_TABLET | Freq: Two times a day (BID) | ORAL | 0 refills | Status: DC

## 2019-11-19 MED ORDER — TIZANIDINE HCL 4 MG PO CAPS: 4.0000 mg | ORAL_CAPSULE | Freq: Every evening | ORAL | 0 refills | Status: DC | PRN

## 2019-11-19 NOTE — Patient Instructions (Signed)
Heating pad 2-3x/day, 15-20 min on then off After heating pad, do stretching exercises - see below Take naproxen 500mg  1 tab twice per day with food x 5-7 days Take zanaflex 1 tab at bedtime x 3-4 nights    Neck Exercises Ask your health care provider which exercises are safe for you. Do exercises exactly as told by your health care provider and adjust them as directed. It is normal to feel mild stretching, pulling, tightness, or discomfort as you do these exercises. Stop right away if you feel sudden pain or your pain gets worse. Do not begin these exercises until told by your health care provider. Neck exercises can be important for many reasons. They can improve strength and maintain flexibility in your neck, which will help your upper back and prevent neck pain. Stretching exercises Rotation neck stretching  1. Sit in a chair or stand up. 2. Place your feet flat on the floor, shoulder width apart. 3. Slowly turn your head (rotate) to the right until a slight stretch is felt. Turn it all the way to the right so you can look over your right shoulder. Do not tilt or tip your head. 4. Hold this position for 10-30 seconds. 5. Slowly turn your head (rotate) to the left until a slight stretch is felt. Turn it all the way to the left so you can look over your left shoulder. Do not tilt or tip your head. 6. Hold this position for 10-30 seconds. Repeat __________ times. Complete this exercise __________ times a day. Neck retraction 1. Sit in a sturdy chair or stand up. 2. Look straight ahead. Do not bend your neck. 3. Use your fingers to push your chin backward (retraction). Do not bend your neck for this movement. Continue to face straight ahead. If you are doing the exercise properly, you will feel a slight sensation in your throat and a stretch at the back of your neck. 4. Hold the stretch for 1-2 seconds. Repeat __________ times. Complete this exercise __________ times a day. Strengthening  exercises Neck press 1. Lie on your back on a firm bed or on the floor with a pillow under your head. 2. Use your neck muscles to push your head down on the pillow and straighten your spine. 3. Hold the position as well as you can. Keep your head facing up (in a neutral position) and your chin tucked. 4. Slowly count to 5 while holding this position. Repeat __________ times. Complete this exercise __________ times a day. Isometrics These are exercises in which you strengthen the muscles in your neck while keeping your neck still (isometrics). 1. Sit in a supportive chair and place your hand on your forehead. 2. Keep your head and face facing straight ahead. Do not flex or extend your neck while doing isometrics. 3. Push forward with your head and neck while pushing back with your hand. Hold for 10 seconds. 4. Do the sequence again, this time putting your hand against the back of your head. Use your head and neck to push backward against the hand pressure. 5. Finally, do the same exercise on either side of your head, pushing sideways against the pressure of your hand. Repeat __________ times. Complete this exercise __________ times a day. Prone head lifts 1. Lie face-down (prone position), resting on your elbows so that your chest and upper back are raised. 2. Start with your head facing downward, near your chest. Position your chin either on or near your chest. 3. Slowly  lift your head upward. Lift until you are looking straight ahead. Then continue lifting your head as far back as you can comfortably stretch. 4. Hold your head up for 5 seconds. Then slowly lower it to your starting position. Repeat __________ times. Complete this exercise __________ times a day. Supine head lifts 1. Lie on your back (supine position), bending your knees to point to the ceiling and keeping your feet flat on the floor. 2. Lift your head slowly off the floor, raising your chin toward your chest. 3. Hold for 5  seconds. Repeat __________ times. Complete this exercise __________ times a day. Scapular retraction 1. Stand with your arms at your sides. Look straight ahead. 2. Slowly pull both shoulders (scapulae) backward and downward (retraction) until you feel a stretch between your shoulder blades in your upper back. 3. Hold for 10-30 seconds. 4. Relax and repeat. Repeat __________ times. Complete this exercise __________ times a day. Contact a health care provider if:  Your neck pain or discomfort gets much worse when you do an exercise.  Your neck pain or discomfort does not improve within 2 hours after you exercise. If you have any of these problems, stop exercising right away. Do not do the exercises again unless your health care provider says that you can. Get help right away if:  You develop sudden, severe neck pain. If this happens, stop exercising right away. Do not do the exercises again unless your health care provider says that you can. This information is not intended to replace advice given to you by your health care provider. Make sure you discuss any questions you have with your health care provider. Document Revised: 12/06/2017 Document Reviewed: 12/06/2017 Elsevier Patient Education  [employer].

## 2019-11-19 NOTE — Progress Notes (Signed)
Marilyn Dalton is a 79 y.o. female  Chief Complaint  Patient presents with  . Acute Visit    neck pain x 2 weeks.  no meds taken for the pain, wans flu shot today    HPI: Marilyn Dalton is a 79 y.o. female who complains of 2 week h/o neck pain.  No injury or trauma. Pt does wonder about if neck pain is due to sleeping awkwardly in bed while in inpatient rehab. Pain is with movement.  She has tried massage.   Pt is sober x 7 days. She recently returned from 3 week inpatient rehab for alcohol abuse in [place]. Pt states it was terrible.  Pt would like flu vaccine today.    Past Medical History:  Diagnosis Date  . Allergy   . Arthritis    fingers  . Breast cancer ([place]) 06/20/14   right breast  . Diverticulitis   . GERD (gastroesophageal reflux disease)   . Personal history of chemotherapy   . Personal history of radiation therapy     Past Surgical History:  Procedure Laterality Date  . ABDOMINAL HYSTERECTOMY  ~ 40 years ago  . APPENDECTOMY    . BREAST BIOPSY Left 10/6/210   no malignancy,extensive stromal fibrosis  . BREAST BIOPSY Right 06/20/14   invasive ductal ca,dcis   . BREAST LUMPECTOMY Right   . BREAST LUMPECTOMY WITH RADIOACTIVE SEED AND SENTINEL LYMPH NODE BIOPSY Right 12/02/2014   Procedure: RIGHT BREAST LUMPECTOMY WITH RADIOACTIVE SEED LOCALIZATION AND SENTINEL LYMPH NODE BIOPSY;  Surgeon: [name], MD;  Location: [facility];  Service: General;  Laterality: Right;  . COLON SURGERY  10/13/08   cecum polyp=adenomatous ,no high grade dysplasia or invasive malignancy  . HAND SURGERY     RIGHT  . KNEE ARTHROSCOPY     RT  . PORTACATH PLACEMENT Left 07/11/2014   Procedure: INSERTION PORT-A-CATH;  Surgeon: [name], MD;  Location: [facility];  Service: General;  Laterality: Left;  . TONSILLECTOMY      Social History   Socioeconomic History  . Marital status: Married    Spouse name: Not on file  . Number of children: Not on file  .  Years of education: Not on file  . Highest education level: Not on file  Occupational History  . Not on file  Tobacco Use  . Smoking status: Current Every Day Smoker    Packs/day: 0.50    Years: 56.00    Pack years: 28.00  . Smokeless tobacco: Never Used  Substance and Sexual Activity  . Alcohol use: Yes    Comment: OCCASIONAL  . Drug use: No  . Sexual activity: Not on file  Other Topics Concern  . Not on file  Social History Narrative  . Not on file   Social Determinants of Health   Financial Resource Strain:   . Difficulty of Paying Living Expenses: Not on file  Food Insecurity:   . Worried About Charity fundraiser in the Last Year: Not on file  . Ran Out of Food in the Last Year: Not on file  Transportation Needs:   . Lack of Transportation (Medical): Not on file  . Lack of Transportation (Non-Medical): Not on file  Physical Activity:   . Days of Exercise per Week: Not on file  . Minutes of Exercise per Session: Not on file  Stress:   . Feeling of Stress : Not on file  Social Connections:   . Frequency of Communication  with Friends and Family: Not on file  . Frequency of Social Gatherings with Friends and Family: Not on file  . Attends Religious Services: Not on file  . Active Member of Clubs or Organizations: Not on file  . Attends Archivist Meetings: Not on file  . Marital Status: Not on file  Intimate Partner Violence:   . Fear of Current or Ex-Partner: Not on file  . Emotionally Abused: Not on file  . Physically Abused: Not on file  . Sexually Abused: Not on file    Family History  Problem Relation Age of Onset  . CVA Mother      Immunization History  Administered Date(s) Administered  . PFIZER SARS-COV-2 Vaccination 03/31/2019, 04/24/2019    Outpatient Encounter Medications as of 11/19/2019  Medication Sig  . b complex vitamins tablet Take 1 tablet by mouth daily.  . Cholecalciferol (D3 ADULT PO) Take by mouth daily.  [name] letrozole  (FEMARA) 2.5 MG tablet TAKE 1 TABLET(2.5 MG) BY MOUTH DAILY  . potassium chloride (KLOR-CON) 10 MEQ tablet Take 10 mEq by mouth every morning.  . venlafaxine XR (EFFEXOR-XR) 150 MG 24 hr capsule Take 1 capsule (150 mg total) by mouth daily with breakfast.  . vitamin E 400 UNIT capsule Take 400 Units by mouth daily.   No facility-administered encounter medications on file as of 11/19/2019.     ROS: Pertinent positives and negatives noted in HPI. Remainder of ROS non-contributory    No Known Allergies  BP 126/80   Pulse 65   Temp (!) 97.3 F (36.3 C) (Temporal)   Ht 5' 4.5" (1.638 m)   Wt 112 lb (50.8 kg)   SpO2 94%   BMI 18.93 kg/m   Physical Exam Constitutional:      Appearance: Normal appearance. She is not ill-appearing.  Musculoskeletal:     Cervical back: No rigidity or crepitus. Pain with movement and muscular tenderness present. No spinous process tenderness. Decreased range of motion.  Neurological:     Mental Status: She is alert and oriented to person, place, and time.  Psychiatric:        Mood and Affect: Mood normal.        Behavior: Behavior normal.     A/P:  1. Musculoskeletal neck pain - heating pad 2x/day followed by stretching exercises Rx: - naproxen (NAPROSYN) 500 MG tablet; Take 1 tablet (500 mg total) by mouth 2 (two) times daily with a meal.  Dispense: 60 tablet; Refill: 0 - pt to take 1 twice per day with food x 5-7 days Rx: - tiZANidine (ZANAFLEX) 4 MG capsule; Take 1 capsule (4 mg total) by mouth at bedtime as needed for muscle spasms.  Dispense: 15 capsule; Refill: 0 - pt to take nightly x 3-4 nights - f/u if symptoms worsen or do not improve on 2-3 wks Discussed plan and reviewed medications with patient, including risks, benefits, and potential side effects. Pt expressed understand. All questions answered.    This visit occurred during the SARS-CoV-2 public health emergency.  Safety protocols were in place, including screening questions  prior to the visit, additional usage of staff PPE, and extensive cleaning of exam room while observing appropriate contact time as indicated for disinfecting solutions.

## 2019-11-19 NOTE — Addendum Note (Signed)
Addended by: Konrad Saha on: 11/19/2019 02:15 PM   Modules accepted: Orders

## 2019-12-17 ENCOUNTER — Telehealth: Payer: Self-pay | Admitting: Family Medicine

## 2019-12-17 NOTE — Telephone Encounter (Signed)
Patient dropped off Resident Medical Information Form to be completed by provider. She states that she needs this done by Thursday (if possible). Placed in providers folder for pickup.

## 2019-12-18 NOTE — Telephone Encounter (Signed)
Forms received and on Dr. Lurline Del desk.

## 2019-12-20 DIAGNOSIS — Z0279 Encounter for issue of other medical certificate: Secondary | ICD-10-CM

## 2020-01-08 NOTE — Progress Notes (Signed)
Patient Care Team: Ronnald Nian, DO as PCP - General (Family Medicine)  DIAGNOSIS:    ICD-10-CM   1. 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%,HER-2 positive ratio 2.97   06/30/2014 Breast MRI   Right breast 2.4 x 2.2 x 2.2 cm irregular mass, no abnormal lymph nodes   07/14/2014 - 10/28/2014 Neo-Adjuvant Chemotherapy   TCH Perjeta every 3 weeks 6 followed by Herceptin maintenance   11/06/2014 Breast MRI   Right breast mass has significantly decreased in size and is less masslike, maximal enhancement is 1.5 cm previously 2.4 cm   12/02/2014 Surgery   Right lumpectomy: Residual IDC grade 3; 2.4 cm with DCIS high-grade, posterior margin probably positive for invasive cancer, 1/4 lymph nodes positive with extracapsular extension, T2 N1 stage IIB   12/29/2014 - 01/26/2015 Radiation Therapy   Adjuvant radiation therapy   02/24/2015 -  Anti-estrogen oral therapy   Anastrozole 1 mg daily stopped due to hot flashes, emotional disturbances, decreased energy and decreased appetite stopped 05/19/15. Letrozole restarted 05/22/16     CHIEF COMPLIANT: Follow-up of right breast cancer on letrozole  INTERVAL HISTORY: Marilyn Dalton is a 79 y.o. with above-mentioned history of right breast cancer treated with neoadjuvant chemotherapy, lumpectomy, radiation, and who is currently on anti-estrogen therapy with letrozole. She presents to the clinic today for follow-up.  She lost her husband a year ago and she has finally been able to come out of her shower and is starting to feel slightly better.  She is planning to sell her house and move into a retirement community.  She is otherwise doing  reasonably well.  ALLERGIES:  has No Known Allergies.  MEDICATIONS:  Current Outpatient Medications  Medication Sig Dispense Refill  . b complex vitamins tablet Take 1 tablet by mouth daily.    . Cholecalciferol (D3 ADULT PO) Take by mouth daily.    [name] letrozole (FEMARA) 2.5 MG tablet TAKE 1 TABLET(2.5 MG) BY MOUTH DAILY 90 tablet 3  . naproxen (NAPROSYN) 500 MG tablet Take 1 tablet (500 mg total) by mouth 2 (two) times daily with a meal. 60 tablet 0  . potassium chloride (KLOR-CON) 10 MEQ tablet Take 10 mEq by mouth every morning.    [name] tiZANidine (ZANAFLEX) 4 MG capsule Take 1 capsule (4 mg total) by mouth at bedtime as needed for muscle spasms. 15 capsule 0  . venlafaxine XR (EFFEXOR-XR) 150 MG 24 hr capsule Take 1 capsule (150 mg total) by mouth daily with breakfast. 90 capsule 1  . vitamin E 400 UNIT capsule Take 400 Units by mouth daily.     No current facility-administered medications for this visit.    PHYSICAL EXAMINATION: ECOG PERFORMANCE STATUS: 1 - Symptomatic but completely ambulatory  Vitals:   01/09/20 1403  BP: 120/65  Pulse: 95  Resp: 18  Temp: (!) 97 F (36.1 C)  SpO2: 98%   Filed Weights   01/09/20 1403  Weight: 115 lb 3.2 oz (52.3 kg)    BREAST: No palpable masses or nodules in either right or left breasts. No palpable axillary supraclavicular or infraclavicular adenopathy no breast tenderness or nipple discharge. (exam performed in the presence of a chaperone)  LABORATORY DATA:  I have reviewed the data as listed CMP Latest Ref Rng & Units 09/05/2019 01/13/2019 07/07/2015  Glucose 70 - 99 mg/dL 98 99 101  BUN 6 - 23 mg/dL 7 5(L) 9.1  Creatinine 0.40 - 1.20 mg/dL 0.63 0.56 0.8  Sodium 135 - 145 mEq/L 141 140 139  Potassium 3.5 - 5.1 mEq/L 4.0 2.9(L) 5.0  Chloride 96 - 112 mEq/L 101 103 -  CO2 19 - 32 mEq/L 31 23 26   Calcium 8.4 - 10.5 mg/dL 9.6 9.2 9.9  Total Protein 6.0 - 8.3 g/dL 6.8 - 7.6  Total Bilirubin 0.2 - 1.2 mg/dL 0.8 - 0.42  Alkaline Phos  39 - 117 U/L 96 - 64  AST 0 - 37 U/L 36 - 32  ALT 0 - 35 U/L 19 - 22    Lab Results  Component Value Date   WBC 6.2 09/05/2019   HGB 15.6 (H) 09/05/2019   HCT 46.3 (H) 09/05/2019   MCV 104.0 (H) 09/05/2019   PLT 335.0 09/05/2019   NEUTROABS 6.4 01/13/2019    ASSESSMENT & PLAN:  Breast cancer of upper-inner quadrant of right female 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 Adjuvant antiestrogen therapy with anastrozole started January 2017 stopped March 2017letrozole resumed 05/22/2017 2. 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  The patient has a good understanding of the overall plan. she agrees with it. she will call with any problems that may develop before the next visit here.  Total time spent: 20 mins including face to face time and time spent for planning, charting and coordination of care  Nicholas Lose, MD 01/09/2020  I, Cloyde Reams Dorshimer, am acting as  scribe for Dr. Nicholas Lose.  I have reviewed the above documentation for accuracy and completeness, and I agree with the above.

## 2020-01-09 ENCOUNTER — Inpatient Hospital Stay: Payer: Medicare Other | Attending: Hematology and Oncology | Admitting: Hematology and Oncology

## 2020-01-09 ENCOUNTER — Other Ambulatory Visit: Payer: Self-pay

## 2020-01-09 ENCOUNTER — Telehealth: Payer: Self-pay | Admitting: Hematology and Oncology

## 2020-01-09 DIAGNOSIS — C50211 Malignant neoplasm of upper-inner quadrant of right female breast: Secondary | ICD-10-CM | POA: Insufficient documentation

## 2020-01-09 DIAGNOSIS — Z170 Estrogen receptor positive status [ER+]: Secondary | ICD-10-CM | POA: Diagnosis not present

## 2020-01-09 DIAGNOSIS — Z79811 Long term (current) use of aromatase inhibitors: Secondary | ICD-10-CM | POA: Diagnosis not present

## 2020-01-09 DIAGNOSIS — F329 Major depressive disorder, single episode, unspecified: Secondary | ICD-10-CM | POA: Insufficient documentation

## 2020-01-09 MED ORDER — LETROZOLE 2.5 MG PO TABS: ORAL_TABLET | ORAL | 3 refills | Status: DC

## 2020-01-09 NOTE — Assessment & Plan Note (Signed)
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 Adjuvant antiestrogen therapy with anastrozole started January 2017 stopped March 2017letrozole resumed 05/22/2017 2. 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

## 2020-01-09 NOTE — Telephone Encounter (Signed)
Scheduled appts per 11/18 los. Gave pt a print out of AVS.  

## 2020-04-16 ENCOUNTER — Other Ambulatory Visit: Payer: Self-pay

## 2020-04-17 ENCOUNTER — Ambulatory Visit (INDEPENDENT_AMBULATORY_CARE_PROVIDER_SITE_OTHER): Payer: Medicare Other | Admitting: Family Medicine

## 2020-04-17 ENCOUNTER — Encounter: Payer: Self-pay | Admitting: Family Medicine

## 2020-04-17 VITALS — BP 120/76 | HR 72 | Temp 97.6°F | Ht 64.5 in | Wt 110.4 lb

## 2020-04-17 DIAGNOSIS — I7300 Raynaud's syndrome without gangrene: Secondary | ICD-10-CM

## 2020-04-17 LAB — CBC WITH DIFFERENTIAL/PLATELET
Basophils Absolute: 0.1 10*3/uL (ref 0.0–0.1)
Basophils Relative: 0.7 % (ref 0.0–3.0)
Eosinophils Absolute: 0.1 10*3/uL (ref 0.0–0.7)
Eosinophils Relative: 1.1 % (ref 0.0–5.0)
HCT: 47.8 % — ABNORMAL HIGH (ref 36.0–46.0)
Hemoglobin: 16 g/dL — ABNORMAL HIGH (ref 12.0–15.0)
Lymphocytes Relative: 17.7 % (ref 12.0–46.0)
Lymphs Abs: 1.4 10*3/uL (ref 0.7–4.0)
MCHC: 33.4 g/dL (ref 30.0–36.0)
MCV: 94.5 fl (ref 78.0–100.0)
Monocytes Absolute: 0.8 10*3/uL (ref 0.1–1.0)
Monocytes Relative: 10.2 % (ref 3.0–12.0)
Neutro Abs: 5.6 10*3/uL (ref 1.4–7.7)
Neutrophils Relative %: 70.3 % (ref 43.0–77.0)
Platelets: 336 10*3/uL (ref 150.0–400.0)
RBC: 5.06 Mil/uL (ref 3.87–5.11)
RDW: 14.2 % (ref 11.5–15.5)
WBC: 8 10*3/uL (ref 4.0–10.5)

## 2020-04-17 NOTE — Progress Notes (Signed)
Marilyn Dalton is a 80 y.o. female  Chief Complaint  Patient presents with  . Acute Visit    C/o having hand pain x months and have been turning purple x 2 months.       HPI: Marilyn Dalton is a 80 y.o. female who complains of fingers and hands turning white x years. Symptoms have progressed in the past 2 mo and now is a purplish discoloration.Worse with cold weather and cold exposure. Symptoms have progressed over the past  Involved areas "sting" when she runs under hot water and then discoloration improves. Without hot water it will take 10+min to improve/resolve.   Pt is a smoker, about 1/2 PPD x 50+ years. 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 No swelling.  No joint pains. No rash.  Past Medical History:  Diagnosis Date  . Allergy   . Arthritis    fingers  . Breast cancer ([place]) 06/20/14   right breast  . Diverticulitis   . GERD (gastroesophageal reflux disease)   . Personal history of chemotherapy   . Personal history of radiation therapy     Past Surgical History:  Procedure Laterality Date  . ABDOMINAL HYSTERECTOMY  ~ 40 years ago  . APPENDECTOMY    . BREAST BIOPSY Left 10/6/210   no malignancy,extensive stromal fibrosis  . BREAST BIOPSY Right 06/20/14   invasive ductal ca,dcis   . BREAST LUMPECTOMY Right   . BREAST LUMPECTOMY WITH RADIOACTIVE SEED AND SENTINEL LYMPH NODE BIOPSY Right 12/02/2014   Procedure: RIGHT BREAST LUMPECTOMY WITH RADIOACTIVE SEED LOCALIZATION AND SENTINEL LYMPH NODE BIOPSY;  Surgeon: [name], MD;  Location: [facility];  Service: General;  Laterality: Right;  . COLON SURGERY  10/13/08   cecum polyp=adenomatous ,no high grade dysplasia or invasive malignancy  . HAND SURGERY     RIGHT  . KNEE ARTHROSCOPY     RT  . PORTACATH PLACEMENT Left 07/11/2014   Procedure: INSERTION PORT-A-CATH;  Surgeon: [name], MD;  Location: [facility];  Service: General;  Laterality: Left;  . TONSILLECTOMY       Social History   Socioeconomic History  . Marital status: Married    Spouse name: Not on file  . Number of children: Not on file  . Years of education: Not on file  . Highest education level: Not on file  Occupational History  . Not on file  Tobacco Use  . Smoking status: Current Every Day Smoker    Packs/day: 0.50    Years: 56.00    Pack years: 28.00  . Smokeless tobacco: Never Used  Vaping Use  . Vaping Use: Never used  Substance and Sexual Activity  . Alcohol use: Yes    Comment: OCCASIONAL  . Drug use: No  . Sexual activity: Yes  Other Topics Concern  . Not on file  Social History Narrative  . Not on file   Social Determinants of Health   Financial Resource Strain: Not on file  Food Insecurity: Not on file  Transportation Needs: Not on file  Physical Activity: Not on file  Stress: Not on file  Social Connections: Not on file  Intimate Partner Violence: Not on file    Family History  Problem Relation Age of Onset  . CVA Mother      Immunization History  Administered Date(s) Administered  . Fluad Quad(high Dose 65+) 11/19/2019  . PFIZER(Purple Top)SARS-COV-2 Vaccination 03/31/2019, 04/24/2019    Outpatient Encounter Medications as of 04/17/2020  Medication Sig  . amoxicillin (AMOXIL) 500 MG tablet Take 500 mg by mouth 3 (three) times daily.  [name] b complex vitamins tablet Take 1 tablet by mouth daily.  [name] letrozole (FEMARA) 2.5 MG tablet TAKE 1 TABLET(2.5 MG) BY MOUTH DAILY  . potassium chloride (KLOR-CON) 10 MEQ tablet Take 10 mEq by mouth every morning.  . vitamin E 400 UNIT capsule Take 400 Units by mouth daily.   No facility-administered encounter medications on file as of 04/17/2020.     ROS: Pertinent positives and negatives noted in HPI. Remainder of ROS non-contributory   No Known Allergies  BP 120/76   Pulse 72   Temp 97.6 F (36.4 C) (Temporal)   Ht 5' 4.5" (1.638 m)   Wt 110 lb 6.4 oz (50.1 kg)   BMI 18.66 kg/m   Wt Readings  from Last 3 Encounters:  04/17/20 110 lb 6.4 oz (50.1 kg)  01/09/20 115 lb 3.2 oz (52.3 kg)  11/19/19 112 lb (50.8 kg)   Temp Readings from Last 3 Encounters:  04/17/20 97.6 F (36.4 C) (Temporal)  01/09/20 (!) 97 F (36.1 C) (Tympanic)  11/19/19 (!) 97.3 F (36.3 C) (Temporal)   BP Readings from Last 3 Encounters:  04/17/20 120/76  01/09/20 120/65  11/19/19 126/80   Pulse Readings from Last 3 Encounters:  04/17/20 72  01/09/20 95  11/19/19 65     Physical Exam Constitutional:      General: She is not in acute distress.    Appearance: She is not ill-appearing.  Skin:    Capillary Refill: Capillary refill takes 2 to 3 seconds.     Comments: Finger tips are bluish/purple when she is holding bag of ice here in office; when ice removed, it takes 10-7min for normal color to return  Neurological:     Mental Status: She is alert and oriented to person, place, and time.  Psychiatric:        Mood and Affect: Mood normal.        Behavior: Behavior normal.      A/P:  1. Raynaud's phenomenon without gangrene - ANA+ENA+DNA/DS+Scl 70+SjoSSA/B - Rheumatoid factor - CBC w/Diff - pt with borderline low BP and at times she is symptomatic with SBP 100-106 so I do not think she can tolerate CCB. Pt does not want any medication   This visit occurred during the SARS-CoV-2 public health emergency.  Safety protocols were in place, including screening questions prior to the visit, additional usage of staff PPE, and extensive cleaning of exam room while observing appropriate contact time as indicated for disinfecting solutions.

## 2020-04-18 LAB — RHEUMATOID FACTOR: Rheumatoid fact SerPl-aCnc: <14 IU/mL (ref <14)

## 2020-04-23 LAB — ANA+ENA+DNA/DS+SCL 70+SJOSSA/B
ANA Titer 1: POSITIVE — AB
ENA RNP Ab: <0.2 AI (ref 0.0–0.9)
ENA SM Ab Ser-aCnc: <0.2 AI (ref 0.0–0.9)
ENA SSA (RO) Ab: <0.2 AI (ref 0.0–0.9)
ENA SSB (LA) Ab: <0.2 AI (ref 0.0–0.9)
Scleroderma (Scl-70) (ENA) Antibody, IgG: <0.2 AI (ref 0.0–0.9)
dsDNA Ab: <1 IU/mL (ref 0–9)

## 2020-04-23 LAB — FANA STAINING PATTERNS: Homogeneous Pattern: 1:80 {titer}

## 2020-04-24 ENCOUNTER — Encounter: Payer: Self-pay | Admitting: Family Medicine

## 2020-07-28 ENCOUNTER — Telehealth: Payer: Self-pay | Admitting: Family Medicine

## 2020-07-28 NOTE — Telephone Encounter (Signed)
Left message for patient to call back and schedule Medicare Annual Wellness Visit (AWV).   Please offer to do virtually or by telephone.   Due for AWVI  Please schedule at anytime with Nurse Health Advisor.   

## 2020-08-07 ENCOUNTER — Telehealth: Payer: Self-pay

## 2020-08-07 DIAGNOSIS — C50211 Malignant neoplasm of upper-inner quadrant of right female breast: Secondary | ICD-10-CM

## 2020-08-07 NOTE — Telephone Encounter (Signed)
Pt called stating she had R) lumpectomy and is now having (L) breast pain and states she is able to palpate lumps in her (L) breast. Pt states she has not had a MM since 2020 because they are painful for her. Pt was offered appt with Wilber Bihari, NP for 08/10/20 at 0930 labs and 1000 NP visit. Pt accepted and verbalized thanks and understanding.

## 2020-08-10 ENCOUNTER — Other Ambulatory Visit: Payer: Self-pay

## 2020-08-10 ENCOUNTER — Encounter: Payer: Self-pay | Admitting: Adult Health

## 2020-08-10 ENCOUNTER — Inpatient Hospital Stay: Payer: Medicare Other | Attending: Hematology and Oncology

## 2020-08-10 ENCOUNTER — Inpatient Hospital Stay (HOSPITAL_BASED_OUTPATIENT_CLINIC_OR_DEPARTMENT_OTHER): Payer: Medicare Other | Admitting: Adult Health

## 2020-08-10 VITALS — BP 134/82 | HR 83 | Temp 96.1°F | Resp 14 | Ht 64.5 in | Wt 107.5 lb

## 2020-08-10 DIAGNOSIS — F329 Major depressive disorder, single episode, unspecified: Secondary | ICD-10-CM | POA: Insufficient documentation

## 2020-08-10 DIAGNOSIS — C50211 Malignant neoplasm of upper-inner quadrant of right female breast: Secondary | ICD-10-CM | POA: Insufficient documentation

## 2020-08-10 DIAGNOSIS — Z170 Estrogen receptor positive status [ER+]: Secondary | ICD-10-CM | POA: Diagnosis not present

## 2020-08-10 DIAGNOSIS — N951 Menopausal and female climacteric states: Secondary | ICD-10-CM | POA: Insufficient documentation

## 2020-08-10 DIAGNOSIS — C773 Secondary and unspecified malignant neoplasm of axilla and upper limb lymph nodes: Secondary | ICD-10-CM | POA: Diagnosis not present

## 2020-08-10 DIAGNOSIS — Z923 Personal history of irradiation: Secondary | ICD-10-CM | POA: Insufficient documentation

## 2020-08-10 DIAGNOSIS — E2839 Other primary ovarian failure: Secondary | ICD-10-CM

## 2020-08-10 DIAGNOSIS — F17210 Nicotine dependence, cigarettes, uncomplicated: Secondary | ICD-10-CM | POA: Diagnosis not present

## 2020-08-10 LAB — CBC WITH DIFFERENTIAL (CANCER CENTER ONLY)
Abs Immature Granulocytes: 0.01 10*3/uL (ref 0.00–0.07)
Basophils Absolute: 0.1 10*3/uL (ref 0.0–0.1)
Basophils Relative: 1 %
Eosinophils Absolute: 0.2 10*3/uL (ref 0.0–0.5)
Eosinophils Relative: 2 %
HCT: 47.1 % — ABNORMAL HIGH (ref 36.0–46.0)
Hemoglobin: 15.7 g/dL — ABNORMAL HIGH (ref 12.0–15.0)
Immature Granulocytes: 0 %
Lymphocytes Relative: 29 %
Lymphs Abs: 1.9 10*3/uL (ref 0.7–4.0)
MCH: 31.3 pg (ref 26.0–34.0)
MCHC: 33.3 g/dL (ref 30.0–36.0)
MCV: 93.8 fL (ref 80.0–100.0)
Monocytes Absolute: 0.6 10*3/uL (ref 0.1–1.0)
Monocytes Relative: 10 %
Neutro Abs: 3.9 10*3/uL (ref 1.7–7.7)
Neutrophils Relative %: 58 %
Platelet Count: 294 10*3/uL (ref 150–400)
RBC: 5.02 MIL/uL (ref 3.87–5.11)
RDW: 13.7 % (ref 11.5–15.5)
WBC Count: 6.7 10*3/uL (ref 4.0–10.5)
nRBC: 0 % (ref 0.0–0.2)

## 2020-08-10 LAB — CMP (CANCER CENTER ONLY)
ALT: 13 U/L (ref 0–44)
AST: 18 U/L (ref 15–41)
Albumin: 4.4 g/dL (ref 3.5–5.0)
Alkaline Phosphatase: 65 U/L (ref 38–126)
Anion gap: 6 (ref 5–15)
BUN: 10 mg/dL (ref 8–23)
CO2: 30 mmol/L (ref 22–32)
Calcium: 9.8 mg/dL (ref 8.9–10.3)
Chloride: 103 mmol/L (ref 98–111)
Creatinine: 0.69 mg/dL (ref 0.44–1.00)
GFR, Estimated: >60 mL/min (ref >60)
Glucose, Bld: 88 mg/dL (ref 70–99)
Potassium: 4.1 mmol/L (ref 3.5–5.1)
Sodium: 139 mmol/L (ref 135–145)
Total Bilirubin: 0.7 mg/dL (ref 0.3–1.2)
Total Protein: 7.5 g/dL (ref 6.5–8.1)

## 2020-08-10 NOTE — Assessment & Plan Note (Signed)
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 Adjuvant antiestrogen therapy with anastrozole started January 2017 stopped March 2017letrozole resumed 05/22/2017 2. Herceptin maintenancecompleted May 2017 Echocardiogram 05/20/2015: EF 60-65%  Letrozole toxicities: Hot flashes Patient husband passed away in 03-04-2018  Major depression:Well controlled on Effexor 75 mg daily  Breast cancer surveillance: 1.Annual mammograms9/21/2020: Benign breast density category C Bone density 07/15/2015: Normal, this will need to be repeated 2.breast exam 01/09/2020: Benign  I have placed order for repeat mammogram/ultrasound, and bone density testing.  She will continue letrozole daily.  She is not yet ready to quit smoking.  Health maintenance recommendations were also reviewed today.

## 2020-08-10 NOTE — Progress Notes (Signed)
[facility] Follow up:    Marilyn Nian, DO [place] [address]   DIAGNOSIS: Cancer Staging No matching staging information was found for the patient.  SUMMARY OF ONCOLOGIC HISTORY: Oncology History  Breast cancer of upper-inner quadrant of right female breast ([place])  06/20/2014 Initial Diagnosis   Right breast 3:00: Invasive ductal carcinoma with DCIS, grade 2,ER 90%, PR 1%,HER-2 positive ratio 2.97   06/30/2014 Breast MRI   Right breast 2.4 x 2.2 x 2.2 cm irregular mass, no abnormal lymph nodes    07/14/2014 - 10/28/2014 Neo-Adjuvant Chemotherapy   TCH Perjeta every 3 weeks 6 followed by Herceptin maintenance    11/06/2014 Breast MRI   Right breast mass has significantly decreased in size and is less masslike, maximal enhancement is 1.5 cm previously 2.4 cm    12/02/2014 Surgery   Right lumpectomy: Residual IDC grade 3; 2.4 cm with DCIS high-grade, posterior margin probably positive for invasive cancer, 1/4 lymph nodes positive with extracapsular extension, T2 N1 stage IIB    12/29/2014 - 01/26/2015 Radiation Therapy   Adjuvant radiation therapy    02/24/2015 -  Anti-estrogen oral therapy   Anastrozole 1 mg daily stopped due to hot flashes, emotional disturbances, decreased energy and decreased appetite stopped 05/19/15. Letrozole restarted 05/22/16     CURRENT THERAPY: Letrozole  INTERVAL HISTORY: Marilyn Dalton 80 y.o. female returns for urgent evaluation of breast changes.  She notes a pain and tenderness in her left retroareolar region that has been present for the past couple of weeks.  Her most recent mammogram was completed 10/2018 and was normal.  She missed her mammogram that was due in 10/2019.  Since her most recent visit she has been struggling with Raynauds and is following up iwht a specialist.  She denies any new issues otherwise, and is planning to sell her house.  A detailed ROS was otherwise non contributory.      Patient Active Problem List   Diagnosis Date Noted   Personal history of antineoplastic chemotherapy 09/28/2017   Personal history of radiation therapy 09/28/2017   LBBB (left bundle branch block) 09/28/2017   Alcohol use disorder, severe, dependence ([place]) 05/23/2017   AR (allergic rhinitis) 07/09/2015   Postmenopausal HRT (hormone replacement therapy) 07/09/2015   Tobacco dependence 07/09/2015   Major depression 05/26/2015   Gastritis determined by endoscopy 01/28/2015   Tobacco use disorder 11/18/2014   Diarrhea 10/14/2014   Dehydration 10/14/2014   Neoplastic malignant related fatigue 10/06/2014   Nausea without vomiting 10/06/2014   Constipation 10/06/2014   Orthostatic hypotension 10/06/2014   At risk for impaired cardiac function 07/04/2014   Breast cancer of upper-inner quadrant of right female breast ([place]) 07/03/2014    has No Known Allergies.  MEDICAL HISTORY: Past Medical History:  Diagnosis Date   Allergy    Arthritis    fingers   Breast cancer ([place]) 06/20/14   right breast   Diverticulitis    GERD (gastroesophageal reflux disease)    Personal history of chemotherapy    Personal history of radiation therapy     SURGICAL HISTORY: Past Surgical History:  Procedure Laterality Date   ABDOMINAL HYSTERECTOMY  ~ 40 years ago   APPENDECTOMY     BREAST BIOPSY Left 10/6/210   no malignancy,extensive stromal fibrosis   BREAST BIOPSY Right 06/20/14   invasive ductal ca,dcis    BREAST LUMPECTOMY Right    BREAST LUMPECTOMY WITH RADIOACTIVE SEED AND SENTINEL LYMPH NODE BIOPSY Right  12/02/2014   Procedure: RIGHT BREAST LUMPECTOMY WITH RADIOACTIVE SEED LOCALIZATION AND SENTINEL LYMPH NODE BIOPSY;  Surgeon: [name], MD;  Location: [facility];  Service: General;  Laterality: Right;   COLON SURGERY  10/13/08   cecum polyp=adenomatous ,no high grade dysplasia or invasive malignancy   HAND SURGERY     RIGHT   KNEE ARTHROSCOPY     RT   PORTACATH  PLACEMENT Left 07/11/2014   Procedure: INSERTION PORT-A-CATH;  Surgeon: [name], MD;  [facility];  Service: General;  Laterality: Left;   TONSILLECTOMY      SOCIAL HISTORY: Social History   Socioeconomic History   Marital status: Married    Spouse name: Not on file   Number of children: Not on file   Years of education: Not on file   Highest education level: Not on file  Occupational History   Not on file  Tobacco Use   Smoking status: Every Day    Packs/day: 0.50    Years: 56.00    Pack years: 28.00    Types: Cigarettes   Smokeless tobacco: Never  Vaping Use   Vaping Use: Never used  Substance and Sexual Activity   Alcohol use: Yes    Comment: OCCASIONAL   Drug use: No   Sexual activity: Yes  Other Topics Concern   Not on file  Social History Narrative   Not on file   Social Determinants of Health   Financial Resource Strain: Not on file  Food Insecurity: Not on file  Transportation Needs: Not on file  Physical Activity: Not on file  Stress: Not on file  Social Connections: Not on file  Intimate Partner Violence: Not on file    FAMILY HISTORY: Family History  Problem Relation Age of Onset   CVA Mother     Review of Systems  Constitutional:  Positive for fatigue. Negative for appetite change, chills, fever and unexpected weight change.  HENT:   Negative for hearing loss, lump/mass and trouble swallowing.   Eyes:  Negative for eye problems and icterus.  Respiratory:  Negative for chest tightness, cough and shortness of breath.   Cardiovascular:  Negative for chest pain, leg swelling and palpitations.  Gastrointestinal:  Negative for abdominal distention, abdominal pain, constipation, diarrhea, nausea and vomiting.  Endocrine: Negative for hot flashes.  Genitourinary:  Negative for difficulty urinating.   Musculoskeletal:  Negative for arthralgias.  Skin:  Negative for itching and rash.  Neurological:  Negative for dizziness, extremity  weakness, headaches and numbness.  Hematological:  Negative for adenopathy. Does not bruise/bleed easily.  Psychiatric/Behavioral:  Negative for depression. The patient is not nervous/anxious.      PHYSICAL EXAMINATION  ECOG PERFORMANCE STATUS: 1 - Symptomatic but completely ambulatory  Vitals:   08/10/20 0924  BP: 134/82  Pulse: 83  Resp: 14  Temp: (!) 96.1 F (35.6 C)  SpO2: 99%    Physical Exam Constitutional:      General: She is not in acute distress.    Appearance: Normal appearance. She is not toxic-appearing.  HENT:     Head: Normocephalic and atraumatic.  Eyes:     General: No scleral icterus. Cardiovascular:     Rate and Rhythm: Normal rate and regular rhythm.     Pulses: Normal pulses.     Heart sounds: Normal heart sounds.  Pulmonary:     Effort: Pulmonary effort is normal.     Breath sounds: Normal breath sounds.     Comments: Right breast  s/p lumpectomy and radiation, no sign of local recurrence, left breast benign. Abdominal:     General: Abdomen is flat. Bowel sounds are normal. There is no distension.     Palpations: Abdomen is soft.     Tenderness: There is no abdominal tenderness.  Musculoskeletal:        General: No swelling.     Cervical back: Neck supple.  Lymphadenopathy:     Cervical: No cervical adenopathy.  Skin:    General: Skin is warm and dry.     Findings: No rash.  Neurological:     General: No focal deficit present.     Mental Status: She is alert.  Psychiatric:        Mood and Affect: Mood normal.        Behavior: Behavior normal.    LABORATORY DATA:  CBC    Component Value Date/Time   WBC 6.7 08/10/2020 0910   WBC 8.0 04/17/2020 1131   RBC 5.02 08/10/2020 0910   HGB 15.7 (H) 08/10/2020 0910   HGB 14.8 07/07/2015 0812   HCT 47.1 (H) 08/10/2020 0910   HCT 43.4 07/07/2015 0812   PLT 294 08/10/2020 0910   PLT 258 07/07/2015 0812   MCV 93.8 08/10/2020 0910   MCV 99.5 07/07/2015 0812   MCH 31.3 08/10/2020 0910   MCHC  33.3 08/10/2020 0910   RDW 13.7 08/10/2020 0910   RDW 15.1 (H) 07/07/2015 0812   LYMPHSABS 1.9 08/10/2020 0910   LYMPHSABS 1.3 07/07/2015 0812   MONOABS 0.6 08/10/2020 0910   MONOABS 0.8 07/07/2015 0812   EOSABS 0.2 08/10/2020 0910   EOSABS 0.1 07/07/2015 0812   BASOSABS 0.1 08/10/2020 0910   BASOSABS 0.0 07/07/2015 0812    CMP     Component Value Date/Time   NA 139 08/10/2020 0910   NA 139 07/07/2015 0813   K 4.1 08/10/2020 0910   K 5.0 07/07/2015 0813   CL 103 08/10/2020 0910   CO2 30 08/10/2020 0910   CO2 26 07/07/2015 0813   GLUCOSE 88 08/10/2020 0910   GLUCOSE 101 07/07/2015 0813   BUN 10 08/10/2020 0910   BUN 9.1 07/07/2015 0813   CREATININE 0.69 08/10/2020 0910   CREATININE 0.8 07/07/2015 0813   CALCIUM 9.8 08/10/2020 0910   CALCIUM 9.9 07/07/2015 0813   PROT 7.5 08/10/2020 0910   PROT 7.6 07/07/2015 0813   ALBUMIN 4.4 08/10/2020 0910   ALBUMIN 4.1 07/07/2015 0813   AST 18 08/10/2020 0910   AST 32 07/07/2015 0813   ALT 13 08/10/2020 0910   ALT 22 07/07/2015 0813   ALKPHOS 65 08/10/2020 0910   ALKPHOS 64 07/07/2015 0813   BILITOT 0.7 08/10/2020 0910   BILITOT 0.42 07/07/2015 0813   GFRNONAA >60 08/10/2020 0910   GFRAA >60 01/13/2019 0217           ASSESSMENT and THERAPY PLAN:   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 Adjuvant antiestrogen  therapy with  anastrozole started January 2017 stopped March 2017 letrozole resumed 05/22/2017 2. Herceptin maintenance completed May 2017 Echocardiogram 05/20/2015: EF 60-65%   Letrozole toxicities: Hot flashes Patient husband passed away in 03/04/18   Major depression: Well controlled on Effexor 75 mg daily   Breast cancer surveillance:  1. Annual mammograms 11/12/2018: Benign breast density category C Bone density 07/15/2015: Normal, this will need to be repeated 2. breast exam 01/09/2020: Benign  I have placed order for repeat mammogram/ultrasound, and bone density testing.  She will continue letrozole daily.  She is not yet ready to quit smoking.  Health maintenance recommendations were also reviewed today.      Orders Placed This Encounter  Procedures   MM DIAG BREAST TOMO BILATERAL    Standing Status:   Future    Standing Expiration Date:   08/10/2021    Order Specific Question:   Reason for Exam (SYMPTOM  OR DIAGNOSIS REQUIRED)    Answer:   left breast retroareolar pain    Order Specific Question:   Preferred imaging location?    Answer:   [facility]   US BREAST LTD UNI LEFT INC AXILLA    Standing Status:   Future    Standing Expiration Date:   08/10/2021    Order Specific Question:   Reason for Exam (SYMPTOM  OR DIAGNOSIS REQUIRED)    Answer:   left breast retroareolar pain    Order Specific Question:   Preferred imaging location?    Answer:   [facility]   DG Bone Density    Standing Status:   Future    Standing Expiration Date:   08/10/2021    Order Specific Question:   Reason for Exam (SYMPTOM  OR DIAGNOSIS REQUIRED)    Answer:   estrogen deficiency    Order Specific Question:   Preferred imaging location?    Answer:   [facility]    All questions were answered. The patient knows to call the clinic with any problems, questions or concerns. We can certainly see the patient much sooner if necessary. This note was electronically signed.  Total encounter time: 30  minutes in chart review, lab review, face to face visit time, order entry, and documentation of the encounter.  Wilber Bihari, NP 08/10/20 10:02 AM Medical Oncology and Hematology [facility] [place], [place] [address] Tel. [phone]    Fax. [phone]  *Total Encounter Time as defined by the Centers for Medicare and Medicaid Services includes, in addition to the face-to-face time of a patient visit (documented in the note above) non-face-to-face time: obtaining and reviewing outside history, ordering and reviewing medications, tests or procedures, care coordination (communications with other health care professionals or caregivers) and documentation in the medical record.

## 2020-08-10 NOTE — Progress Notes (Deleted)
[facility] Follow up:    Marilyn Nian, DO [place] [address]   DIAGNOSIS: Cancer Staging No matching staging information was found for the patient.  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%,HER-2 positive ratio 2.97   06/30/2014 Breast MRI   Right breast 2.4 x 2.2 x 2.2 cm irregular mass, no abnormal lymph nodes    07/14/2014 - 10/28/2014 Neo-Adjuvant Chemotherapy   TCH Perjeta every 3 weeks 6 followed by Herceptin maintenance    11/06/2014 Breast MRI   Right breast mass has significantly decreased in size and is less masslike, maximal enhancement is 1.5 cm previously 2.4 cm    12/02/2014 Surgery   Right lumpectomy: Residual IDC grade 3; 2.4 cm with DCIS high-grade, posterior margin probably positive for invasive cancer, 1/4 lymph nodes positive with extracapsular extension, T2 N1 stage IIB    12/29/2014 - 01/26/2015 Radiation Therapy   Adjuvant radiation therapy    02/24/2015 -  Anti-estrogen oral therapy   Anastrozole 1 mg daily stopped due to hot flashes, emotional disturbances, decreased energy and decreased appetite stopped 05/19/15. Letrozole restarted 05/22/16     CURRENT THERAPY:  INTERVAL HISTORY: Marilyn Dalton 80 y.o. female returns for    Patient Active Problem List   Diagnosis Date Noted   Personal history of antineoplastic chemotherapy 09/28/2017   Personal history of radiation therapy 09/28/2017   LBBB (left bundle branch block) 09/28/2017   Alcohol use disorder, severe, dependence ([place]) 05/23/2017   AR (allergic rhinitis) 07/09/2015   Postmenopausal HRT (hormone replacement therapy) 07/09/2015   Tobacco dependence 07/09/2015   Major depression 05/26/2015   Gastritis determined by endoscopy 01/28/2015   Tobacco use  disorder 11/18/2014   Diarrhea 10/14/2014   Dehydration 10/14/2014   Neoplastic malignant related fatigue 10/06/2014   Nausea without vomiting 10/06/2014   Constipation 10/06/2014   Orthostatic hypotension 10/06/2014   At risk for impaired cardiac function 07/04/2014   Breast cancer of upper-inner quadrant of right female breast ([place]) 07/03/2014    has No Known Allergies.  MEDICAL HISTORY: Past Medical History:  Diagnosis Date   Allergy    Arthritis    fingers   Breast cancer ([place]) 06/20/14   right breast   Diverticulitis    GERD (gastroesophageal reflux disease)    Personal history of chemotherapy    Personal history of radiation therapy     SURGICAL HISTORY: Past Surgical History:  Procedure Laterality Date   ABDOMINAL HYSTERECTOMY  ~ 40 years ago   APPENDECTOMY     BREAST BIOPSY Left 10/6/210   no malignancy,extensive stromal fibrosis   BREAST BIOPSY Right 06/20/14   invasive ductal ca,dcis    BREAST LUMPECTOMY Right    BREAST LUMPECTOMY WITH RADIOACTIVE SEED AND SENTINEL LYMPH NODE BIOPSY Right 12/02/2014   Procedure: RIGHT BREAST LUMPECTOMY WITH RADIOACTIVE SEED LOCALIZATION AND SENTINEL LYMPH NODE BIOPSY;  Surgeon: [name], MD;  Location: [facility];  Service: General;  Laterality: Right;   COLON SURGERY  10/13/08   cecum polyp=adenomatous ,no high grade dysplasia or invasive malignancy   HAND SURGERY     RIGHT   KNEE ARTHROSCOPY     RT   PORTACATH PLACEMENT Left 07/11/2014   Procedure: INSERTION PORT-A-CATH;  Surgeon: [name], MD;  Location: [facility];  Service: General;  Laterality: Left;   TONSILLECTOMY      SOCIAL HISTORY: Social History   Socioeconomic History   Marital status: Married    Spouse name: Not on file   Number of children: Not on file   Years of education: Not on file   Highest education level: Not on file  Occupational History   Not on file  Tobacco Use   Smoking status: Every Day    Packs/day: 0.50     Years: 56.00    Pack years: 28.00    Types: Cigarettes   Smokeless tobacco: Never  Vaping Use   Vaping Use: Never used  Substance and Sexual Activity   Alcohol use: Yes    Comment: OCCASIONAL   Drug use: No   Sexual activity: Yes  Other Topics Concern   Not on file  Social History Narrative   Not on file   Social Determinants of Health   Financial Resource Strain: Not on file  Food Insecurity: Not on file  Transportation Needs: Not on file  Physical Activity: Not on file  Stress: Not on file  Social Connections: Not on file  Intimate Partner Violence: Not on file    FAMILY HISTORY: Family History  Problem Relation Age of Onset   CVA Mother     Review of Systems - Oncology    PHYSICAL EXAMINATION  ECOG PERFORMANCE STATUS: {CHL ONC ECOG ZP:[id number]}  Vitals:   08/10/20 0924  BP: 134/82  Pulse: 83  Resp: 14  Temp: (!) 96.1 F (35.6 C)  SpO2: 99%    Physical Exam  LABORATORY DATA:  CBC    Component Value Date/Time   WBC 6.7 08/10/2020 0910   WBC 8.0 04/17/2020 1131   RBC 5.02 08/10/2020 0910   HGB 15.7 (H) 08/10/2020 0910   HGB 14.8 07/07/2015 0812   HCT 47.1 (H) 08/10/2020 0910   HCT 43.4 07/07/2015 0812   PLT 294 08/10/2020 0910   PLT 258 07/07/2015 0812   MCV 93.8 08/10/2020 0910   MCV 99.5 07/07/2015 0812   MCH 31.3 08/10/2020 0910   MCHC 33.3 08/10/2020 0910   RDW 13.7 08/10/2020 0910   RDW 15.1 (H) 07/07/2015 0812   LYMPHSABS 1.9 08/10/2020 0910   LYMPHSABS 1.3 07/07/2015 0812   MONOABS 0.6 08/10/2020 0910   MONOABS 0.8 07/07/2015 0812   EOSABS 0.2 08/10/2020 0910   EOSABS 0.1 07/07/2015 0812   BASOSABS 0.1 08/10/2020 0910   BASOSABS 0.0 07/07/2015 0812    CMP     Component Value Date/Time   NA 141 09/05/2019 0942   NA 139 07/07/2015 0813   K 4.0 09/05/2019 0942   K 5.0 07/07/2015 0813   CL 101 09/05/2019 0942   CO2 31 09/05/2019 0942   CO2 26 07/07/2015 0813   GLUCOSE 98 09/05/2019 0942   GLUCOSE 101 07/07/2015 0813    BUN 7 09/05/2019 0942   BUN 9.1 07/07/2015 0813   CREATININE 0.63 09/05/2019 0942   CREATININE 0.8 07/07/2015 0813   CALCIUM 9.6 09/05/2019 0942   CALCIUM 9.9 07/07/2015 0813   PROT 6.8 09/05/2019 0942   PROT 7.6 07/07/2015 0813   ALBUMIN 4.2 09/05/2019 0942   ALBUMIN 4.1 07/07/2015 0813   AST 36 09/05/2019 0942   AST 32 07/07/2015 0813   ALT 19 09/05/2019 0942   ALT 22 07/07/2015 0813   ALKPHOS 96 09/05/2019 0942   ALKPHOS 64 07/07/2015 0813   BILITOT 0.8 09/05/2019 0942   BILITOT 0.42 07/07/2015 0813   GFRNONAA >60  01/13/2019 0217   GFRAA >60 01/13/2019 0217       PENDING LABS:   RADIOGRAPHIC STUDIES:  No results found.   PATHOLOGY:     ASSESSMENT and THERAPY PLAN:   No problem-specific Assessment & Plan notes found for this encounter.   No orders of the defined types were placed in this encounter.   All questions were answered. The patient knows to call the clinic with any problems, questions or concerns. We can certainly see the patient much sooner if necessary. This note was electronically signed. Scot Dock, NP 08/10/2020

## 2020-08-11 ENCOUNTER — Telehealth: Payer: Self-pay | Admitting: Hematology and Oncology

## 2020-08-11 NOTE — Telephone Encounter (Signed)
Scheduled appointment per 06/20 los. Left message.

## 2020-09-23 ENCOUNTER — Ambulatory Visit
Admission: RE | Admit: 2020-09-23 | Discharge: 2020-09-23 | Disposition: A | Discharge status: Home / Self Care | Payer: Medicare Other | Source: Ambulatory Visit | Attending: Adult Health | Admitting: Adult Health

## 2020-09-23 ENCOUNTER — Other Ambulatory Visit: Payer: Self-pay

## 2020-09-23 DIAGNOSIS — Z170 Estrogen receptor positive status [ER+]: Secondary | ICD-10-CM

## 2020-09-23 DIAGNOSIS — C50211 Malignant neoplasm of upper-inner quadrant of right female breast: Secondary | ICD-10-CM

## 2021-01-07 ENCOUNTER — Ambulatory Visit: Payer: Medicare Other | Admitting: Hematology and Oncology

## 2021-01-22 ENCOUNTER — Ambulatory Visit
Admission: RE | Admit: 2021-01-22 | Discharge: 2021-01-22 | Disposition: A | Discharge status: Home / Self Care | Payer: Medicare Other | Source: Ambulatory Visit | Attending: Adult Health | Admitting: Adult Health

## 2021-01-22 DIAGNOSIS — E2839 Other primary ovarian failure: Secondary | ICD-10-CM

## 2021-01-22 DIAGNOSIS — C50211 Malignant neoplasm of upper-inner quadrant of right female breast: Secondary | ICD-10-CM

## 2021-01-22 DIAGNOSIS — Z170 Estrogen receptor positive status [ER+]: Secondary | ICD-10-CM

## 2021-01-26 ENCOUNTER — Telehealth: Payer: Self-pay

## 2021-01-26 NOTE — Telephone Encounter (Signed)
Called and left VM for pt.  Per Marilyn Dalton pt has mild osteopenia and should begin supplements of calcium and vit d, as well as weight bearing exercises.  Instructed pt to call back with questions or concerns.

## 2021-02-28 ENCOUNTER — Other Ambulatory Visit: Payer: Self-pay | Admitting: Hematology and Oncology

## 2021-02-28 DIAGNOSIS — C50211 Malignant neoplasm of upper-inner quadrant of right female breast: Secondary | ICD-10-CM

## 2021-03-17 ENCOUNTER — Encounter: Payer: Medicare Other | Admitting: Nurse Practitioner

## 2021-07-12 ENCOUNTER — Telehealth: Payer: Self-pay | Admitting: Hematology and Oncology

## 2021-07-12 NOTE — Telephone Encounter (Signed)
Rescheduled appointment per provider request (virtual office visits only). Left message.

## 2021-08-06 ENCOUNTER — Ambulatory Visit: Payer: Medicare Other | Admitting: Hematology and Oncology

## 2021-08-16 NOTE — Progress Notes (Signed)
Patient Care Team: Nicholas Lose, MD as PCP - General (Hematology and Oncology)  DIAGNOSIS:  Encounter Diagnosis  Name Primary?   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%,HER-2 positive ratio 2.97   06/30/2014 Breast MRI   Right breast 2.4 x 2.2 x 2.2 cm irregular mass, no abnormal lymph nodes   07/14/2014 - 10/28/2014 Neo-Adjuvant Chemotherapy   TCH Perjeta every 3 weeks 6 followed by Herceptin maintenance   11/06/2014 Breast MRI   Right breast mass has significantly decreased in size and is less masslike, maximal enhancement is 1.5 cm previously 2.4 cm   12/02/2014 Surgery   Right lumpectomy: Residual IDC grade 3; 2.4 cm with DCIS high-grade, posterior margin probably positive for invasive cancer, 1/4 lymph nodes positive with extracapsular extension, T2 N1 stage IIB   12/29/2014 - 01/26/2015 Radiation Therapy   Adjuvant radiation therapy   02/24/2015 -  Anti-estrogen oral therapy   Anastrozole 1 mg daily stopped due to hot flashes, emotional disturbances, decreased energy and decreased appetite stopped 05/19/15. Letrozole restarted 05/22/16     CHIEF COMPLIANT: Follow-up of right breast cancer on letrozole   INTERVAL HISTORY: Marilyn Dalton is a 81 y.o. with above-mentioned history of right breast cancer on Letrozole. She presents to the clinic today for follow-up. She states that she is getting better. She sold her home and moved to friends home. She is trying to get in water aerobics pickle ball and art classes. She also likes to have conversations people. But overall she is adjusting with her new life style.    ALLERGIES:  has No Known Allergies.  MEDICATIONS:  Current Outpatient  Medications  Medication Sig Dispense Refill   amoxicillin (AMOXIL) 500 MG tablet Take 500 mg by mouth 3 (three) times daily.     b complex vitamins tablet Take 1 tablet by mouth daily.     letrozole (FEMARA) 2.5 MG tablet TAKE 1 TABLET(2.5 MG) BY MOUTH DAILY 90 tablet 3   naproxen (NAPROSYN) 500 MG tablet      potassium chloride (KLOR-CON) 10 MEQ tablet Take 10 mEq by mouth every morning.     venlafaxine XR (EFFEXOR-XR) 75 MG 24 hr capsule      vitamin E 400 UNIT capsule Take 400 Units by mouth daily.     No current facility-administered medications for this visit.    PHYSICAL EXAMINATION: ECOG PERFORMANCE STATUS: 1 - Symptomatic but completely ambulatory  Vitals:   08/30/21 1400  BP: 127/75  Pulse: 63  Resp: 17  Temp: 97.8 F (36.6 C)  SpO2: 100%   Filed Weights   08/30/21 1400  Weight: 103 lb 11.2 oz (47 kg)    BREAST: No palpable masses or nodules in either right or left breasts. No palpable axillary supraclavicular or infraclavicular adenopathy no breast tenderness or nipple discharge. (exam performed in the presence of a chaperone)  LABORATORY DATA:  I have reviewed the data as listed    Latest Ref Rng & Units 08/10/2020    9:10 AM 09/05/2019    9:42 AM 01/13/2019    2:17 AM  CMP  Glucose 70 - 99 mg/dL 88  98  99   BUN 8 - 23 mg/dL 10  7  5  Creatinine 0.44 - 1.00 mg/dL 0.69  0.63  0.56   Sodium 135 - 145 mmol/L 139  141  140   Potassium 3.5 - 5.1 mmol/L 4.1  4.0  2.9   Chloride 98 - 111 mmol/L 103  101  103   CO2 22 - 32 mmol/L 30  31  23    Calcium 8.9 - 10.3 mg/dL 9.8  9.6  9.2   Total Protein 6.5 - 8.1 g/dL 7.5  6.8    Total Bilirubin 0.3 - 1.2 mg/dL 0.7  0.8    Alkaline Phos 38 - 126 U/L 65  96    AST 15 - 41 U/L 18  36    ALT 0 - 44 U/L 13  19      Lab Results  Component Value Date   WBC 6.7 08/10/2020   HGB 15.7 (H) 08/10/2020   HCT 47.1 (H) 08/10/2020   MCV 93.8 08/10/2020   PLT 294 08/10/2020   NEUTROABS 3.9 08/10/2020    ASSESSMENT & PLAN:   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 Adjuvant antiestrogen therapy with anastrozole started January 2017 stopped March 2017 letrozole resumed 05/22/2017 2. Herceptin maintenance completed May 2017 Echocardiogram 05/20/2015: EF 60-65%   Letrozole toxicities: Hot flashes Patient husband passed away in 02/25/2018   Major depression: Well controlled on Effexor 75 mg daily   Breast cancer surveillance:  1. Annual mammograms and ultrasound 09/23/2020: Benign breast density category C Bone density 01/22/2021: T score -1.2 2. breast exam 08/30/2021: Benign   Patient sold her house and moved to a friend's home independent living facility.  She is having some difficulty adjusting to the new lifestyle. Return to clinic in 1 year for follow-up    No orders of the defined types were placed in this encounter.  The patient has a good understanding of the overall plan. she agrees with it. she will call with any problems that may develop before the next visit here. Total time spent: 30 mins including face to face time and time spent for planning, charting and co-ordination of care   Harriette Ohara, MD 08/30/21  I have reviewed the above documentation for accuracy and completeness, and I agree with the above.

## 2021-08-30 ENCOUNTER — Other Ambulatory Visit: Payer: Self-pay

## 2021-08-30 ENCOUNTER — Inpatient Hospital Stay: Payer: Medicare Other | Attending: Hematology and Oncology | Admitting: Hematology and Oncology

## 2021-08-30 DIAGNOSIS — Z923 Personal history of irradiation: Secondary | ICD-10-CM | POA: Insufficient documentation

## 2021-08-30 DIAGNOSIS — F17210 Nicotine dependence, cigarettes, uncomplicated: Secondary | ICD-10-CM | POA: Diagnosis not present

## 2021-08-30 DIAGNOSIS — Z170 Estrogen receptor positive status [ER+]: Secondary | ICD-10-CM

## 2021-08-30 DIAGNOSIS — N951 Menopausal and female climacteric states: Secondary | ICD-10-CM | POA: Insufficient documentation

## 2021-08-30 DIAGNOSIS — C773 Secondary and unspecified malignant neoplasm of axilla and upper limb lymph nodes: Secondary | ICD-10-CM | POA: Insufficient documentation

## 2021-08-30 DIAGNOSIS — F329 Major depressive disorder, single episode, unspecified: Secondary | ICD-10-CM | POA: Insufficient documentation

## 2021-08-30 DIAGNOSIS — C50211 Malignant neoplasm of upper-inner quadrant of right female breast: Secondary | ICD-10-CM | POA: Diagnosis present

## 2021-08-30 NOTE — Assessment & Plan Note (Signed)
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 Adjuvant antiestrogen therapy with anastrozole started January 2017 stopped March 2017letrozole resumed 05/22/2017 2. Herceptin maintenancecompleted May 2017 Echocardiogram 05/20/2015: EF 60-65%  Letrozole toxicities: Hot flashes Patient husbandpassed away in December 2019  Major depression:Well controlled on Effexor 75 mg daily  Breast cancer surveillance: 1.Annual mammogramsand ultrasound 09/23/2020: Benign breast density categoryC Bone density 01/22/2021: T score -1.2 2.breast exam 08/30/2021: Benign  Return to clinic in1 year for follow-up

## 2021-08-31 ENCOUNTER — Telehealth: Payer: Self-pay | Admitting: Hematology and Oncology

## 2021-08-31 NOTE — Telephone Encounter (Signed)
Scheduled appointment per 7/10 los. Left voicemail. 

## 2022-03-20 ENCOUNTER — Other Ambulatory Visit: Payer: Self-pay | Admitting: Hematology and Oncology

## 2022-03-20 DIAGNOSIS — Z170 Estrogen receptor positive status [ER+]: Secondary | ICD-10-CM

## 2022-08-24 ENCOUNTER — Telehealth: Payer: Self-pay | Admitting: Hematology and Oncology

## 2022-08-24 NOTE — Telephone Encounter (Signed)
Rescheduled appointment per [facility]. Left voicemail for patient.

## 2022-08-31 ENCOUNTER — Ambulatory Visit: Payer: Medicare Other | Admitting: Hematology and Oncology

## 2022-09-01 ENCOUNTER — Inpatient Hospital Stay: Payer: Medicare Other | Attending: Hematology and Oncology | Admitting: Adult Health

## 2023-01-03 ENCOUNTER — Inpatient Hospital Stay: Payer: Medicare Other | Attending: Hematology and Oncology | Admitting: Hematology and Oncology

## 2023-01-03 VITALS — BP 146/73 | HR 86 | Temp 97.2°F | Resp 18 | Ht 64.5 in | Wt 109.0 lb

## 2023-01-03 DIAGNOSIS — C50211 Malignant neoplasm of upper-inner quadrant of right female breast: Secondary | ICD-10-CM | POA: Insufficient documentation

## 2023-01-03 DIAGNOSIS — F329 Major depressive disorder, single episode, unspecified: Secondary | ICD-10-CM | POA: Insufficient documentation

## 2023-01-03 DIAGNOSIS — C773 Secondary and unspecified malignant neoplasm of axilla and upper limb lymph nodes: Secondary | ICD-10-CM | POA: Insufficient documentation

## 2023-01-03 DIAGNOSIS — Z170 Estrogen receptor positive status [ER+]: Secondary | ICD-10-CM | POA: Diagnosis present

## 2023-01-03 DIAGNOSIS — Z923 Personal history of irradiation: Secondary | ICD-10-CM | POA: Diagnosis not present

## 2023-01-03 NOTE — Progress Notes (Signed)
Patient Care Team: Serena Croissant, MD as PCP - General (Hematology and Oncology)  DIAGNOSIS:  Encounter Diagnosis  Name Primary?   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%,HER-2 positive ratio 2.97   06/30/2014 Breast MRI   Right breast 2.4 x 2.2 x 2.2 cm irregular mass, no abnormal lymph nodes   07/14/2014 - 10/28/2014 Neo-Adjuvant Chemotherapy   TCH Perjeta every 3 weeks 6 followed by Herceptin maintenance   11/06/2014 Breast MRI   Right breast mass has significantly decreased in size and is less masslike, maximal enhancement is 1.5 cm previously 2.4 cm   12/02/2014 Surgery   Right lumpectomy: Residual IDC grade 3; 2.4 cm with DCIS high-grade, posterior margin probably positive for invasive cancer, 1/4 lymph nodes positive with extracapsular extension, T2 N1 stage IIB   12/29/2014 - 01/26/2015 Radiation Therapy   Adjuvant radiation therapy   02/24/2015 -  Anti-estrogen oral therapy   Anastrozole 1 mg daily stopped due to hot flashes, emotional disturbances, decreased energy and decreased appetite stopped 05/19/15. Letrozole restarted 05/22/16     CHIEF COMPLIANT: Surveillance of breast cancer  HISTORY OF PRESENT ILLNESS:   History of Present Illness   The patient, with a history of breast cancer, has moved to a retirement  facility. She reports that she is generally healthy, but notes some changes in her heart rhythm and ongoing issues with her knee. She has a Baker's cyst on the back of her knee, which causes her discomfort, but she is not considering surgery at this time. She also mentions that she has been feeling less depressed recently. The patient has stopped taking all her previous  medications, including letrozole and venlafaxine. She maintains an active lifestyle and is focused on keeping her mind sharp. She expresses some dissatisfaction with the food and social environment at her new living facility but is adjusting to the changes.         ALLERGIES:  has No Known Allergies.  MEDICATIONS:  No current outpatient medications on file.   No current facility-administered medications for this visit.    PHYSICAL EXAMINATION: ECOG PERFORMANCE STATUS: 1 - Symptomatic but completely ambulatory  Vitals:   01/03/23 1431  BP: (!) 146/73  Pulse: 86  Resp: 18  Temp: (!) 97.2 F (36.2 C)  SpO2: 94%   Filed Weights   01/03/23 1431  Weight: 109 lb (49.4 kg)    Physical Exam   VITALS: BP- 146/?        LABORATORY DATA:  I have reviewed the data as listed    Latest Ref Rng & Units 08/10/2020    9:10 AM 09/05/2019    9:42 AM 01/13/2019    2:17 AM  CMP  Glucose 70 - 99 mg/dL 88  98  99   BUN 8 - 23 mg/dL 10  7  5    Creatinine 0.44 - 1.00 mg/dL 1.61  0.96  0.45   Sodium 135 - 145 mmol/L 139  141  140   Potassium 3.5 - 5.1 mmol/L 4.1  4.0  2.9   Chloride 98 - 111 mmol/L 103  101  103   CO2 22 - 32 mmol/L 30  31  23    Calcium 8.9 - 10.3 mg/dL 9.8  9.6  9.2   Total Protein 6.5 - 8.1 g/dL 7.5  6.8    Total Bilirubin 0.3 - 1.2 mg/dL 0.7  0.8    Alkaline Phos 38 - 126 U/L 65  96    AST 15 - 41 U/L 18  36    ALT 0 - 44 U/L 13  19      Lab Results  Component Value Date   WBC 6.7 08/10/2020   HGB 15.7 (H) 08/10/2020   HCT 47.1 (H) 08/10/2020   MCV 93.8 08/10/2020   PLT 294 08/10/2020   NEUTROABS 3.9 08/10/2020    ASSESSMENT & PLAN:  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 Adjuvant antiestrogen therapy with anastrozole started January 2017 stopped March 2017 letrozole resumed 05/22/2017 completed 01/03/2023 2. Herceptin maintenance completed May 2017 Echocardiogram 05/20/2015: EF 60-65%     Patient husband passed away in March 07, 2018  Major depression: Off Effexor   Breast cancer surveillance:  1. Annual mammograms and ultrasound 09/23/2020: Benign breast density category C Bone density 01/22/2021: T score -1.2 She does not want to do any further mammograms.   Patient sold her house and moved to a friends home independent living facility.     Return to clinic on an as-needed basis.       No orders of the defined types were placed in this encounter.  The patient has a good understanding of the overall plan. she agrees with it. she will call with any problems that may develop before the next visit here. Total time spent: 30 mins including face to face time and time spent for planning, charting and co-ordination of care   Tamsen Meek, MD 01/03/23

## 2023-01-03 NOTE — Assessment & Plan Note (Addendum)
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 Adjuvant antiestrogen therapy with anastrozole started January 2017 stopped March 2017 letrozole resumed 05/22/2017 completed 01/03/2023 2. Herceptin maintenance completed May 2017 Echocardiogram 05/20/2015: EF 60-65%     Patient husband passed away in Feb 11, 2018  Major depression: Well controlled on Effexor 75 mg daily   Breast cancer surveillance:  1. Annual mammograms and ultrasound 09/23/2020: Benign breast density category C Bone density 01/22/2021: T score -1.2 She does not want to do any further mammograms.   Patient sold her house and moved to a friends home independent living facility.     Return to clinic on an as-needed basis.

## 2023-09-04 ENCOUNTER — Encounter (HOSPITAL_BASED_OUTPATIENT_CLINIC_OR_DEPARTMENT_OTHER): Payer: Self-pay | Admitting: *Deleted

## 2023-09-04 ENCOUNTER — Emergency Department (HOSPITAL_BASED_OUTPATIENT_CLINIC_OR_DEPARTMENT_OTHER): Admitting: Radiology

## 2023-09-04 ENCOUNTER — Ambulatory Visit: Payer: Self-pay

## 2023-09-04 ENCOUNTER — Emergency Department (HOSPITAL_BASED_OUTPATIENT_CLINIC_OR_DEPARTMENT_OTHER)
Admission: EM | Admit: 2023-09-04 | Discharge: 2023-09-04 | Disposition: A | Discharge status: Home / Self Care | Source: Ambulatory Visit | Attending: Emergency Medicine | Admitting: Emergency Medicine

## 2023-09-04 ENCOUNTER — Other Ambulatory Visit: Payer: Self-pay

## 2023-09-04 DIAGNOSIS — K219 Gastro-esophageal reflux disease without esophagitis: Secondary | ICD-10-CM | POA: Insufficient documentation

## 2023-09-04 DIAGNOSIS — R0602 Shortness of breath: Secondary | ICD-10-CM | POA: Diagnosis present

## 2023-09-04 DIAGNOSIS — F17210 Nicotine dependence, cigarettes, uncomplicated: Secondary | ICD-10-CM | POA: Insufficient documentation

## 2023-09-04 DIAGNOSIS — I447 Left bundle-branch block, unspecified: Secondary | ICD-10-CM | POA: Diagnosis not present

## 2023-09-04 DIAGNOSIS — R079 Chest pain, unspecified: Secondary | ICD-10-CM | POA: Diagnosis present

## 2023-09-04 DIAGNOSIS — Z853 Personal history of malignant neoplasm of breast: Secondary | ICD-10-CM | POA: Diagnosis not present

## 2023-09-04 LAB — COMPREHENSIVE METABOLIC PANEL WITH GFR
ALT: 8 U/L (ref 0–44)
AST: 22 U/L (ref 15–41)
Albumin: 4.4 g/dL (ref 3.5–5.0)
Alkaline Phosphatase: 74 U/L (ref 38–126)
Anion gap: 12 (ref 5–15)
BUN: 9 mg/dL (ref 8–23)
CO2: 26 mmol/L (ref 22–32)
Calcium: 9.8 mg/dL (ref 8.9–10.3)
Chloride: 104 mmol/L (ref 98–111)
Creatinine, Ser: 0.7 mg/dL (ref 0.44–1.00)
GFR, Estimated: >60 mL/min (ref >60)
Glucose, Bld: 91 mg/dL (ref 70–99)
Potassium: 4 mmol/L (ref 3.5–5.1)
Sodium: 141 mmol/L (ref 135–145)
Total Bilirubin: 0.4 mg/dL (ref 0.0–1.2)
Total Protein: 7.1 g/dL (ref 6.5–8.1)

## 2023-09-04 LAB — PRO BRAIN NATRIURETIC PEPTIDE: Pro Brain Natriuretic Peptide: 214 pg/mL (ref <300.0)

## 2023-09-04 LAB — CBC
HCT: 47.1 % — ABNORMAL HIGH (ref 36.0–46.0)
Hemoglobin: 15.4 g/dL — ABNORMAL HIGH (ref 12.0–15.0)
MCH: 30.3 pg (ref 26.0–34.0)
MCHC: 32.7 g/dL (ref 30.0–36.0)
MCV: 92.7 fL (ref 80.0–100.0)
Platelets: 272 K/uL (ref 150–400)
RBC: 5.08 MIL/uL (ref 3.87–5.11)
RDW: 13.5 % (ref 11.5–15.5)
WBC: 7 K/uL (ref 4.0–10.5)
nRBC: 0 % (ref 0.0–0.2)

## 2023-09-04 LAB — PROTIME-INR
INR: 1 (ref 0.8–1.2)
Prothrombin Time: 13.5 s (ref 11.4–15.2)

## 2023-09-04 LAB — TROPONIN T, HIGH SENSITIVITY: Troponin T High Sensitivity: <15 ng/L (ref <19)

## 2023-09-04 MED ORDER — ALUM & MAG HYDROXIDE-SIMETH 200-200-20 MG/5ML PO SUSP
30.0000 mL | Freq: Once | ORAL | Status: AC
  Administered 2023-09-04: 30 mL via ORAL
  Filled 2023-09-04: qty 30

## 2023-09-04 MED ORDER — PANTOPRAZOLE SODIUM 20 MG PO TBEC: 20.0000 mg | DELAYED_RELEASE_TABLET | Freq: Every day | ORAL | 0 refills | Status: AC

## 2023-09-04 MED ORDER — PANTOPRAZOLE SODIUM 40 MG IV SOLR
40.0000 mg | Freq: Once | INTRAVENOUS | Status: AC
  Administered 2023-09-04: 40 mg via INTRAVENOUS
  Filled 2023-09-04: qty 10

## 2023-09-04 MED ORDER — ONDANSETRON HCL 4 MG/2ML IJ SOLN
4.0000 mg | Freq: Once | INTRAMUSCULAR | Status: AC
  Administered 2023-09-04: 4 mg via INTRAVENOUS
  Filled 2023-09-04: qty 2

## 2023-09-04 MED ORDER — ASPIRIN 81 MG PO CHEW
324.0000 mg | CHEWABLE_TABLET | Freq: Once | ORAL | Status: AC
  Administered 2023-09-04: 324 mg via ORAL
  Filled 2023-09-04: qty 4

## 2023-09-04 NOTE — Telephone Encounter (Signed)
 FYI Only or Action Required?: Action required by provider: request for appointment.  Patient was last seen in primary care on .  Called Nurse Triage reporting Vomiting.  Symptoms began several days ago.  Interventions attempted: Nothing.  Symptoms are: gradually worsening. Pt. Going to [facility], NO PCP.  Triage Disposition: See Physician Within 24 Hours  Patient/caregiver understands and will follow disposition?: Yes    Copied from CRM [id number]. Topic: Clinical - Red Word Triage >> Sep 04, 2023 11:31 AM Deaijah H wrote: Red Word that prompted transfer to Nurse Triage: Dagoberto friend of patient called in stating patient has had bad symptoms of acid reflux, vomiting and has gotten worse within the last 7 days Answer Assessment - Initial Assessment Questions 1. VOMITING SEVERITY: How many times have you vomited in the past 24 hours?      After dinner it happens - 2-3 x at night 2. ONSET: When did the vomiting begin?      1 week 3. FLUIDS: What fluids or food have you vomited up today? Have you been able to keep any fluids down?     Yes 4. ABDOMEN PAIN: Are your having any abdomen pain? If Yes : How bad is it and what does it feel like? (e.g., crampy, dull, intermittent, constant)      No, upper abdomen, burping 5. DIARRHEA: Is there any diarrhea? If Yes, ask: How many times today?      no 6. CONTACTS: Is there anyone else in the family with the same symptoms?      no 7. CAUSE: What do you think is causing your vomiting?     Reflux 8. HYDRATION STATUS: Any signs of dehydration? (e.g., dry mouth [not only dry lips], too weak to stand) When did you last urinate?     no 9. OTHER SYMPTOMS: Do you have any other symptoms? (e.g., fever, headache, vertigo, vomiting blood or coffee grounds, recent head injury)     no 10. PREGNANCY: Is there any chance you are pregnant? When was your last menstrual period?       no  Protocols used: Vomiting-A-AH

## 2023-09-04 NOTE — ED Provider Notes (Signed)
 Clinical Course as of 09/04/23 1602  Mon Sep 04, 2023  1434 D/w Dr. Anner on STEMI call about her EKG which shows incomplete LBBB, STEs anteriorly and diffuse STDs; she does have h/o LBBB. Dr. Anner states he would not call STEMI code on her. Will order labs. [HN]  1600 Received signout.  See prior team's note for full HPI.  Signed out pending lab workup.  Strong clinical suspicion for GERD/reflux.  Labs reassuring no fever or tachycardia or leukocytosis to suggest systemic infection.  Comprehensive panel with no electrolyte abnormalities.  Normal kidney function.  No transaminitis suggest hepatobiliary disease.  Bilirubin normal.  Cardiac etiology less likely given duration of symptoms as well as a negative troponin and negative BNP.  Chest x-ray reviewed, no pneumonia or pneumothorax.  Patient is feeling improved after GI cocktail, Protonix  and Zofran .  Discussed follow-up primary doctor and cardiology.  Agreeable to plan.  Stable for discharge. [TY]    Clinical Course User Index [HN] Franklyn Sid SAILOR, MD [TY] Neysa Caron PARAS, DO      Neysa Caron PARAS, [place] 09/04/23 [id number]

## 2023-09-04 NOTE — ED Triage Notes (Signed)
 Patient to ED POV reporting chest pain x 1 month worsening over the past week and vomiting when lying flat. Pain described as heaviness and pt reporting shortness of breath. No obvious increase in work of breathing noted.

## 2023-09-04 NOTE — Discharge Instructions (Signed)
 As discussed we are prescribing you Protonix  to help with your symptoms.  Try not to eat 2 hours before bed.  You may also use 6 inch blocks to raise the head of your bed up so you are in a more upright position as you sleep.  We would like you to follow-up with your primary doctor if symptoms persist.  Additionally, we are referring you to cardiology for evaluation of your EKG changes.  If you already have a cardiologist, you may schedule an appointment with them.  Please return if felt fevers, chills, lightheadedness, passout, worsening chest pain, shortness of breath or any new or worsening symptoms that are concerning to you.

## 2023-09-04 NOTE — ED Notes (Signed)
 Pt given discharge instructions and reviewed prescriptions. Opportunities given for questions. Pt verbalizes understanding. PIV removed x1. Jillyn Hidden, RN

## 2023-09-12 NOTE — ED Provider Notes (Signed)
 [place] [facility] AT [facility] Provider Note   CSN: [id number] Arrival date & time: 09/04/23  1411     History  Chief Complaint  Patient presents with   Chest Pain   Emesis    Marilyn Dalton is a 83 y.o. female with PMH as listed below who presents with burning and pressure chest pain x 1 month worsening over the past week and vomiting when lying flat. she states that the pain is also worse with lying flat and after she eats.  Denies any shortness of breath, cough, productive cough, hematemesis, diarrhea, hematochezia leg swelling, history of DVT or PE.  Does smoke less than 1 pack/day of cigarettes drinks alcohol  rarely.  No NSAIDs.   Past Medical History:  Diagnosis Date   Allergy    Arthritis    fingers   Breast cancer ([name]) 06/20/14   right breast   Diverticulitis    GERD (gastroesophageal reflux disease)    Personal history of chemotherapy    Personal history of radiation therapy        Home Medications Prior to Admission medications   Medication Sig Start Date End Date Taking? Authorizing Provider  pantoprazole  (PROTONIX ) 20 MG tablet Take 1 tablet (20 mg total) by mouth daily for 14 days. 09/04/23 09/18/23 Yes Neysa Caron PARAS, DO      Allergies    Patient has no known allergies.    Review of Systems   Review of Systems A 10 point review of systems was performed and is negative unless otherwise reported in HPI.  Physical Exam Updated Vital Signs BP (!) 149/90   Pulse 79   Temp (!) 97.4 F (36.3 C)   Resp 14   Ht 5' 5 (1.651 m)   Wt 48.5 kg   SpO2 98%   BMI 17.81 kg/m  Physical Exam General: Normal appearing female, lying in bed.  HEENT: , Sclera anicteric, MMM, trachea midline.  Cardiology: RRR, no murmurs/rubs/gallops. BL radial and DP pulses equal bilaterally.  Resp: Normal respiratory rate and effort. CTAB, no wheezes, rhonchi, crackles.  Abd: Soft, non-tender, non-distended. No rebound tenderness or guarding.   GU: Deferred. MSK: No peripheral edema or signs of trauma. Extremities without deformity or TTP. No cyanosis or clubbing. Skin: warm, dry.  Neuro: A&Ox4, CNs II-XII grossly intact. MAEs. Sensation grossly intact.  Psych: Normal mood and affect.   ED Results / Procedures / Treatments   Labs (all labs ordered are listed, but only abnormal results are displayed) Labs Reviewed  CBC - Abnormal; Notable for the following components:      Result Value   Hemoglobin 15.4 (*)    HCT 47.1 (*)    All other components within normal limits  COMPREHENSIVE METABOLIC PANEL WITH GFR  PROTIME-INR  PRO BRAIN NATRIURETIC PEPTIDE  TROPONIN T, HIGH SENSITIVITY    EKG EKG Interpretation Date/Time:  Monday September 04 2023 14:21:55 EDT Ventricular Rate:  119 PR Interval:  186 QRS Duration:  116 QT Interval:  378 QTC Calculation: 531 R Axis:   90  Text Interpretation: Sinus rhythm with frequent and consecutive Premature ventricular complexes Biatrial enlargement Left ventricular hypertrophy with QRS widening and repolarization abnormality ( Cornell product ) Anterolateral infarct , age undetermined Prolonged QT Abnormal ECG When compared with ECG of 02-Apr-2007 14:25, Significant changes have occurred Confirmed by Neysa Caron [phone]) on 09/04/2023 3:10:14 PM  Radiology No results found.  Procedures Procedures    Medications Ordered in ED Medications  aspirin  chewable tablet  324 mg (324 mg Oral Given 09/04/23 1531)  pantoprazole  (PROTONIX ) injection 40 mg (40 mg Intravenous Given 09/04/23 1532)  ondansetron  (ZOFRAN ) injection 4 mg (4 mg Intravenous Given 09/04/23 1531)  alum & mag hydroxide-simeth (MAALOX/MYLANTA) 200-200-20 MG/5ML suspension 30 mL (30 mLs Oral Given 09/04/23 1546)    ED Course/ Medical Decision Making/ A&P                          Medical Decision Making Amount and/or Complexity of Data Reviewed Labs: ordered. Radiology: ordered.  Risk OTC drugs. Prescription drug  management.    This patient presents to the ED for concern of chest pain/vomiting, this involves an extensive number of treatment options, and is a complaint that carries with it a high risk of complications and morbidity.  I considered the following differential and admission for this acute, potentially life threatening condition. Pt is overall very well-appearing, and HDS.  MDM:    DDX for chest pain includes but is not limited to:  EKG w/ anterior STEs and diffuse STDs. For this reason she was given 324 mg PO ASA. On discussion with cardiology and review of older EKGs, she does hve h/o LBBB, so these changes are not necessarily new. Still will obtain troponin/BNP, but overall very low suspicion for ACS, acute HF, or aortic dissection given presenting sx. EKG also not c/w pericarditis and no recent fevers/chills or viral syndromes. Patient cannot PERC out based on age, but will no signs/sxs of DVT and no SOB/hypoxia/tachycardia. Her sxs are more consistent with GERD/PUD/gastritis. Will treat w/ protonix , zofran , and then GI cocktail and reassess.   Clinical Course as of 09/12/23 1120  Mon Sep 04, 2023  1434 D/w Dr. Anner on STEMI call about her EKG which shows incomplete LBBB, STEs anteriorly and diffuse STDs; she does have h/o LBBB. Dr. Anner states he would not call STEMI code on her. Will order labs. [HN]  1600 Received signout.  See prior team's note for full HPI.  Signed out pending lab workup.  Strong clinical suspicion for GERD/reflux.  Labs reassuring no fever or tachycardia or leukocytosis to suggest systemic infection.  Comprehensive panel with no electrolyte abnormalities.  Normal kidney function.  No transaminitis suggest hepatobiliary disease.  Bilirubin normal.  Cardiac etiology less likely given duration of symptoms as well as a negative troponin and negative BNP.  Chest x-ray reviewed, no pneumonia or pneumothorax.  Patient is feeling improved after GI cocktail, Protonix  and  Zofran .  Discussed follow-up primary doctor and cardiology.  Agreeable to plan.  Stable for discharge. [TY]    Clinical Course User Index [HN] Franklyn Sid SAILOR, MD [TY] Neysa Caron PARAS, DO    Labs: I Ordered, and personally interpreted labs.  The pertinent results include:  those listed above  Imaging Studies ordered: I ordered imaging studies including CXR I independently visualized and interpreted imaging. I agree with the radiologist interpretation  Additional history obtained from chart review.   Cardiac Monitoring: The patient was maintained on a cardiac monitor.  I personally viewed and interpreted the cardiac monitored which showed an underlying rhythm of: NSR  Social Determinants of Health: Lives independently  Disposition:  Patient is signed out to the oncoming ED physician who is made aware of her history, presentation, exam, workup, and plan.    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    Dispense:  14 tablet    Refill:  0    I have reviewed the patients home medicines and have made adjustments as needed  Problem List / ED Course: Problem List Items Addressed This Visit       Cardiovascular and Mediastinum   LBBB (left bundle branch block)   Relevant Orders   Ambulatory referral to Cardiology   Other Visit Diagnoses        Gastroesophageal reflux disease, unspecified whether esophagitis present    -  Primary   Relevant Medications   pantoprazole  (PROTONIX ) injection 40 mg (Completed)   ondansetron  (ZOFRAN ) injection 4 mg (Completed)   alum & mag hydroxide-simeth (MAALOX/MYLANTA) 200-200-20 MG/5ML suspension 30 mL (Completed)   pantoprazole  (PROTONIX ) 20 MG tablet                   This note was created using dictation software, which may contain spelling or grammatical errors.    Franklyn Sid SAILOR, MD 09/12/23 [phone]

## 2023-09-14 ENCOUNTER — Encounter: Payer: Self-pay | Admitting: Nurse Practitioner

## 2023-09-14 ENCOUNTER — Ambulatory Visit: Attending: Nurse Practitioner | Admitting: Nurse Practitioner

## 2023-09-14 VITALS — BP 128/72 | HR 92 | Ht 65.0 in | Wt 107.6 lb

## 2023-09-14 DIAGNOSIS — R42 Dizziness and giddiness: Secondary | ICD-10-CM

## 2023-09-14 DIAGNOSIS — R072 Precordial pain: Secondary | ICD-10-CM | POA: Insufficient documentation

## 2023-09-14 DIAGNOSIS — I447 Left bundle-branch block, unspecified: Secondary | ICD-10-CM | POA: Diagnosis present

## 2023-09-14 DIAGNOSIS — I951 Orthostatic hypotension: Secondary | ICD-10-CM | POA: Diagnosis present

## 2023-09-14 DIAGNOSIS — R0609 Other forms of dyspnea: Secondary | ICD-10-CM | POA: Insufficient documentation

## 2023-09-14 DIAGNOSIS — Z853 Personal history of malignant neoplasm of breast: Secondary | ICD-10-CM | POA: Insufficient documentation

## 2023-09-14 DIAGNOSIS — I340 Nonrheumatic mitral (valve) insufficiency: Secondary | ICD-10-CM | POA: Insufficient documentation

## 2023-09-14 DIAGNOSIS — K219 Gastro-esophageal reflux disease without esophagitis: Secondary | ICD-10-CM | POA: Insufficient documentation

## 2023-09-14 MED ORDER — METOPROLOL TARTRATE 100 MG PO TABS: ORAL_TABLET | ORAL | 0 refills | Status: DC

## 2023-09-14 NOTE — Progress Notes (Signed)
 Office Visit    Patient Name: Marilyn Dalton Date of Encounter: 09/14/2023  Primary Care Provider:  Chrystal Lamarr RAMAN, MD Primary Cardiologist:  Shelda Bruckner, MD  Chief Complaint    83 year old female with a history of LBBB, mitral valve regurgitation, orthostatic hypotension, GERD, vertigo, breast cancer s/p right breast lumpectomy in 2016, arthritis, and tobacco use who presents for heart first clinic new patient visit to reestablish care and for follow-up related to chest pain, dyspnea on exertion.   Past Medical History    Past Medical History:  Diagnosis Date   Allergy    Arthritis    fingers   Breast cancer ([name]) 06/20/14   right breast   Diverticulitis    GERD (gastroesophageal reflux disease)    Personal history of chemotherapy    Personal history of radiation therapy    Past Surgical History:  Procedure Laterality Date   ABDOMINAL HYSTERECTOMY  ~ 40 years ago   APPENDECTOMY     BREAST BIOPSY Left 10/6/210   no malignancy,extensive stromal fibrosis   BREAST BIOPSY Right 06/20/14   invasive ductal ca,dcis    BREAST LUMPECTOMY Right    BREAST LUMPECTOMY WITH RADIOACTIVE SEED AND SENTINEL LYMPH NODE BIOPSY Right 12/02/2014   Procedure: RIGHT BREAST LUMPECTOMY WITH RADIOACTIVE SEED LOCALIZATION AND SENTINEL LYMPH NODE BIOPSY;  Surgeon: Morene Olives, MD;  Location: [facility];  Service: General;  Laterality: Right;   COLON SURGERY  10/13/08   cecum polyp=adenomatous ,no high grade dysplasia or invasive malignancy   HAND SURGERY     RIGHT   KNEE ARTHROSCOPY     RT   PORTACATH PLACEMENT Left 07/11/2014   Procedure: INSERTION PORT-A-CATH;  Surgeon: Morene Olives, MD;  Location: [facility];  Service: General;  Laterality: Left;   TONSILLECTOMY      Allergies  No Known Allergies   Labs/Other Studies Reviewed    The following studies were reviewed today:  Cardiac Studies & Procedures    ______________________________________________________________________________________________     ECHOCARDIOGRAM  ECHOCARDIOGRAM COMPLETE 10/09/2017  Narrative *[facility]* *[facility]* [address] [place], [place] [address] [phone]  ------------------------------------------------------------------- Transthoracic Echocardiography  Patient:    Marilyn, Dalton MR #:       [id number] Study Date: 10/09/2017 Gender:     F Age:        79 Height:     165.1 cm Weight:     49.4 kg BSA:        1.5 m^2 Pt. Status: Room:  PERFORMING   [facility], Outpatient SONOGRAPHER  Annabella Fell, RDCS ATTENDING    Bruckner, Bridgette TISA Bruckner, Bridgette REFERRING    Bruckner, Bridgette  cc:  ------------------------------------------------------------------- LV EF: 50% -   55%  ------------------------------------------------------------------- Indications:      Hypotension.  ------------------------------------------------------------------- History:   PMH:  Lightheaded. Left Bundle Branch Block.  PMH: Breast Cancer.  Risk factors:  Current tobacco use.  ------------------------------------------------------------------- Study Conclusions  - Left ventricle: The cavity size was normal. Wall thickness was normal. Systolic function was normal. The estimated ejection fraction was in the range of 50% to 55%. Wall motion was normal; there were no regional wall motion abnormalities. Doppler parameters are consistent with abnormal left ventricular relaxation (grade 1 diastolic dysfunction). GLS: -18.7% - Ventricular septum: Septal motion showed abnormal function and dyssynergy. - Aorta: The aorta was mildly calcified. - Mitral valve: There was mild regurgitation.  ------------------------------------------------------------------- Labs, prior tests, procedures, and surgery: ECG.  Abnormal.  ------------------------------------------------------------------- Study data:  Comparison was made to the study of 08/31/2015.  Study status:  Routine.  Procedure:  The patient reported no pain pre or post test. Transthoracic echocardiography. Image quality was adequate.  Study completion:  There were no complications. Transthoracic echocardiography.  M-mode, complete 2D, spectral Doppler, and color Doppler.  Birthdate:  Patient birthdate: [birth year].  Age:  Patient is 83 yr old.  Sex:  Gender: female. BMI: 18.1 kg/m^2.  Blood pressure:     116/64  Patient status: Outpatient.  Study date:  Study date: 10/09/2017. Study time: 01:02 PM.  Location:  Echo laboratory.  -------------------------------------------------------------------  ------------------------------------------------------------------- Left ventricle:  The cavity size was normal. Wall thickness was normal. Systolic function was normal. The estimated ejection fraction was in the range of 50% to 55%. Wall motion was normal; there were no regional wall motion abnormalities. GLS: -18.7% Doppler parameters are consistent with abnormal left ventricular relaxation (grade 1 diastolic dysfunction).  ------------------------------------------------------------------- Aortic valve:   Trileaflet; normal thickness leaflets. Mobility was not restricted.  Doppler:  Transvalvular velocity was within the normal range. There was no stenosis. There was no regurgitation.  ------------------------------------------------------------------- Aorta:  The aorta was mildly calcified.  ------------------------------------------------------------------- Mitral valve:   Structurally normal valve.   Mobility was not restricted.  Doppler:  Transvalvular velocity was within the normal range. There was no evidence for stenosis. There was mild regurgitation.  ------------------------------------------------------------------- Left  atrium:  The atrium was normal in size.  ------------------------------------------------------------------- Right ventricle:  The cavity size was normal. Wall thickness was normal. Systolic function was normal.  ------------------------------------------------------------------- Ventricular septum:   Septal motion showed abnormal function and dyssynergy.  ------------------------------------------------------------------- Pulmonic valve:   Poorly visualized.  Structurally normal valve. Cusp separation was normal.  Doppler:  Transvalvular velocity was within the normal range. There was no evidence for stenosis. There was no regurgitation.  ------------------------------------------------------------------- Tricuspid valve:   Structurally normal valve.    Doppler: Transvalvular velocity was within the normal range. There was mild regurgitation.  ------------------------------------------------------------------- Pulmonary artery:   The main pulmonary artery was normal-sized. Systolic pressure was within the normal range.  ------------------------------------------------------------------- Right atrium:  The atrium was normal in size.  ------------------------------------------------------------------- Pericardium:  There was no pericardial effusion.  ------------------------------------------------------------------- Systemic veins: Inferior vena cava: The vessel was normal in size.  ------------------------------------------------------------------- Post procedure conclusions Ascending Aorta:  - The aorta was mildly calcified.  ------------------------------------------------------------------- Measurements  Left ventricle                        Value        Reference LV ID, ED, PLAX chordal        (L)    35.2  mm     43 - 52 LV ID, ES, PLAX chordal               32.5  mm     23 - 38 LV fx shortening, PLAX chordal (L)    8     %      >=29 LV PW thickness, ED                    8.71  mm     ---------- IVS/LV PW ratio, ED            (H)    1.35         <=1.3 Stroke volume, 2D  40    ml     ---------- Stroke volume/bsa, 2D                 27    ml/m^2 ---------- LV e&', lateral                        4.57  cm/s   ---------- LV E/e&', lateral                      10.96        ---------- LV e&', medial                         3.37  cm/s   ---------- LV E/e&', medial                       14.87        ---------- LV e&', average                        3.97  cm/s   ---------- LV E/e&', average                      12.62        ---------- Longitudinal strain, TDI              19    %      ----------  Ventricular septum                    Value        Reference IVS thickness, ED                     11.8  mm     ----------  LVOT                                  Value        Reference LVOT ID, S                            20    mm     ---------- LVOT area                             3.14  cm^2   ---------- LVOT peak velocity, S                 77    cm/s   ---------- LVOT mean velocity, S                 50.2  cm/s   ---------- LVOT VTI, S                           12.6  cm     ----------  Aorta                                 Value        Reference Aortic root ID, ED                    27  mm     ---------- Ascending aorta ID, A-P, S            30    mm     ----------  Left atrium                           Value        Reference LA ID, A-P, ES                        23    mm     ---------- LA ID/bsa, A-P                        1.54  cm/m^2 <=2.2 LA volume, S                          17.6  ml     ---------- LA volume/bsa, S                      11.8  ml/m^2 ---------- LA volume, ES, 1-p A4C                6.7   ml     ---------- LA volume/bsa, ES, 1-p A4C            4.5   ml/m^2 ---------- LA volume, ES, 1-p A2C                39    ml     ---------- LA volume/bsa, ES, 1-p A2C            26.1  ml/m^2 ----------  Mitral valve                           Value        Reference Mitral E-wave peak velocity           50.1  cm/s   ---------- Mitral A-wave peak velocity           77.1  cm/s   ---------- Mitral deceleration time       (L)    76    ms     150 - 230 Mitral E/A ratio, peak                0.6          ----------  Pulmonary arteries                    Value        Reference PA pressure, S, DP                    27    mm Hg  <=30  Tricuspid valve                       Value        Reference Tricuspid regurg peak velocity        246   cm/s   ---------- Tricuspid peak RV-RA gradient         24    mm Hg  ----------  Right atrium                          Value  Reference RA ID, S-I, ES, A4C                   38.4  mm     34 - 49 RA area, ES, A4C                      10.6  cm^2   8.3 - 19.5 RA volume, ES, A/L                    24.4  ml     ---------- RA volume/bsa, ES, A/L                16.3  ml/m^2 ----------  Systemic veins                        Value        Reference Estimated CVP                         3     mm Hg  ----------  Right ventricle                       Value        Reference TAPSE                                 22.6  mm     ---------- RV pressure, S, DP                    27    mm Hg  <=30 RV s&', lateral, S                     9.68  cm/s   ----------  Legend: (L)  and  (H)  mark values outside specified reference range.  ------------------------------------------------------------------- Prepared and Electronically Authenticated by  Oneil Parchment, M.D. 2019-08-19T14:15:28          ______________________________________________________________________________________________     Recent Labs: 09/04/2023: ALT 8; BUN 9; Creatinine, Ser 0.70; Hemoglobin 15.4; Platelets 272; Potassium 4.0; Pro Brain Natriuretic Peptide 214.0; Sodium 141  Recent Lipid Panel    Component Value Date/Time   CHOL 210 (H) 09/05/2019 0942   TRIG 91.0 09/05/2019 0942   HDL 85.50 09/05/2019 0942   CHOLHDL 2  09/05/2019 0942   VLDL 18.2 09/05/2019 0942   LDLCALC 106 (H) 09/05/2019 0942    History of Present Illness    83 year old female with the above past medical history including LBBB, mitral valve regurgitation, orthostatic hypotension, GERD, vertigo, breast cancer s/p right breast lumpectomy in 2016, arthritis, and tobacco use.  She has presents today for first clinic new patient evaluation to reestablish care, accompanied by her friend/former neighbor.  She was previously evaluated by Dr. Lonni, last seen in 2019. Echocardiogram in 09/2017 showed EF 50 to 55%, normal LV function, no RWMA, G1 DD, normal RV systolic function, mild mitral valve regurgitation. She was evaluated in the ED on 09/04/2023 in the setting of chest pressure x 1 month.  Troponin was negative, BNP was normal, chest x-ray was unremarkable. EKG showed LBBB, concern for diffuse ST elevation. This was reviewed with Dr. Anner, outpatient follow-up with cardiology was recommended. She was given a GI cocktail with improvement in her symptoms. She reports a 2 to 67-month history of difficulty  eating, intermittent vomiting, difficulty breathing, burning/heaviness/tightness in her chest, worse after eating and drinking. She also reports mild dyspnea on exertion, stable mild orthostatic dizziness.  She denies any significant palpitations, presyncope, syncope. She has a 65-year history of smoking, she currently smokes 2 cigarettes a day, drinks 2 glasses of wine a day.  Her mother had a stroke. She is originally from [place]. She recently moved into [facility] independent living.     Home Medications    Current Outpatient Medications  Medication Sig Dispense Refill   metoprolol  tartrate (LOPRESSOR ) 100 MG tablet Take 1 tablet 2 hours prior to cardiac ct 1 tablet 0   pantoprazole  (PROTONIX ) 20 MG tablet Take 1 tablet (20 mg total) by mouth daily for 14 days. 14 tablet 0   No current facility-administered medications for this visit.      Review of Systems    She denies chest pain, palpitations, dyspnea, pnd, orthopnea, n, v, dizziness, syncope, edema, weight gain, or early satiety. All other systems reviewed and are otherwise negative except as noted above.   Physical Exam    VS:  BP 128/72   Pulse 92   Ht 5' 5 (1.651 m)   Wt 107 lb 9.6 oz (48.8 kg)   SpO2 92%   BMI 17.91 kg/m   GEN: Well nourished, well developed, in no acute distress. HEENT: normal. Neck: Supple, no JVD, carotid bruits, or masses. Cardiac: RRR, no murmurs, rubs, or gallops. No clubbing, cyanosis, edema.  Radials/DP/PT 2+ and equal bilaterally.  Respiratory:  Respirations regular and unlabored, clear to auscultation bilaterally. GI: Soft, nontender, nondistended, BS + x 4. MS: no deformity or atrophy. Skin: warm and dry, no rash. Neuro:  Strength and sensation are intact. Psych: Normal affect.  Accessory Clinical Findings    ECG personally reviewed by me today - EKG Interpretation Date/Time:  Thursday September 14 2023 10:00:13 EDT Ventricular Rate:  92 PR Interval:  148 QRS Duration:  132 QT Interval:  386 QTC Calculation: 477 R Axis:   -58  Text Interpretation: Normal sinus rhythm Biatrial enlargement Left axis deviation Left ventricular hypertrophy with QRS widening and repolarization abnormality ( Cornell product ) Cannot rule out Septal infarct (cited on or before 04-Sep-2023) When compared with ECG of 04-Sep-2023 14:21, No significant change since last tracing Confirmed by Daneen Perkins ([id number]) on 09/14/2023 10:06:33 AM  - no acute changes.   Lab Results  Component Value Date   WBC 7.0 09/04/2023   HGB 15.4 (H) 09/04/2023   HCT 47.1 (H) 09/04/2023   MCV 92.7 09/04/2023   PLT 272 09/04/2023   Lab Results  Component Value Date   CREATININE 0.70 09/04/2023   BUN 9 09/04/2023   NA 141 09/04/2023   K 4.0 09/04/2023   CL 104 09/04/2023   CO2 26 09/04/2023   Lab Results  Component Value Date   ALT 8 09/04/2023   AST 22  09/04/2023   ALKPHOS 74 09/04/2023   BILITOT 0.4 09/04/2023   Lab Results  Component Value Date   CHOL 210 (H) 09/05/2019   HDL 85.50 09/05/2019   LDLCALC 106 (H) 09/05/2019   TRIG 91.0 09/05/2019   CHOLHDL 2 09/05/2019    No results found for: HGBA1C  Assessment & Plan    1. Chest pain/LBBB: She reports a several month 2 to 52-month history of difficulty eating, intermittent vomiting, difficulty breathing, burning/heaviness/tightness in her chest, worse after eating and drinking. She also reports mild dyspnea on exertion.  Symptoms possibly related  to GERD, however, she does have several risk factors for CAD.  Through shared decision making, will pursue coronary CT angiogram for further risk stratification, will check echocardiogram given mild dyspnea on exertion.  2. Mitral valve regurgitation: Echocardiogram in 09/2017 showed EF 50 to 55%, normal LV function, no RWMA, G1 DD, normal RV systolic function, mild mitral valve regurgitation.  She does report mild dyspnea on exertion.  Euvolemic and well compensated on exam.  Will repeat echocardiogram.  3. Orthostatic hypertension: She reports stable mild orthostatic dizziness, denies any significant palpitations, presyncope, syncope.  BP stable in office today.  Encourage adequate hydration, gradual position changes.  4. GERD: She reports burning in her chest, worse after eating and drinking, symptoms suspicious for GERD.  If cardiac workup unremarkable, recommend follow-up with GI.  Continue pantoprazole .  5. History of breast cancer: S/p lumpectomy in 2016.  Followed by oncology.  6. Tobacco use: She has smoked for 65 years.  She currently only smokes 2 cigarettes a day.  Full cessation advised.  7. Disposition: Follow-up in 6 to 8 weeks at our [place] location.   Damien JAYSON Braver, NP 09/17/2023, 1:01 PM

## 2023-09-14 NOTE — Patient Instructions (Signed)
 Medication Instructions:  Metoprolol  Tartrate 100 mg 1 tablet 2 hours prior to cardiac ct  *If you need a refill on your cardiac medications before your next appointment, please call your pharmacy*  Lab Work: NONE ordered at this time of appointment   Testing/Procedures: Your physician has requested that you have an echocardiogram. Echocardiography is a painless test that uses sound waves to create images of your heart. It provides your doctor with information about the size and shape of your heart and how well your heart's chambers and valves are working. This procedure takes approximately one hour. There are no restrictions for this procedure. Please do NOT wear cologne, perfume, aftershave, or lotions (deodorant is allowed). Please arrive 15 minutes prior to your appointment time.  Please note: We ask at that you not bring children with you during ultrasound (echo/ vascular) testing. Due to room size and safety concerns, children are not allowed in the ultrasound rooms during exams. Our front office staff cannot provide observation of children in our lobby area while testing is being conducted. An adult accompanying a patient to their appointment will only be allowed in the ultrasound room at the discretion of the ultrasound technician under special circumstances. We apologize for any inconvenience.    Your cardiac CT will be scheduled at one of the below locations:   [facility] [address]  [place]  If scheduled at [facility], please arrive at the [facility] and Children's Entrance (Entrance C2) of [facility] 30 minutes prior to test start time. You can use the FREE valet parking offered at entrance C (encouraged to control the heart rate for the test)  Proceed to the [facility] Radiology Department (first floor) to check-in and test prep.  All radiology patients and guests should use entrance C2 at [facility], accessed from  [facility], even though the hospital's physical address listed is [address]  If scheduled at the Heart and Vascular Tower at [address], please enter the parking lot using the [address] entrance and use the FREE valet service at the patient drop-off area. Enter the buidling and check-in with registration on the main floor.  If scheduled at [facility] or [facility], please arrive 15 mins early for check-in and test prep.  There is spacious parking and easy access to the radiology department from the [facility] entrance. Please enter here and check-in with the desk attendant.   If scheduled at [facility], please arrive 30 minutes early for check-in and test prep.  Please follow these instructions carefully (unless otherwise directed):  An IV will be required for this test and Nitroglycerin will be given.  Hold all erectile dysfunction medications at least 3 days (72 hrs) prior to test. (Ie viagra, cialis, sildenafil, tadalafil, etc)   On the Night Before the Test: Be sure to Drink plenty of water. Do not consume any caffeinated/decaffeinated beverages or chocolate 12 hours prior to your test. Do not take any antihistamines 12 hours prior to your test. If the patient has contrast allergy: Patient will need a prescription for Prednisone  and very clear instructions (as follows): Prednisone  50 mg - take 13 hours prior to test Take another Prednisone  50 mg 7 hours prior to test Take another Prednisone  50 mg 1 hour prior to test Take Benadryl  50 mg 1 hour prior to test Patient must complete all four doses of above prophylactic  medications. Patient will need a ride after test due to Benadryl .  On the Day of the Test: Drink plenty of water until 1 hour prior to the test. Do not eat any food 1 hour prior to test. You may take your regular medications prior to the test.  Take metoprolol   (Lopressor ) two hours prior to test. If you take Furosemide/Hydrochlorothiazide/Spironolactone/Chlorthalidone, please HOLD on the morning of the test. Patients who wear a continuous glucose monitor MUST remove the device prior to scanning. FEMALES- please wear underwire-free bra if available, avoid dresses & tight clothing  After the Test: Drink plenty of water. After receiving IV contrast, you may experience a mild flushed feeling. This is normal. On occasion, you may experience a mild rash up to 24 hours after the test. This is not dangerous. If this occurs, you can take Benadryl  25 mg, Zyrtec, Claritin, or Allegra and increase your fluid intake. (Patients taking Tikosyn should avoid Benadryl , and may take Zyrtec, Claritin, or Allegra) If you experience trouble breathing, this can be serious. If it is severe call 911 IMMEDIATELY. If it is mild, please call our office.  We will call to schedule your test 2-4 weeks out understanding that some insurance companies will need an authorization prior to the service being performed.   For more information and frequently asked questions, please visit our website : [email or web address]  For non-scheduling related questions, please contact the cardiac imaging nurse navigator should you have any questions/concerns: Cardiac Imaging Nurse Navigators Direct Office Dial: [phone]   For scheduling needs, including cancellations and rescheduling, please call [place], [phone].    Follow-Up: At [facility], you and your health needs are our priority.  As part of our continuing mission to provide you with exceptional heart care, our providers are all part of one team.  This team includes your primary Cardiologist (physician) and Advanced Practice Providers or APPs (Physician Assistants and Nurse Practitioners) who all work together to provide you with the care you need, when you need it.  Your net appointment:   6-8  week(s)  Provider:   Shelda Bruckner, MD or Reche Finder, NP or Damien Braver NP   We recommend signing up for the patient portal called MyChart.  Sign up information is provided on this After Visit Summary.  MyChart is used to connect with patients for Virtual Visits (Telemedicine).  Patients are able to view lab/test results, encounter notes, upcoming appointments, etc.  Non-urgent messages can be sent to your provider as well.   To learn more about what you can do with MyChart, go to [email or web address].

## 2023-09-17 ENCOUNTER — Encounter: Payer: Self-pay | Admitting: Nurse Practitioner

## 2023-09-19 ENCOUNTER — Encounter (HOSPITAL_COMMUNITY): Payer: Self-pay

## 2023-09-22 ENCOUNTER — Ambulatory Visit (HOSPITAL_COMMUNITY)
Admission: RE | Admit: 2023-09-22 | Discharge: 2023-09-22 | Disposition: A | Discharge status: Home / Self Care | Source: Ambulatory Visit | Attending: Nurse Practitioner | Admitting: Nurse Practitioner

## 2023-09-22 DIAGNOSIS — R931 Abnormal findings on diagnostic imaging of heart and coronary circulation: Secondary | ICD-10-CM | POA: Diagnosis present

## 2023-09-22 DIAGNOSIS — R0609 Other forms of dyspnea: Secondary | ICD-10-CM | POA: Diagnosis present

## 2023-09-22 DIAGNOSIS — I2510 Atherosclerotic heart disease of native coronary artery without angina pectoris: Secondary | ICD-10-CM

## 2023-09-22 DIAGNOSIS — R072 Precordial pain: Secondary | ICD-10-CM | POA: Insufficient documentation

## 2023-09-22 MED ORDER — NITROGLYCERIN 0.4 MG SL SUBL
0.8000 mg | SUBLINGUAL_TABLET | Freq: Once | SUBLINGUAL | Status: AC
  Administered 2023-09-22: 0.8 mg via SUBLINGUAL

## 2023-09-22 MED ORDER — IOHEXOL 350 MG/ML SOLN
100.0000 mL | Freq: Once | INTRAVENOUS | Status: AC | PRN
  Administered 2023-09-22: 100 mL via INTRAVENOUS

## 2023-09-24 ENCOUNTER — Ambulatory Visit (HOSPITAL_BASED_OUTPATIENT_CLINIC_OR_DEPARTMENT_OTHER)
Admission: RE | Admit: 2023-09-24 | Discharge: 2023-09-24 | Disposition: A | Discharge status: Home / Self Care | Source: Ambulatory Visit | Attending: Cardiology | Admitting: Cardiology

## 2023-09-24 ENCOUNTER — Other Ambulatory Visit: Payer: Self-pay | Admitting: Cardiology

## 2023-09-24 DIAGNOSIS — R931 Abnormal findings on diagnostic imaging of heart and coronary circulation: Secondary | ICD-10-CM | POA: Diagnosis not present

## 2023-09-25 ENCOUNTER — Ambulatory Visit (HOSPITAL_BASED_OUTPATIENT_CLINIC_OR_DEPARTMENT_OTHER): Payer: Self-pay | Admitting: Family

## 2023-09-26 ENCOUNTER — Other Ambulatory Visit: Payer: Self-pay

## 2023-09-26 DIAGNOSIS — Z79899 Other long term (current) drug therapy: Secondary | ICD-10-CM

## 2023-09-26 DIAGNOSIS — I350 Nonrheumatic aortic (valve) stenosis: Secondary | ICD-10-CM

## 2023-09-26 MED ORDER — ROSUVASTATIN CALCIUM 10 MG PO TABS: 10.0000 mg | ORAL_TABLET | Freq: Every day | ORAL | 3 refills | Status: AC

## 2023-10-12 ENCOUNTER — Other Ambulatory Visit (HOSPITAL_BASED_OUTPATIENT_CLINIC_OR_DEPARTMENT_OTHER): Payer: Self-pay | Admitting: Family Medicine

## 2023-10-12 DIAGNOSIS — M8589 Other specified disorders of bone density and structure, multiple sites: Secondary | ICD-10-CM

## 2023-10-18 ENCOUNTER — Ambulatory Visit (HOSPITAL_COMMUNITY)
Admission: RE | Admit: 2023-10-18 | Discharge: 2023-10-18 | Disposition: A | Discharge status: Home / Self Care | Source: Ambulatory Visit | Attending: Cardiology | Admitting: Cardiology

## 2023-10-18 ENCOUNTER — Telehealth: Payer: Self-pay

## 2023-10-18 DIAGNOSIS — I447 Left bundle-branch block, unspecified: Secondary | ICD-10-CM | POA: Diagnosis present

## 2023-10-18 DIAGNOSIS — R0609 Other forms of dyspnea: Secondary | ICD-10-CM | POA: Insufficient documentation

## 2023-10-18 LAB — ECHOCARDIOGRAM COMPLETE
Area-P 1/2: 2.9 cm2
S' Lateral: 2.54 cm

## 2023-10-18 NOTE — Telephone Encounter (Signed)
   Pre-operative Risk Assessment    Patient Name: Marilyn Dalton  DOB: 23-Sep-1940 MRN: [id number]   Date of last office visit: 09/14/23 DAMIEN BRAVER, NP Date of next office visit: 11/27/23 DAMIEN BRAVER, NP   Request for Surgical Clearance    Procedure:  ENDOSCOPY  Date of Surgery:  Clearance 11/13/23                                Surgeon:  DR ESTELITA MANAS Surgeon's Group or Practice Name:  [facility] Phone number:  [phone] Fax number:  [phone]   Type of Clearance Requested:   - Medical    Type of Anesthesia:  Not Indicated   Additional requests/questions:    SignedLucie DELENA Ku   10/18/2023, 5:41 PM

## 2023-10-18 NOTE — Telephone Encounter (Signed)
 Hi [name],  We have received a surgical clearance request for Marilyn Dalton for endoscopy procedure. They were seen recently in clinic on 09/10/2023 and underwent CTA as well as echo due to recent symptoms. Can you please comment on surgical clearance for upcoming endoscopy. Please forward you guidance and recommendations to P [facility] PREOP   Thanks, Jackee

## 2023-10-19 NOTE — Telephone Encounter (Signed)
   Patient Name: Marilyn Dalton  DOB: 29-Mar-1940 MRN: [id number]  Primary Cardiologist: Shelda Bruckner, MD  Chart reviewed as part of pre-operative protocol coverage. Recent coronary CTA revealed nonobstructive coronary artery disease, recent echocardiogram was overall normal.  Therefore, given past medical history and time since last visit, based on ACC/AHA guidelines, Marilyn Dalton is at acceptable risk for the planned procedure without further cardiovascular testing.    Please call with questions.  Damien JAYSON Braver, NP 10/19/2023, 2:01 PM

## 2023-10-19 NOTE — Telephone Encounter (Signed)
Patient called to follow-up on the status of her clearance.

## 2023-10-19 NOTE — Telephone Encounter (Signed)
 I s/w the pt and she has been made aware she has been cleared and notes have been faxed to Dr. Saintclair ok to proceed with procedure.

## 2023-11-26 NOTE — Progress Notes (Unsigned)
 Cardiology Office Note:    Date:  11/27/2023   ID:  Marilyn Dalton, DOB [birth year], MRN [id number]  PCP:  Chrystal Lamarr RAMAN, MD   [facility] Providers Cardiologist:  Shelda Bruckner, MD     Referring MD: Chrystal Lamarr RAMAN, *   Chief complaint: 43-month follow-up  History of Present Illness:    Marilyn Dalton is a 83 y.o. female with a hx of LBBB, mitral valve regurgitation, orthostatic hypotension, GERD, vertigo, breast cancer s/p right breast lumpectomy in 2016, arthritis, and tobacco use who presents for 16-month follow-up related to chest pain, DOE.  Evaluated by Dr. Bruckner in 2019.  Echo 10/10/2017 showed EF 50-55%, normal LV function, no RWMA, G1 DD, normal RV systolic function, mild mitral valve regurg.  ED visit 09/04/2023 secondary to chest pressure X 1 month.  Negative troponin, normal BNP, CXR unremarkable.  EKG with LBBB, concern for diffuse ST elevation.  Outpatient follow-up with cardiology was recommended following cardiology consult with review by Dr. Anner.  GI cocktail improved her symptoms in the ED.  Evaluated in our office last 09/14/2023 with reports of a 2-26-month history of difficulty eating, intermittent vomiting, difficulty breathing, burning/tightness in her chest, worse after eating and drinking.  Coronary CTA and echocardiogram pursued for further risk stratification.  Coronary CTA 09/22/2023: Gas/fluid levels in esophagus with mild diffuse wall thickening, suggestive of esophagitis.  Emphysema.  Coronary calcium  score 663 (82nd percentile for age-sex-race matched controlled).  Total plaque volume 365 mm3, (43rd percentile for age-sex matched controls).  TPV is severe.  Moderate 50-69% stenosis in mid RCA.  Mild 25-49% stenosis in proximal/mid LAD and proximal RCA.  Minimal 0-24% stenosis in LM and proximal/mid LCfx.  Study sent to CT/FFR  CT coronary FFR 09/24/2023: LM no significant stenosis.  LAD 0.91 across lesion in proximal-mid  LAD, suggesting lesion is not functionally significant.  LCfx no significant stenosis.  CT FFR 0.97 across lesion in proximal RCA then followed to 0.91 across lesion in mid RCA, suggesting neither lesion is functionally significant.  CT FFR suggests nonobstructive CAD.  Echo 10/18/2023: LVEF 50-55%, LV low normal function, no RWMA, G1 DD.  Normal RV.  Trivial mitral valve regurgitation.  Normal AV valve.  RA pressure 3 mmHg.  Patient presents to the clinic today [place], appears stable from a cardiovascular standpoint.  Reports epigastric/chest pressure occurring with activity/eating, relieved with rest.  Associated SOB/DOE, with sensations of heart racing, mild diaphoresis, occasional lightheadedness, and pressure/pain radiating to back.  Does have a history of acid reflux, EGD 11/13/2023 revealed a 3 minimal ulcerations per patient, do not have access to this report in our EMR as it was performed through [facility] gastroenterology.  Denies weight gain and lower extremity edema.  Reports that she sleeps with 2 pillows under her head 2/2 GERD/acid reflux.  Reports she has been under a lot of stress over the last couple years after moving into a retirement home.  Has not been exercising like she used to, wishes to join the gym at her retirement community, states they need a medical clearance note prior to allowing her to use their facilities.  States she frequently takes a lot of NSAIDs to treat the pain from her Baker's cyst behind her left knee.  Past Medical History:  Diagnosis Date   Allergy    Arthritis    fingers   Breast cancer ([name]) 06/20/14   right breast   Diverticulitis    GERD (gastroesophageal reflux disease)  Personal history of chemotherapy    Personal history of radiation therapy     Past Surgical History:  Procedure Laterality Date   ABDOMINAL HYSTERECTOMY  ~ 40 years ago   APPENDECTOMY     BREAST BIOPSY Left 10/6/210   no malignancy,extensive stromal fibrosis   BREAST BIOPSY Right  06/20/14   invasive ductal ca,dcis    BREAST LUMPECTOMY Right    BREAST LUMPECTOMY WITH RADIOACTIVE SEED AND SENTINEL LYMPH NODE BIOPSY Right 12/02/2014   Procedure: RIGHT BREAST LUMPECTOMY WITH RADIOACTIVE SEED LOCALIZATION AND SENTINEL LYMPH NODE BIOPSY;  Surgeon: Morene Olives, MD;  Location: [facility];  Service: General;  Laterality: Right;   COLON SURGERY  10/13/08   cecum polyp=adenomatous ,no high grade dysplasia or invasive malignancy   HAND SURGERY     RIGHT   KNEE ARTHROSCOPY     RT   PORTACATH PLACEMENT Left 07/11/2014   Procedure: INSERTION PORT-A-CATH;  Surgeon: Morene Olives, MD;  Location: [facility];  Service: General;  Laterality: Left;   TONSILLECTOMY      Current Medications: Current Meds  Medication Sig   metoprolol  succinate (TOPROL  XL) 25 MG 24 hr tablet Take 1 tablet (25 mg total) by mouth daily.   pantoprazole  (PROTONIX ) 20 MG tablet Take 1 tablet (20 mg total) by mouth daily for 14 days.   rosuvastatin  (CRESTOR ) 10 MG tablet Take 1 tablet (10 mg total) by mouth daily.   [DISCONTINUED] metoprolol  tartrate (LOPRESSOR ) 100 MG tablet Take 1 tablet 2 hours prior to cardiac ct     Allergies:   Patient has no known allergies.   Social History   Socioeconomic History   Marital status: Married    Spouse name: Not on file   Number of children: Not on file   Years of education: Not on file   Highest education level: Not on file  Occupational History   Not on file  Tobacco Use   Smoking status: Every Day    Current packs/day: 0.50    Average packs/day: 0.5 packs/day for 56.0 years (28.0 ttl pk-yrs)    Types: Cigarettes   Smokeless tobacco: Never  Vaping Use   Vaping status: Never Used  Substance and Sexual Activity   Alcohol  use: Yes    Comment: OCCASIONAL   Drug use: No   Sexual activity: Yes  Other Topics Concern   Not on file  Social History Narrative   Not on file   Social Drivers of Health   Financial Resource Strain: Not on  file  Food Insecurity: Not on file  Transportation Needs: Not on file  Physical Activity: Not on file  Stress: Not on file  Social Connections: Not on file     Family History: The patient's family history includes CVA in her mother.  ROS:   Please see the history of present illness.     All other systems reviewed and are negative.  EKGs/Labs/Other Studies Reviewed:    The following studies were reviewed today:  EKG Interpretation Date/Time:  Monday November 27 2023 11:29:53 EDT Ventricular Rate:  78 PR Interval:  154 QRS Duration:  136 QT Interval:  424 QTC Calculation: 483 R Axis:   -63  Text Interpretation: Normal sinus rhythm Right atrial enlargement Left axis deviation Left bundle branch block No significant change since last tracing When compared with ECG of 14-Sep-2023 10:00, Left bundle branch block is now Present Minimal criteria for Septal infarct are no longer Present Confirmed by Juniper Cobey [phone]) on 11/27/2023 11:33:10  AM    Recent Labs: 09/04/2023: ALT 8; BUN 9; Creatinine, Ser 0.70; Hemoglobin 15.4; Platelets 272; Potassium 4.0; Pro Brain Natriuretic Peptide 214.0; Sodium 141  Recent Lipid Panel    Component Value Date/Time   CHOL 210 (H) 09/05/2019 0942   TRIG 91.0 09/05/2019 0942   HDL 85.50 09/05/2019 0942   CHOLHDL 2 09/05/2019 0942   VLDL 18.2 09/05/2019 0942   LDLCALC 106 (H) 09/05/2019 0942    Physical Exam:    VS:  BP 137/80 (BP Location: Left Arm, Patient Position: Sitting, Cuff Size: Normal)   Pulse 89   Ht 5' 5 (1.651 m)   Wt 107 lb (48.5 kg)   SpO2 99%   BMI 17.81 kg/m     Wt Readings from Last 3 Encounters:  11/27/23 107 lb (48.5 kg)  09/14/23 107 lb 9.6 oz (48.8 kg)  09/04/23 107 lb (48.5 kg)     GEN:  Well nourished, well developed in no acute distress HEENT: Normal NECK:  No carotid bruits CARDIAC: RRR, no murmurs, rubs, gallops RESPIRATORY:  Clear to auscultation without rales, wheezing or rhonchi  MUSCULOSKELETAL:   No edema; No deformity  SKIN: Warm and dry NEUROLOGIC:  Alert and oriented x 3 PSYCHIATRIC:  Normal affect   ASSESSMENT:    1. Coronary artery disease involving native coronary artery of native heart, unspecified whether angina present   2. Nonrheumatic mitral valve regurgitation   3. Orthostatic hypotension   4. Gastroesophageal reflux disease with esophagitis without hemorrhage   5. Palpitations    PLAN:    In order of problems listed above:  Coronary artery disease Palpitations EKG: NSR, 78 bpm, RA enlargement, Left axis deviation, LBBB, no significant change since prior study Coronary CTA 09/22/18/2025: Coronary calcium  663.  Total plaque volume severe.  Suggesting multivessel plaque, CT sent to FFR.  Noncardiac findings include possible esophagitis and emphysema. Coronary CTA FFR 09/24/2023 suggest nonobstructive CAD Reports epigastric/chest pressure w/ associated palpitations and SOB exacerbated on exertion/eating, relieved with rest Discuss with Dr. Anner risk versus benefit of cardiac cath secondary to recent coronary CTA/EGD findings and symptoms. He reviewed her CTA, does not believe cardiac catheterization would be beneficial at this time. He suspects symptoms likely microvascular disease. Recommended medical therapy. Discussed importance of limiting NSAID therapies for pain control, recommended she may need to follow up with PCP/ortho for intervention on bakers cyst. Recommended they be taken with food.  Will order zio monitoring for 1 week to evaluate symptoms of palpitations/fast heart rate Start aspirin  EC 81 mg daily Start Toprol  XL 25 mg daily Continue crestor  10 mg daily Continue pantoprazole  as prescribed by GI If patient unable to tolerate Toprol  XL, Diltiazem 120 mg daily could be tried next for symptom relief  Mitral valve regurgitation Echo 10/18/2023 shows trivial MV regurg Will continue to monitor  GERD EGD report not available in our EMR system as it was  performed w[facility] Patient states 3 small ulcerations observed which were unlikely to be the cause of her symptoms Describes using aspirin  regularly to treat her knee pain. Recommended using enteric coated aspirin  and making sure she's taking with food. Continue follow up with GI for symptoms of indigestion Continue Protonix  as prescribed by GI  Follow up in 3 months  Medication Adjustments/Labs and Tests Ordered: Current medicines are reviewed at length with the patient today.  Concerns regarding medicines are outlined above.  Orders Placed This Encounter  Procedures   LONG TERM MONITOR (3-14 DAYS)  EKG 12-Lead   Meds ordered this encounter  Medications   metoprolol  succinate (TOPROL  XL) 25 MG 24 hr tablet    Sig: Take 1 tablet (25 mg total) by mouth daily.    Dispense:  90 tablet    Refill:  3    Patient Instructions  Medication Instructions:  Start Toprol  XL 25 mg daily Start Asprin EC 81 mg daily  *If you need a refill on your cardiac medications before your next appointment, please call your pharmacy*  Lab Work: NONE ordered at this time of appointment   Testing/Procedures: GEOFFRY HEWS- Long Term Monitor Instructions  Your physician has requested you wear a ZIO patch monitor for 7 days.  This is a single patch monitor. [employer] supplies one patch monitor per enrollment. Additional stickers are not available. Please do not apply patch if you will be having a Nuclear Stress Test,  Echocardiogram, Cardiac CT, MRI, or Chest Xray during the period you would be wearing the  monitor. The patch cannot be worn during these tests. You cannot remove and re-apply the  ZIO XT patch monitor.  Your ZIO patch monitor will be mailed 3 day USPS to your address on file. It may take 3-5 days  to receive your monitor after you have been enrolled.  Once you have received your monitor, please review the enclosed instructions. Your monitor  has already been registered assigning a specific  monitor serial # to you.  Billing and Patient Assistance Program Information  We have supplied [employer] with any of your insurance information on file for billing purposes. [employer] offers a sliding scale Patient Assistance Program for patients that do not have  insurance, or whose insurance does not completely cover the cost of the ZIO monitor.  You must apply for the Patient Assistance Program to qualify for this discounted rate.  To apply, please call [employer] at [phone], select option 4, select option 2, ask to apply for  Patient Assistance Program. Meredeth will ask your household income, and how many people  are in your household. They will quote your out-of-pocket cost based on that information.  [employer] will also be able to set up a 56-month, interest-free payment plan if needed.  Applying the monitor   Shave hair from upper left chest.  Hold abrader disc by orange tab. Rub abrader in 40 strokes over the upper left chest as  indicated in your monitor instructions.  Clean area with 4 enclosed alcohol  pads. Let dry.  Apply patch as indicated in monitor instructions. Patch will be placed under collarbone on left  side of chest with arrow pointing upward.  Rub patch adhesive wings for 2 minutes. Remove white label marked 1. Remove the white  label marked 2. Rub patch adhesive wings for 2 additional minutes.  While looking in a mirror, press and release button in center of patch. A small green light will  flash 3-4 times. This will be your only indicator that the monitor has been turned on.  Do not shower for the first 24 hours. You may shower after the first 24 hours.  Press the button if you feel a symptom. You will hear a small click. Record Date, Time and  Symptom in the Patient Logbook.  When you are ready to remove the patch, follow instructions on the last 2 pages of Patient  Logbook. Stick patch monitor onto the last page of Patient Logbook.  Place Patient Logbook in the  blue and white box. Use locking tab on box  and tape box closed  securely. The blue and white box has prepaid postage on it. Please place it in the mailbox as  soon as possible. Your physician should have your test results approximately 7 days after the  monitor has been mailed back to [facility].  Call [facility] Customer Care at [phone] if you have questions regarding  your ZIO XT patch monitor. Call them immediately if you see an orange light blinking on your  monitor.  If your monitor falls off in less than 4 days, contact our Monitor department at [phone].  If your monitor becomes loose or falls off after 4 days call [employer] at [phone] for  suggestions on securing your monitor   Follow-Up: At [facility], you and your health needs are our priority.  As part of our continuing mission to provide you with exceptional heart care, our providers are all part of one team.  This team includes your primary Cardiologist (physician) and Advanced Practice Providers or APPs (Physician Assistants and Nurse Practitioners) who all work together to provide you with the care you need, when you need it.  Your next appointment:   3 month(s)  Provider:   Shelda Bruckner, MD    We recommend signing up for the patient portal called MyChart.  Sign up information is provided on this After Visit Summary.  MyChart is used to connect with patients for Virtual Visits (Telemedicine).  Patients are able to view lab/test results, encounter notes, upcoming appointments, etc.  Non-urgent messages can be sent to your provider as well.   To learn more about what you can do with MyChart, go to [email or web address].            Signed, Miriam FORBES Shams, NP  11/27/2023 1:08 PM    [facility]

## 2023-11-27 ENCOUNTER — Ambulatory Visit: Attending: Nurse Practitioner | Admitting: Emergency Medicine

## 2023-11-27 ENCOUNTER — Ambulatory Visit: Attending: Emergency Medicine

## 2023-11-27 ENCOUNTER — Encounter: Payer: Self-pay | Admitting: Nurse Practitioner

## 2023-11-27 VITALS — BP 137/80 | HR 89 | Ht 65.0 in | Wt 107.0 lb

## 2023-11-27 DIAGNOSIS — R002 Palpitations: Secondary | ICD-10-CM | POA: Diagnosis present

## 2023-11-27 DIAGNOSIS — I2510 Atherosclerotic heart disease of native coronary artery without angina pectoris: Secondary | ICD-10-CM | POA: Diagnosis not present

## 2023-11-27 DIAGNOSIS — K2100 Gastro-esophageal reflux disease with esophagitis, without bleeding: Secondary | ICD-10-CM | POA: Insufficient documentation

## 2023-11-27 DIAGNOSIS — I340 Nonrheumatic mitral (valve) insufficiency: Secondary | ICD-10-CM | POA: Insufficient documentation

## 2023-11-27 MED ORDER — METOPROLOL SUCCINATE ER 25 MG PO TB24: 25.0000 mg | ORAL_TABLET | Freq: Every day | ORAL | 3 refills | Status: AC

## 2023-11-27 NOTE — Patient Instructions (Signed)
 Medication Instructions:  Start Toprol  XL 25 mg daily Start Asprin EC 81 mg daily  *If you need a refill on your cardiac medications before your next appointment, please call your pharmacy*  Lab Work: NONE ordered at this time of appointment   Testing/Procedures: GEOFFRY HEWS- Long Term Monitor Instructions  Your physician has requested you wear a ZIO patch monitor for 7 days.  This is a single patch monitor. [employer] supplies one patch monitor per enrollment. Additional stickers are not available. Please do not apply patch if you will be having a Nuclear Stress Test,  Echocardiogram, Cardiac CT, MRI, or Chest Xray during the period you would be wearing the  monitor. The patch cannot be worn during these tests. You cannot remove and re-apply the  ZIO XT patch monitor.  Your ZIO patch monitor will be mailed 3 day USPS to your address on file. It may take 3-5 days  to receive your monitor after you have been enrolled.  Once you have received your monitor, please review the enclosed instructions. Your monitor  has already been registered assigning a specific monitor serial # to you.  Billing and Patient Assistance Program Information  We have supplied [employer] with any of your insurance information on file for billing purposes. [employer] offers a sliding scale Patient Assistance Program for patients that do not have  insurance, or whose insurance does not completely cover the cost of the ZIO monitor.  You must apply for the Patient Assistance Program to qualify for this discounted rate.  To apply, please call [employer] at [phone], select option 4, select option 2, ask to apply for  Patient Assistance Program. Meredeth will ask your household income, and how many people  are in your household. They will quote your out-of-pocket cost based on that information.  [employer] will also be able to set up a 23-month, interest-free payment plan if needed.  Applying the monitor   Shave hair from upper  left chest.  Hold abrader disc by orange tab. Rub abrader in 40 strokes over the upper left chest as  indicated in your monitor instructions.  Clean area with 4 enclosed alcohol  pads. Let dry.  Apply patch as indicated in monitor instructions. Patch will be placed under collarbone on left  side of chest with arrow pointing upward.  Rub patch adhesive wings for 2 minutes. Remove white label marked 1. Remove the white  label marked 2. Rub patch adhesive wings for 2 additional minutes.  While looking in a mirror, press and release button in center of patch. A small green light will  flash 3-4 times. This will be your only indicator that the monitor has been turned on.  Do not shower for the first 24 hours. You may shower after the first 24 hours.  Press the button if you feel a symptom. You will hear a small click. Record Date, Time and  Symptom in the Patient Logbook.  When you are ready to remove the patch, follow instructions on the last 2 pages of Patient  Logbook. Stick patch monitor onto the last page of Patient Logbook.  Place Patient Logbook in the blue and white box. Use locking tab on box and tape box closed  securely. The blue and white box has prepaid postage on it. Please place it in the mailbox as  soon as possible. Your physician should have your test results approximately 7 days after the  monitor has been mailed back to [facility].  Call [facility] Customer Care at  [phone] if you have questions regarding  your ZIO XT patch monitor. Call them immediately if you see an orange light blinking on your  monitor.  If your monitor falls off in less than 4 days, contact our Monitor department at [phone].  If your monitor becomes loose or falls off after 4 days call [employer] at [phone] for  suggestions on securing your monitor   Follow-Up: At [facility], you and your health needs are our priority.  As part of our continuing mission to provide you  with exceptional heart care, our providers are all part of one team.  This team includes your primary Cardiologist (physician) and Advanced Practice Providers or APPs (Physician Assistants and Nurse Practitioners) who all work together to provide you with the care you need, when you need it.  Your next appointment:   3 month(s)  Provider:   Shelda Bruckner, MD    We recommend signing up for the patient portal called MyChart.  Sign up information is provided on this After Visit Summary.  MyChart is used to connect with patients for Virtual Visits (Telemedicine).  Patients are able to view lab/test results, encounter notes, upcoming appointments, etc.  Non-urgent messages can be sent to your provider as well.   To learn more about what you can do with MyChart, go to [email or web address].

## 2023-11-27 NOTE — Progress Notes (Unsigned)
 Enrolled for [employer] to mail a ZIO XT long term holter monitor to the patients address on file.   Dr. Jodelle Red to read.

## 2024-01-11 DIAGNOSIS — R002 Palpitations: Secondary | ICD-10-CM | POA: Diagnosis not present

## 2024-01-12 ENCOUNTER — Ambulatory Visit: Payer: Self-pay | Admitting: Emergency Medicine

## 2024-01-12 NOTE — Telephone Encounter (Signed)
 Patient returned staff call regarding results.

## 2024-01-12 NOTE — Telephone Encounter (Signed)
 The patient has been notified of the result and verbalized understanding.  All questions (if any) were answered. Tanda Petri, RN 01/12/2024 3:17 PM

## 2024-01-26 ENCOUNTER — Ambulatory Visit (HOSPITAL_BASED_OUTPATIENT_CLINIC_OR_DEPARTMENT_OTHER)
Admission: RE | Admit: 2024-01-26 | Discharge: 2024-01-26 | Disposition: A | Source: Ambulatory Visit | Attending: Family Medicine

## 2024-01-26 DIAGNOSIS — M8589 Other specified disorders of bone density and structure, multiple sites: Secondary | ICD-10-CM

## 2024-01-31 ENCOUNTER — Encounter (HOSPITAL_BASED_OUTPATIENT_CLINIC_OR_DEPARTMENT_OTHER): Payer: Self-pay | Admitting: Cardiology

## 2024-03-18 NOTE — Progress Notes (Unsigned)
 " Cardiology Office Note:    Date:  03/20/2024   ID:  Marilyn Dalton, DOB 1940/12/14, MRN [id number]  PCP:  Chrystal Lamarr RAMAN, MD   [facility] Providers Cardiologist:  Shelda Bruckner, MD     Referring MD: Chrystal Lamarr RAMAN, MD   Chief complaint: Follow-up chest pain and palpitations     History of Present Illness:   Marilyn Dalton is a 84 y.o. female with a hx of  LBBB, mitral valve regurgitation, orthostatic hypotension, GERD, vertigo, breast cancer s/p right breast lumpectomy in 2016, arthritis, and tobacco use who presents for 64-month follow-up related to chest pain and palpitations.   Evaluated by Dr. Bruckner in 2019.  Echo 10/10/2017 showed EF 50-55%, normal LV function, no RWMA, G1 DD, normal RV systolic function, mild mitral valve regurg.   ED visit 09/04/2023 secondary to chest pressure X 1 month.  Negative troponin, normal BNP, CXR unremarkable.  EKG with LBBB, concern for diffuse ST elevation.  Outpatient follow-up with cardiology was recommended following cardiology consult with review by Dr. Anner.  GI cocktail improved her symptoms in the ED.  Coronary CTA 09/22/2023: Gas/fluid levels in esophagus with mild diffuse wall thickening, suggestive of esophagitis.  Emphysema.  Coronary calcium  score 663 (82nd percentile for age-sex-race matched controlled).  Total plaque volume 365 mm3, (43rd percentile for age-sex matched controls).  TPV is severe.  Moderate 50-69% stenosis in mid RCA.  Mild 25-49% stenosis in proximal/mid LAD and proximal RCA.  Minimal 0-24% stenosis in LM and proximal/mid LCfx.  Study sent to CT/FFR   CT coronary FFR 09/24/2023: LM no significant stenosis.  LAD 0.91 across lesion in proximal-mid LAD, suggesting lesion is not functionally significant.  LCfx no significant stenosis.  CT FFR 0.97 across lesion in proximal RCA then followed to 0.91 across lesion in mid RCA, suggesting neither lesion is functionally significant.  CT FFR  suggests nonobstructive CAD.   Echo 10/18/2023: LVEF 50-55%, LV low normal function, no RWMA, G1 DD.  Normal RV.  Trivial mitral valve regurgitation.  Normal AV valve.  RA pressure 3 mmHg.  Last evaluated by myself in 11/2023, medical therapy pursued for persistent symptoms epigastric/chest pressure.  Event monitor performed for complaints of palpitations.  Cardiac event monitoring demonstrated predominantly sinus rhythm, 1 run of SVT lasting 4 beats, without arrhythmia.  Presents independently, appears stable from a cardiovascular standpoint. She denies chest pain, palpitations, dyspnea, orthopnea, n, v, dark/tarry/bloody stools, hematuria, dizziness, syncope, edema, weight gain.  Reports her palpitations have resolved since her last visit.  She slipped on the ice while walking her dog Monday, fell, hit her face, broke her glasses, bruised nasal bone and across left cheek, scraped left face.  Has not received medical care for this.  BP elevated in clinic at 162/80, does not regularly check her blood pressure, does not own a BP cuff.  Denies headache, blurred vision, neck pain, nausea, vomiting, loss of consciousness, numbness, tingling, weakness, slurred speech.  ROS:   Please see the history of present illness.    All other systems reviewed and are negative.     Past Medical History:  Diagnosis Date   Allergy    Arthritis    fingers   Breast cancer ([name]) 06/20/14   right breast   Diverticulitis    GERD (gastroesophageal reflux disease)    Personal history of chemotherapy    Personal history of radiation therapy     Past Surgical History:  Procedure Laterality Date   ABDOMINAL  HYSTERECTOMY  ~ 40 years ago   APPENDECTOMY     BREAST BIOPSY Left 10/6/210   no malignancy,extensive stromal fibrosis   BREAST BIOPSY Right 06/20/14   invasive ductal ca,dcis    BREAST LUMPECTOMY Right    BREAST LUMPECTOMY WITH RADIOACTIVE SEED AND SENTINEL LYMPH NODE BIOPSY Right 12/02/2014   Procedure:  RIGHT BREAST LUMPECTOMY WITH RADIOACTIVE SEED LOCALIZATION AND SENTINEL LYMPH NODE BIOPSY;  Surgeon: Morene Olives, MD;  Location: [facility];  Service: General;  Laterality: Right;   COLON SURGERY  10/13/08   cecum polyp=adenomatous ,no high grade dysplasia or invasive malignancy   HAND SURGERY     RIGHT   KNEE ARTHROSCOPY     RT   PORTACATH PLACEMENT Left 07/11/2014   Procedure: INSERTION PORT-A-CATH;  Surgeon: Morene Olives, MD;  Location: [facility];  Service: General;  Laterality: Left;   TONSILLECTOMY      Current Medications: Active Medications[1]   Allergies:   Patient has no known allergies.   Social History   Socioeconomic History   Marital status: Married    Spouse name: Not on file   Number of children: Not on file   Years of education: Not on file   Highest education level: Not on file  Occupational History   Not on file  Tobacco Use   Smoking status: Every Day    Current packs/day: 0.50    Average packs/day: 0.5 packs/day for 56.0 years (28.0 ttl pk-yrs)    Types: Cigarettes   Smokeless tobacco: Never  Vaping Use   Vaping status: Never Used  Substance and Sexual Activity   Alcohol  use: Yes    Comment: OCCASIONAL   Drug use: No   Sexual activity: Yes  Other Topics Concern   Not on file  Social History Narrative   Not on file   Social Drivers of Health   Tobacco Use: High Risk (03/20/2024)   Patient History    Smoking Tobacco Use: Every Day    Smokeless Tobacco Use: Never    Passive Exposure: Not on file  Financial Resource Strain: Not on file  Food Insecurity: Not on file  Transportation Needs: Not on file  Physical Activity: Not on file  Stress: Not on file  Social Connections: Not on file  Depression (EYV7-0): Not on file  Alcohol  Screen: Not on file  Housing: Not on file  Utilities: Not on file  Health Literacy: Not on file     Family History: The patient's family history includes CVA in her mother.  EKGs/Labs/Other  Studies Reviewed:    The following studies were reviewed today:       Recent Labs: 09/04/2023: ALT 8; BUN 9; Creatinine, Ser 0.70; Hemoglobin 15.4; Platelets 272; Potassium 4.0; Pro Brain Natriuretic Peptide 214.0; Sodium 141  Recent Lipid Panel    Component Value Date/Time   CHOL 210 (H) 09/05/2019 0942   TRIG 91.0 09/05/2019 0942   HDL 85.50 09/05/2019 0942   CHOLHDL 2 09/05/2019 0942   VLDL 18.2 09/05/2019 0942   LDLCALC 106 (H) 09/05/2019 0942     Risk Assessment/Calculations:      HYPERTENSION CONTROL Vitals:   03/20/24 1135 03/20/24 1210  BP: (!) 162/80 (!) 156/72    The patient's blood pressure is elevated above target today.  In order to address the patient's elevated BP: A new medication was prescribed today.; Blood pressure will be monitored at home to determine if medication changes need to be made.  Physical Exam:    VS:  BP (!) 156/72 (BP Location: Right Arm)   Pulse 76   Ht 5' 5 (1.651 m)   Wt 108 lb 12.8 oz (49.4 kg)   SpO2 100%   BMI 18.11 kg/m        Wt Readings from Last 3 Encounters:  03/20/24 108 lb 12.8 oz (49.4 kg)  11/27/23 107 lb (48.5 kg)  09/14/23 107 lb 9.6 oz (48.8 kg)     GEN:  Well nourished, well developed in no acute distress HEENT: Normal NECK: No carotid bruits CARDIAC:  S1-S2 normal, RRR, no murmurs, rubs, gallops RESPIRATORY:  Clear to auscultation without rales, wheezing or rhonchi  MUSCULOSKELETAL:  No edema; No deformity  SKIN: Warm and dry, visible bruising bilateral nasal bridge, left cheek, periorbital area nontender to touch NEUROLOGIC:  Alert and oriented x 3, sensation equal bilaterally, upper extremity/lower extremity strength equal/intact bilaterally, no obvious slurred speech, motor deficits, gait abnormalities PSYCHIATRIC:  Normal affect and mood      Assessment & Plan Fall, initial encounter Injury of head, initial encounter Patient slipped and fell on ice while walking her dog Monday  night. Hit her face, broke her glasses, denies LOC, headache, nausea, neck pain, blurred vision, or other associated neurologic signs/symptoms as mentioned above. She has not received any medical evaluation since then. She does have visible bruising across her nasal bridge and left cheek, sites are nontender to touch. She is on aspirin  therapy for her CAD BP mildly elevated today, patient states it could be 2/2 having recently smoked prior to arrival. Due to patient's age, aspirin  use, minor bruising, patient does meet criteria for further imaging Advised patient to proceed directly to the nearest emergency department, not interested in EMS transport, appears stable to go via POV. We discussed the risks of not seeking treatment, patient stated she understood, and would proceed there immediately Coronary artery disease involving native coronary artery of native heart, unspecified whether angina present Coronary CTA 09/22/2023: CAC 663 (82nd percentile), TPV 365 (43rd percentile), FFR without significant flow-limiting disease. No ischemic symptoms today, continue to monitor for new symptoms Continue aspirin  81 mg EC as long as imaging obtained in the emergency department is negative for acute abnormality.  Continue Crestor  10 mg daily  Primary hypertension BP in the 160s on arrival, 156 systolic on recheck BPs have been high on previous checks historically Continue metoprolol  succinate 25 mg daily Start amlodipine  2.5 mg daily Keep BP log to bring with you to next appointment Hyperlipidemia, unspecified hyperlipidemia type Lipid panel 2021: LDL 106 Will order direct LDL, and adjust Crestor  as needed to achieve LDL goal of <70 Continue Crestor  10 mg daily  Disposition: Proceed directly to the nearest emergency department for further workup/imaging.  Follow-up with general cardiology in 1 month for further hypertension management.           Medication Adjustments/Labs and Tests  Ordered: Current medicines are reviewed at length with the patient today.  Concerns regarding medicines are outlined above.  Orders Placed This Encounter  Procedures   Direct LDL   Meds ordered this encounter  Medications   amLODipine  (NORVASC ) 2.5 MG tablet    Sig: Take 1 tablet (2.5 mg total) by mouth daily.    Dispense:  90 tablet    Refill:  3    Patient Instructions  Medication Instructions:  Start Amlodipine  2.5 mg take one tablet daily *If you need a refill on your cardiac medications before your next appointment,  please call your pharmacy*  Lab Work: Today- Direct LDL If you have labs (blood work) drawn today and your tests are completely normal, you will receive your results only by: MyChart Message (if you have MyChart) OR A paper copy in the mail If you have any lab test that is abnormal or we need to change your treatment, we will call you to review the results.   Follow-Up: At [facility], you and your health needs are our priority.  As part of our continuing mission to provide you with exceptional heart care, our providers are all part of one team.  This team includes your primary Cardiologist (physician) and Advanced Practice Providers or APPs (Physician Assistants and Nurse Practitioners) who all work together to provide you with the care you need, when you need it.  Your next appointment:   1 month(s)  Provider:   Miriam Shams, NP          We recommend signing up for the patient portal called MyChart.  Sign up information is provided on this After Visit Summary.  MyChart is used to connect with patients for Virtual Visits (Telemedicine).  Patients are able to view lab/test results, encounter notes, upcoming appointments, etc.  Non-urgent messages can be sent to your provider as well.   To learn more about what you can do with MyChart, go to [email or web address].   Other Instructions Monitor your blood pressure. Please let us  know if your  readings are consistently over 130/80  We need to get a better idea of what your blood pressure is running at home. Here are some instructions to follow: - I would recommend using a blood pressure cuff that goes on your arm. The wrist ones can be inaccurate. If you're purchasing one for the first time, try to select one that also reports your heart rate because this can be helpful information as well. - To check your blood pressure, choose a time at least 3 hours after taking your blood pressure medicines. If you can sample it at different times of the day, that's great - it might give you more information about how your blood pressure fluctuates. Remain seated in a chair for 5 minutes quietly beforehand, then check it.  - Please record a list of those readings and call us /send in MyChart message            Signed, Lorene Samaan E Vadis Slabach, NP  03/20/2024 12:29 PM    [facility]     [1]  Current Meds  Medication Sig   amLODipine  (NORVASC ) 2.5 MG tablet Take 1 tablet (2.5 mg total) by mouth daily.   aspirin  EC 81 MG tablet Take 81 mg by mouth daily. Swallow whole.   metoprolol  succinate (TOPROL  XL) 25 MG 24 hr tablet Take 1 tablet (25 mg total) by mouth daily.   pantoprazole  (PROTONIX ) 20 MG tablet Take 1 tablet (20 mg total) by mouth daily for 14 days.   rosuvastatin  (CRESTOR ) 10 MG tablet Take 1 tablet (10 mg total) by mouth daily.   "

## 2024-03-20 ENCOUNTER — Emergency Department (HOSPITAL_BASED_OUTPATIENT_CLINIC_OR_DEPARTMENT_OTHER)
Admission: EM | Admit: 2024-03-20 | Discharge: 2024-03-20 | Disposition: A | Discharge status: Home / Self Care | Source: Ambulatory Visit | Attending: Emergency Medicine | Admitting: Emergency Medicine

## 2024-03-20 ENCOUNTER — Other Ambulatory Visit: Payer: Self-pay

## 2024-03-20 ENCOUNTER — Encounter: Payer: Self-pay | Admitting: Emergency Medicine

## 2024-03-20 ENCOUNTER — Ambulatory Visit: Attending: Cardiovascular Disease | Admitting: Emergency Medicine

## 2024-03-20 ENCOUNTER — Emergency Department (HOSPITAL_BASED_OUTPATIENT_CLINIC_OR_DEPARTMENT_OTHER)

## 2024-03-20 ENCOUNTER — Encounter (HOSPITAL_BASED_OUTPATIENT_CLINIC_OR_DEPARTMENT_OTHER): Payer: Self-pay | Admitting: Emergency Medicine

## 2024-03-20 VITALS — BP 156/72 | HR 76 | Ht 65.0 in | Wt 108.8 lb

## 2024-03-20 DIAGNOSIS — S0990XA Unspecified injury of head, initial encounter: Secondary | ICD-10-CM | POA: Insufficient documentation

## 2024-03-20 DIAGNOSIS — W000XXA Fall on same level due to ice and snow, initial encounter: Secondary | ICD-10-CM | POA: Insufficient documentation

## 2024-03-20 DIAGNOSIS — Z853 Personal history of malignant neoplasm of breast: Secondary | ICD-10-CM | POA: Insufficient documentation

## 2024-03-20 DIAGNOSIS — W19XXXA Unspecified fall, initial encounter: Secondary | ICD-10-CM

## 2024-03-20 DIAGNOSIS — E785 Hyperlipidemia, unspecified: Secondary | ICD-10-CM | POA: Diagnosis not present

## 2024-03-20 DIAGNOSIS — I2510 Atherosclerotic heart disease of native coronary artery without angina pectoris: Secondary | ICD-10-CM | POA: Diagnosis not present

## 2024-03-20 DIAGNOSIS — Z7982 Long term (current) use of aspirin: Secondary | ICD-10-CM | POA: Insufficient documentation

## 2024-03-20 DIAGNOSIS — Z720 Tobacco use: Secondary | ICD-10-CM | POA: Insufficient documentation

## 2024-03-20 DIAGNOSIS — I10 Essential (primary) hypertension: Secondary | ICD-10-CM | POA: Insufficient documentation

## 2024-03-20 MED ORDER — AMLODIPINE BESYLATE 2.5 MG PO TABS: 2.5000 mg | ORAL_TABLET | Freq: Every day | ORAL | 3 refills | Status: AC

## 2024-03-20 NOTE — Discharge Instructions (Signed)
 It was a pleasure taking care of you today.  As discussed, your CT images were negative for any acute abnormalities.  You may take over-the-counter ibuprofen or Tylenol  as needed for pain.  Return to the ER for any worsening symptoms.

## 2024-03-20 NOTE — Patient Instructions (Addendum)
 Proceed directly to the nearest emergency department for further CT imaging of head, face, neck after recent mechanical fall.  The remainder of these instructions are to be followed if felt to be stable for discharge from emergency department.  Medication Instructions:  Start Amlodipine  2.5 mg take one tablet daily *If you need a refill on your cardiac medications before your next appointment, please call your pharmacy*  Lab Work: Today- Direct LDL If you have labs (blood work) drawn today and your tests are completely normal, you will receive your results only by: MyChart Message (if you have MyChart) OR A paper copy in the mail If you have any lab test that is abnormal or we need to change your treatment, we will call you to review the results.   Follow-Up: At [facility], you and your health needs are our priority.  As part of our continuing mission to provide you with exceptional heart care, our providers are all part of one team.  This team includes your primary Cardiologist (physician) and Advanced Practice Providers or APPs (Physician Assistants and Nurse Practitioners) who all work together to provide you with the care you need, when you need it.  Your next appointment:   1 month(s)  Provider:   Miriam Shams, NP          We recommend signing up for the patient portal called MyChart.  Sign up information is provided on this After Visit Summary.  MyChart is used to connect with patients for Virtual Visits (Telemedicine).  Patients are able to view lab/test results, encounter notes, upcoming appointments, etc.  Non-urgent messages can be sent to your provider as well.   To learn more about what you can do with MyChart, go to [email or web address].   Other Instructions Monitor your blood pressure. Please let us  know if your readings are consistently over 130/80  We need to get a better idea of what your blood pressure is running at home. Here are some instructions to  follow: - I would recommend using a blood pressure cuff that goes on your arm. The wrist ones can be inaccurate. If you're purchasing one for the first time, try to select one that also reports your heart rate because this can be helpful information as well. - To check your blood pressure, choose a time at least 3 hours after taking your blood pressure medicines. If you can sample it at different times of the day, that's great - it might give you more information about how your blood pressure fluctuates. Remain seated in a chair for 5 minutes quietly beforehand, then check it.  - Please record a list of those readings and call us /send in [employer]

## 2024-03-20 NOTE — ED Notes (Signed)
 Pt d/c instructions, medications, and follow-up care reviewed with pt. Pt verbalized understanding and had no further questions at time of d/c. Pt CA&Ox4, ambulatory, and in NAD at time of d/c

## 2024-03-20 NOTE — ED Triage Notes (Signed)
 Pt via pov from home after a fall Monday night. Pt was walking her dog and got pulled and fell. She hit her face. No LOC> Pt has bruising to her left eye and bridge of her nose as well as scratches on her left cheek. Pt went for cardiology checkup today and was told to come to ED for CT  scan. She is not on blood thinners. Pt a&o x 4; nad noted.

## 2024-03-20 NOTE — ED Provider Notes (Signed)
 " [place] EMERGENCY DEPARTMENT AT [facility] Provider Note   CSN: [id number] Arrival date & time: 03/20/24  1306     Patient presents with: Marilyn Dalton is a 84 y.o. female with a past medical history significant for history of breast cancer, GERD, and tobacco abuse who presents to the ED after a mechanical fall.  Patient notes she was walking her dog on Monday and slipped on ice falling forward and hitting her face.  No LOC.  Not on any blood thinners.  Patient was seen by cardiology today and was advised to report to the ED for CT images.  Patient denies any other injuries.  Denies headache.  No visual or speech changes.  Denies unilateral weakness.  Denies dizziness.  History obtained from patient and past medical records. No interpreter used during encounter.       Prior to Admission medications  Medication Sig Start Date End Date Taking? Authorizing Provider  amLODipine  (NORVASC ) 2.5 MG tablet Take 1 tablet (2.5 mg total) by mouth daily. 03/20/24 06/18/24  Campbell, Kenzie E, NP  aspirin  EC 81 MG tablet Take 81 mg by mouth daily. Swallow whole.    [provider]  metoprolol  succinate (TOPROL  XL) 25 MG 24 hr tablet Take 1 tablet (25 mg total) by mouth daily. 11/27/23   Campbell, Kenzie E, NP  pantoprazole  (PROTONIX ) 20 MG tablet Take 1 tablet (20 mg total) by mouth daily for 14 days. 09/04/23 03/20/24  Neysa Caron PARAS, DO  rosuvastatin  (CRESTOR ) 10 MG tablet Take 1 tablet (10 mg total) by mouth daily. 09/26/23 03/20/24  Walker, Caitlin S, NP    Allergies: Patient has no known allergies.    Review of Systems  Eyes:  Negative for visual disturbance.  Respiratory:  Negative for shortness of breath.   Cardiovascular:  Negative for chest pain.  Gastrointestinal:  Negative for abdominal pain.  Neurological:  Negative for weakness and headaches.    Updated Vital Signs BP (!) 182/94 (BP Location: Right Arm)   Pulse 76   Temp 98.1 F (36.7 C) (Oral)    Resp 20   Ht 5' 5 (1.651 m)   Wt 49.4 kg   SpO2 100%   BMI 18.12 kg/m   Physical Exam Vitals and nursing note reviewed.  Constitutional:      General: She is not in acute distress.    Appearance: She is not ill-appearing.  HENT:     Head: Normocephalic.     Comments: Superficial abrasion to left cheek with surrounding ecchymosis.    Nose:     Comments: Ecchymosis to bridge of nose. No septal hematomas. Eyes:     Pupils: Pupils are equal, round, and reactive to light.  Neck:     Comments: No cervical midline tenderness Cardiovascular:     Rate and Rhythm: Normal rate and regular rhythm.     Pulses: Normal pulses.     Heart sounds: Normal heart sounds. No murmur heard.    No friction rub. No gallop.  Pulmonary:     Effort: Pulmonary effort is normal.     Breath sounds: Normal breath sounds.  Abdominal:     General: Abdomen is flat. There is no distension.     Palpations: Abdomen is soft.     Tenderness: There is no abdominal tenderness. There is no guarding or rebound.  Musculoskeletal:        General: Normal range of motion.     Cervical back: Neck supple.  Comments: No thoracic or lumbar midline tenderness  Skin:    General: Skin is warm and dry.  Neurological:     General: No focal deficit present.     Mental Status: She is alert.     Comments: Speech is clear, able to follow commands CN III-XII intact Normal strength in upper and lower extremities bilaterally including dorsiflexion and plantar flexion, strong and equal grip strength Sensation grossly intact throughout Moves extremities without ataxia, coordination intact Ambulates without difficulty  Psychiatric:        Mood and Affect: Mood normal.        Behavior: Behavior normal.     (all labs ordered are listed, but only abnormal results are displayed) Labs Reviewed - No data to display  EKG: None  Radiology: CT Maxillofacial Wo Contrast Result Date: 03/20/2024 EXAM: CT OF THE FACE WITHOUT  CONTRAST 03/20/2024 02:11:11 PM TECHNIQUE: CT of the face was performed without the administration of intravenous contrast. Multiplanar reformatted images are provided for review. Automated exposure control, iterative reconstruction, and/or weight based adjustment of the mA/kV was utilized to reduce the radiation dose to as low as reasonably achievable. COMPARISON: None available. CLINICAL HISTORY: Facial trauma, blunt. Recent fall with facial bruising. FINDINGS: FACIAL BONES: No acute facial fracture. No mandibular dislocation. No suspicious bone lesion. Absent maxillary dentition. Multiple caries involving remaining mandibular teeth. ORBITS: Globes are intact. No acute traumatic injury. No inflammatory change. Bilateral cataract extraction. SINUSES AND MASTOIDS: Clear paranasal sinuses, mastoid air cells, and middle ear cavities. SOFT TISSUES: Punctate calcification in the left parotid gland. IMPRESSION: 1. No acute facial fracture. Electronically signed by: Dasie Hamburg MD 03/20/2024 02:50 PM EST RP Workstation: [id number]   CT Cervical Spine Wo Contrast Result Date: 03/20/2024 EXAM: CT CERVICAL SPINE WITHOUT CONTRAST 03/20/2024 02:11:11 PM TECHNIQUE: CT of the cervical spine was performed without the administration of intravenous contrast. Multiplanar reformatted images are provided for review. Automated exposure control, iterative reconstruction, and/or weight based adjustment of the mA/kV was utilized to reduce the radiation dose to as low as reasonably achievable. COMPARISON: None available. CLINICAL HISTORY: Neck trauma. Recent fall with facial bruising. FINDINGS: BONES AND ALIGNMENT: Reversal of the normal cervical lordosis. Trace retrolisthesis of C6 on C7 and trace anterolisthesis of C7 on T1. No acute fracture or suspicious lesion. DEGENERATIVE CHANGES: Advanced disc degeneration at C5-C6 and C6-C7 with moderate right sided neural foraminal stenosis at both levels. No evidence of high grade spinal  canal stenosis. SOFT TISSUES: No prevertebral soft tissue swelling. Centrilobular and paraseptal emphysema. IMPRESSION: 1. No acute cervical spine fracture. Electronically signed by: Dasie Hamburg MD 03/20/2024 02:47 PM EST RP Workstation: [id number]   CT Head Wo Contrast Result Date: 03/20/2024 EXAM: CT HEAD WITHOUT CONTRAST 03/20/2024 02:11:11 PM TECHNIQUE: CT of the head was performed without the administration of intravenous contrast. Automated exposure control, iterative reconstruction, and/or weight based adjustment of the mA/kV was utilized to reduce the radiation dose to as low as reasonably achievable. COMPARISON: None available. CLINICAL HISTORY: Head trauma, minor (Age >= 65 years). Recent fall with facial bruising. FINDINGS: BRAIN AND VENTRICLES: There is no evidence of an acute infarct, intracranial hemorrhage, mass, midline shift, hydrocephalus, or extra-axial fluid collection. Mild cerebral atrophy is within normal limits for age. Hypodensities in the cerebral white matter are nonspecific but compatible with moderate chronic small vessel ischemic disease. Calcified atherosclerosis at the skull base. ORBITS: Reported on today's separate maxillofacial CT. SINUSES: Reported on today's separate maxillofacial CT. SOFT TISSUES AND SKULL: No acute  soft tissue abnormality. No skull fracture. IMPRESSION: 1. No acute intracranial abnormality. 2. Moderate chronic small vessel ischemic disease. Electronically signed by: Dasie Hamburg MD 03/20/2024 02:38 PM EST RP Workstation: [id number]     Procedures   Medications Ordered in the ED - No data to display                                  Medical Decision Making Amount and/or Complexity of Data Reviewed External Data Reviewed: notes.    Details: Reviewed cardiology note from earlier today Radiology: ordered and independent interpretation performed. Decision-making details documented in ED Course.   This patient presents to the ED for concern of  fall, this involves an extensive number of treatment options, and is a complaint that carries with it a high risk of complications and morbidity.  The differential diagnosis includes intracranial bleed, bony fracture, concussion, etc  84 year old female presents to the ED after a mechanical fall that occurred 2 days ago.  Patient was walking her dog and fell forward hitting the ice.  No LOC.  Not on any blood thinners.  Patient was evaluated by cardiology today who sent patient to the ED for CT imaging.  Patient is currently asymptomatic.  Denies headache, dizziness, visual changes, speech changes, nausea, and vomiting.  Upon arrival, stable vitals.  Patient well-appearing on exam.  Has ecchymosis to bridge of nose. No septal hematomas. Superficial abrasion to left cheek with surrounding ecchymosis.  Also has some superficial abrasions to right hand.  Normal neurological exam without any neurological deficits.  No cervical, thoracic, or lumbar midline tenderness.  No tenderness throughout wrist or hands.  CT imaging ordered in triage.  Patient declined tetanus booster.  CT head, cervical, and maxillofacial personally reviewed and interpreted which is negative for any acute abnormalities.  No intracranial bleed.  No bony fractures. No evidence of further injuries on exam. Patient stable for discharge. Strict ED precautions discussed with patient. Patient states understanding and agrees to plan. Patient discharged home in no acute distress and stable vitals.  Discussed with Dr. Dean who evaluated patient at bedside and agrees with assessment and plan.   Co morbidities that complicate the patient evaluation  Hx breast cancer  Social Determinants of Health:  Elderly >65  Test / Admission - Considered:  Considered admission; however CT negative.  No evidence of intracranial bleed.  No bony fractures.  Patient stable for discharge.       Final diagnoses:  Fall, initial encounter  Injury of  head, initial encounter    ED Discharge Orders     None          Lorelle Aleck JAYSON DEVONNA 03/20/24 1500    Dean Clarity, MD 03/20/24 1517  "

## 2024-03-21 ENCOUNTER — Ambulatory Visit: Payer: Self-pay | Admitting: Emergency Medicine

## 2024-03-21 LAB — LDL CHOLESTEROL, DIRECT: LDL Direct: 75 mg/dL (ref 0–99)

## 2024-03-21 NOTE — Telephone Encounter (Signed)
 Pt returning call regarding recent results, please advise.

## 2024-05-06 ENCOUNTER — Ambulatory Visit: Admitting: Cardiology
